# Patient Record
Sex: Female | Born: 1975 | Race: White | Hispanic: No | Marital: Single | Smoking: Former smoker
Health system: Southern US, Community
[De-identification: ages and names within clinical notes are randomized; demographics above are authoritative.]

## PROBLEM LIST (undated history)

## (undated) DIAGNOSIS — I10 Essential (primary) hypertension: Secondary | ICD-10-CM

## (undated) DIAGNOSIS — I509 Heart failure, unspecified: Secondary | ICD-10-CM

## (undated) DIAGNOSIS — E119 Type 2 diabetes mellitus without complications: Secondary | ICD-10-CM

---

## 2001-11-09 ENCOUNTER — Ambulatory Visit (HOSPITAL_COMMUNITY): Admission: RE | Admit: 2001-11-09 | Discharge: 2001-11-09 | Payer: Self-pay | Admitting: General Surgery

## 2011-11-23 ENCOUNTER — Other Ambulatory Visit: Payer: Self-pay | Admitting: Family Medicine

## 2011-11-23 DIAGNOSIS — R109 Unspecified abdominal pain: Secondary | ICD-10-CM

## 2011-11-29 ENCOUNTER — Ambulatory Visit (HOSPITAL_COMMUNITY)
Admission: RE | Admit: 2011-11-29 | Discharge: 2011-11-29 | Disposition: A | Discharge status: Home / Self Care | Payer: Self-pay | Source: Ambulatory Visit | Attending: Family Medicine | Admitting: Family Medicine

## 2011-11-29 DIAGNOSIS — R109 Unspecified abdominal pain: Secondary | ICD-10-CM | POA: Insufficient documentation

## 2011-11-29 DIAGNOSIS — R110 Nausea: Secondary | ICD-10-CM | POA: Insufficient documentation

## 2011-11-30 ENCOUNTER — Other Ambulatory Visit: Payer: Self-pay | Admitting: Family Medicine

## 2011-11-30 DIAGNOSIS — R109 Unspecified abdominal pain: Secondary | ICD-10-CM

## 2011-12-01 ENCOUNTER — Ambulatory Visit (HOSPITAL_COMMUNITY)
Admission: RE | Admit: 2011-12-01 | Discharge: 2011-12-01 | Disposition: A | Discharge status: Home / Self Care | Payer: Self-pay | Source: Ambulatory Visit | Attending: Family Medicine | Admitting: Family Medicine

## 2011-12-01 DIAGNOSIS — R109 Unspecified abdominal pain: Secondary | ICD-10-CM | POA: Insufficient documentation

## 2011-12-01 MED ORDER — SINCALIDE 5 MCG IJ SOLR
INTRAMUSCULAR | Status: AC
  Administered 2011-12-01: 1.59 ug via INTRAVENOUS
  Filled 2011-12-01: qty 5

## 2011-12-01 MED ORDER — TECHNETIUM TC 99M MEBROFENIN IV KIT
5.0000 | PACK | Freq: Once | INTRAVENOUS | Status: AC | PRN
  Administered 2011-12-01: 5 via INTRAVENOUS

## 2013-05-22 ENCOUNTER — Encounter: Payer: Self-pay | Admitting: Family Medicine

## 2013-05-22 ENCOUNTER — Ambulatory Visit (INDEPENDENT_AMBULATORY_CARE_PROVIDER_SITE_OTHER): Payer: Self-pay | Admitting: Family Medicine

## 2013-05-22 VITALS — BP 158/68 | Temp 98.4°F | Ht 69.0 in | Wt 186.0 lb

## 2013-05-22 DIAGNOSIS — L0390 Cellulitis, unspecified: Secondary | ICD-10-CM

## 2013-05-22 DIAGNOSIS — L0291 Cutaneous abscess, unspecified: Secondary | ICD-10-CM

## 2013-05-22 MED ORDER — HYDROCODONE-ACETAMINOPHEN 5-325 MG PO TABS: 1.0000 | ORAL_TABLET | Freq: Four times a day (QID) | ORAL | Status: DC | PRN

## 2013-05-22 MED ORDER — SULFAMETHOXAZOLE-TMP DS 800-160 MG PO TABS: 1.0000 | ORAL_TABLET | Freq: Two times a day (BID) | ORAL | Status: DC

## 2013-05-22 NOTE — Progress Notes (Signed)
   Subjective:    Patient ID: Christie Anderson, female    DOB: [birth year], 37 y.o.   MRN: [id number]  HPI Patient has a boil on the right side of her vagina.   Gets occasional skin infxns and cysts  Came up a couple wks ago, started like an ingrown hair  Turned into more larger  No drainage She said she first noticed it a week ago. It was the size of a pea, now it is the size of an egg.     Review of Systems No fever or chills or discharge    Objective:   Physical Exam  Exam reveals an abscess in the right inferior labial region. Patient was prepped draped anesthetized incised pus was expressed drain inserted      Assessment & Plan:  Impression cellulitis with abscess wound care discussed. Plan hydrocodone when necessary for pain. Bactrim DS twice a day. Symptomatic care discussed. WSL

## 2013-12-08 IMAGING — NM NM HEPATO W/GB/PHARM/[PERSON_NAME]
2 series · 12 of 12 positions shown · non-contrast
Comparison: Ultrasound 11/29/2011

CLINICAL DATA: Abdominal pain

NUCLEAR MEDICINE HEPATOBILIARY IMAGING WITH GALLBLADDER EF
TECHNIQUE: Sequential images of the abdomen were obtained [DATE]
minutes following intravenous administration of
radiopharmaceutical.  After slow intravenous infusion of 1.6 ucg
Cholecystokinin, gallbladder ejection fraction was determined.
Radiopharmaceutical:  5.0 mCi Lc-QQm Choletec

[hida · 3.20mm/px · 6 of 30 frames shown (1 of 2)]
[frame 3/30]
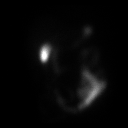
[frame 8/30]
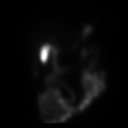
[frame 13/30]
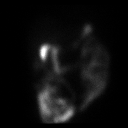
[frame 18/30]
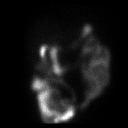
[frame 23/30]
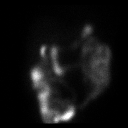
[frame 28/30]
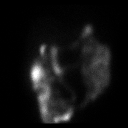

[hida · 3.20mm/px · 6 of 60 frames shown (2 of 2)]
[frame 6/60]
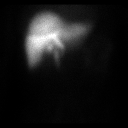
[frame 16/60]
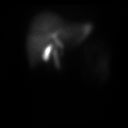
[frame 26/60]
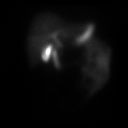
[frame 36/60]
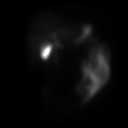
[frame 46/60]
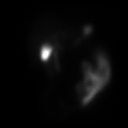
[frame 56/60]
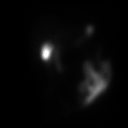

[12 of 12 positions shown; findings below may reference images not displayed]

FINDINGS: There is prompt extraction of radiotracer from the blood
pool and homogeneous uptake within the liver.  The gallbladder is
evident by 15 minutes.  Counts are present within the bowel by 20
minutes.  Upon administration of a cholecystokinin, the gallbladder
contracts appropriately with a calculated ejection fraction = 84%
at 30 min (Normal greater than 30 % ejection).
IMPRESSION: 1. Normal gallbladder ejection fraction =  84%.
2. Patent cystic duct and common bile duct.

## 2014-07-15 ENCOUNTER — Ambulatory Visit (INDEPENDENT_AMBULATORY_CARE_PROVIDER_SITE_OTHER): Payer: Self-pay | Admitting: Family Medicine

## 2014-07-15 ENCOUNTER — Encounter: Payer: Self-pay | Admitting: Family Medicine

## 2014-07-15 VITALS — BP 138/92 | Temp 99.2°F | Ht 69.0 in | Wt 185.0 lb

## 2014-07-15 DIAGNOSIS — J019 Acute sinusitis, unspecified: Secondary | ICD-10-CM

## 2014-07-15 DIAGNOSIS — B9689 Other specified bacterial agents as the cause of diseases classified elsewhere: Secondary | ICD-10-CM

## 2014-07-15 MED ORDER — LEVOFLOXACIN 500 MG PO TABS: 500.0000 mg | ORAL_TABLET | Freq: Every day | ORAL | Status: DC

## 2014-07-15 MED ORDER — SULFAMETHOXAZOLE-TRIMETHOPRIM 800-160 MG PO TABS: 1.0000 | ORAL_TABLET | Freq: Two times a day (BID) | ORAL | Status: DC

## 2014-07-15 NOTE — Progress Notes (Signed)
   Subjective:    Patient ID: Gevena BarreKimberly D Anderson, female    DOB: Oct 02, 1975, 39 y.o.   MRN: [id number]  Cough This is a new problem. Episode onset: 2 weeks ago. Associated symptoms include ear congestion, headaches, myalgias, nasal congestion, rhinorrhea, a sore throat and wheezing. Pertinent negatives include no chest pain, ear pain, fever or shortness of breath. Associated symptoms comments: congestion. Treatments tried: claritin, mucinex, ibuprofen, theraflu.    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  HENT: Positive for congestion, rhinorrhea and sore throat. Negative for ear pain.   Eyes: Negative for discharge.  Respiratory: Positive for cough and wheezing. Negative for shortness of breath.   Cardiovascular: Negative for chest pain.  Musculoskeletal: Positive for myalgias.  Neurological: Positive for headaches.       Objective:   Physical Exam  Constitutional: She appears well-developed.  HENT:  Head: Normocephalic.  Nose: Nose normal.  Mouth/Throat: Oropharynx is clear and moist. No oropharyngeal exudate.  Neck: Neck supple.  Cardiovascular: Normal rate and normal heart sounds.   No murmur heard. Pulmonary/Chest: Effort normal and breath sounds normal. She has no wheezes.  Lymphadenopathy:    She has no cervical adenopathy.  Skin: Skin is warm and dry.  Nursing note and vitals reviewed.         Assessment & Plan:  Viral syndrome with secondary sinusitis antibiotics prescribed. Patient encouraged quit smoking. Antibiotics prescribed she was given 2 different prescriptions she will see which one is more affordable to her I did recommend Levaquin would be the best choice for 14 days if she has ongoing troubles or  problems follow-up

## 2016-07-15 ENCOUNTER — Encounter: Payer: Self-pay | Admitting: Nurse Practitioner

## 2016-07-15 ENCOUNTER — Ambulatory Visit (INDEPENDENT_AMBULATORY_CARE_PROVIDER_SITE_OTHER): Payer: Self-pay | Admitting: Nurse Practitioner

## 2016-07-15 VITALS — BP 136/90 | Temp 98.9°F | Ht 69.0 in | Wt 150.0 lb

## 2016-07-15 DIAGNOSIS — K219 Gastro-esophageal reflux disease without esophagitis: Secondary | ICD-10-CM

## 2016-07-15 DIAGNOSIS — J329 Chronic sinusitis, unspecified: Secondary | ICD-10-CM

## 2016-07-15 MED ORDER — HYDROCODONE-HOMATROPINE 5-1.5 MG/5ML PO SYRP: 5.0000 mL | ORAL_SOLUTION | ORAL | 0 refills | Status: DC | PRN

## 2016-07-15 MED ORDER — AZITHROMYCIN 250 MG PO TABS: ORAL_TABLET | ORAL | 0 refills | Status: DC

## 2016-07-15 NOTE — Patient Instructions (Signed)
Omeprazole 20 mg once a day Ears: mix hydrogen peroxide with warm water equal parts  Flonase Stop Afrin  Food Choices for Gastroesophageal Reflux Disease, Adult When you have gastroesophageal reflux disease (GERD), the foods you eat and your eating habits are very important. Choosing the right foods can help ease the discomfort of GERD. What general guidelines do I need to follow?  Choose fruits, vegetables, whole grains, low-fat dairy products, and low-fat meat, fish, and poultry.  Limit fats such as oils, salad dressings, butter, nuts, and avocado.  Keep a food diary to identify foods that cause symptoms.  Avoid foods that cause reflux. These may be different for different people.  Eat frequent small meals instead of three large meals each day.  Eat your meals slowly, in a relaxed setting.  Limit fried foods.  Cook foods using methods other than frying.  Avoid drinking alcohol.  Avoid drinking large amounts of liquids with your meals.  Avoid bending over or lying down until 2-3 hours after eating. What foods are not recommended? The following are some foods and drinks that may worsen your symptoms: Vegetables  Tomatoes. Tomato juice. Tomato and spaghetti sauce. Chili peppers. Onion and garlic. Horseradish. Fruits  Oranges, grapefruit, and lemon (fruit and juice). Meats  High-fat meats, fish, and poultry. This includes hot dogs, ribs, ham, sausage, salami, and bacon. Dairy  Whole milk and chocolate milk. Sour cream. Cream. Butter. Ice cream. Cream cheese. Beverages  Coffee and tea, with or without caffeine. Carbonated beverages or energy drinks. Condiments  Hot sauce. Barbecue sauce. Sweets/Desserts  Chocolate and cocoa. Donuts. Peppermint and spearmint. Fats and Oils  High-fat foods, including [place] fries and potato chips. Other  Vinegar. Strong spices, such as black pepper, white pepper, red pepper, cayenne, curry powder, cloves, ginger, and chili powder. The  items listed above may not be a complete list of foods and beverages to avoid. Contact your dietitian for more information.  This information is not intended to replace advice given to you by your health care provider. Make sure you discuss any questions you have with your health care provider. Document Released: 05/09/2005 Document Revised: 10/15/2015 Document Reviewed: 03/13/2013 Elsevier Interactive Patient Education  2017 [employer]

## 2016-07-18 ENCOUNTER — Encounter: Payer: Self-pay | Admitting: Nurse Practitioner

## 2016-07-18 ENCOUNTER — Telehealth: Payer: Self-pay | Admitting: Nurse Practitioner

## 2016-07-18 ENCOUNTER — Encounter: Payer: Self-pay | Admitting: Family Medicine

## 2016-07-18 NOTE — Progress Notes (Signed)
Subjective:  Presents for complaints of cough runny nose body aches and headache that began yesterday. Better today. Has had a persistent off-and-on cough since November. Low-grade fever at times, max temp 100.1. Headache mainly with cough. Postnasal drainage. Clear runny nose. Occasional light green mucus. No wheezing. No ear pain. Some epigastric area discomfort times. Occasional alcohol use. Has decreased her caffeine use. Smokes less than one pack per day. No difficulty swallowing. Some acid reflux at times. No excessive NSAID use. No chest pain or shortness of breath.  Objective:   BP 136/90   Temp 98.9 F (37.2 C) (Oral)   Ht 5\' 9"  (1.753 m)   Wt 150 lb (68 kg)   BMI 22.15 kg/m  NAD. Alert, oriented. TMs clear effusion, no erythema. Pharynx injected with green PND noted. Neck supple with mild soft anterior adenopathy. Lungs clear. Heart regular rate rhythm. Abdomen soft nondistended with mild epigastric area tenderness. No rebound or guarding. No obvious masses.  Assessment:  Rhinosinusitis  Gastroesophageal reflux disease without esophagitis    Plan:   Meds ordered this encounter  Medications  . azithromycin (ZITHROMAX Z-PAK) 250 MG tablet    Sig: Take 2 tablets (500 mg) on  Day 1,  followed by 1 tablet (250 mg) once daily on Days 2 through 5.    Dispense:  6 each    Refill:  0    Order Specific Question:   Supervising Provider    Answer:   Merlyn AlbertLUKING, WILLIAM S [[id number]]  . HYDROcodone-homatropine (HYCODAN) 5-1.5 MG/5ML syrup    Sig: Take 5 mLs by mouth every 4 (four) hours as needed.    Dispense:  120 mL    Refill:  0    Order Specific Question:   Supervising Provider    Answer:   Merlyn AlbertLUKING, WILLIAM S [[id number]]   Patient is uninsured, defers chest x-ray at this time. Start OTC omeprazole daily. Given information on dietary measures. Call back in 2 weeks if cough persist, sooner if worse. Warning signs reviewed. Consider referral to GI if reflux symptoms persist. Discussed importance of  smoking cessation.

## 2016-07-18 NOTE — Telephone Encounter (Signed)
Pt called stating that the hycodan was too expensive to fill and wants to know if tussinex cream can be called in.    [facility] [place]

## 2016-07-18 NOTE — Telephone Encounter (Signed)
Patient stopped in to check on this message.  She said she needs this tonight.

## 2016-07-20 NOTE — Telephone Encounter (Signed)
Nurses: Please ask the doctor's for assistance since this will need to be printed. I would have thought Hycodan would be cheaper and I hope that Tussinex "cream" is a typo. Thanks! Eber Jonesarolyn

## 2016-07-22 MED ORDER — HYDROCOD POLST-CPM POLST ER 10-8 MG/5ML PO SUER: ORAL | 0 refills | Status: DC

## 2016-07-22 NOTE — Telephone Encounter (Signed)
Notified patient script ready for pick up. 

## 2016-07-22 NOTE — Telephone Encounter (Signed)
If the patient still once the prescription Tussionex 1 teaspoon twice a day when necessary cough, 3 ounces, caution drowsiness home use only

## 2016-09-02 ENCOUNTER — Ambulatory Visit (INDEPENDENT_AMBULATORY_CARE_PROVIDER_SITE_OTHER): Payer: Self-pay | Admitting: Family Medicine

## 2016-09-02 ENCOUNTER — Encounter: Payer: Self-pay | Admitting: Family Medicine

## 2016-09-02 VITALS — BP 134/90 | Temp 99.7°F | Wt 139.0 lb

## 2016-09-02 DIAGNOSIS — J111 Influenza due to unidentified influenza virus with other respiratory manifestations: Secondary | ICD-10-CM

## 2016-09-02 MED ORDER — AZITHROMYCIN 250 MG PO TABS: ORAL_TABLET | ORAL | 0 refills | Status: DC

## 2016-09-02 MED ORDER — HYDROCODONE-HOMATROPINE 5-1.5 MG/5ML PO SYRP: 5.0000 mL | ORAL_SOLUTION | ORAL | 0 refills | Status: DC | PRN

## 2016-09-02 NOTE — Patient Instructions (Signed)

## 2016-09-02 NOTE — Progress Notes (Signed)
   Subjective:    Patient ID: Christie Anderson, female    DOB: 18-Jan-1976, 41 y.o.   MRN: [id number]  Fever   This is a new problem. The current episode started in the past 7 days. The maximum temperature noted was 101 to 101.9 F. Associated symptoms include congestion, coughing, diarrhea, muscle aches and a sore throat. Treatments tried: Tamiflu    Fairly quick onset of fever headache body aches sinus congestion sore throat not feeling good hit her on Wednesday night patient does not one to be on Tamiflu   Review of Systems  Constitutional: Positive for fever.  HENT: Positive for congestion and sore throat.   Respiratory: Positive for cough.   Gastrointestinal: Positive for diarrhea.       Objective:   Physical Exam  Constitutional: She appears well-developed.  HENT:  Head: Normocephalic.  Nose: Nose normal.  Mouth/Throat: Oropharynx is clear and moist. No oropharyngeal exudate.  Neck: Neck supple.  Cardiovascular: Normal rate and normal heart sounds.   No murmur heard. Pulmonary/Chest: Effort normal and breath sounds normal. No respiratory distress. She has no wheezes. She has no rales.  Lymphadenopathy:    She has no cervical adenopathy.  Skin: Skin is warm and dry.  Nursing note and vitals reviewed.  Patient relates some sinus congestion       Assessment & Plan:  Influenza-the patient was diagnosed with influenza. Patient/family educated about the flu and warning signs to watch for. If difficulty breathing, severe neck pain and stiffness, cyanosis, disorientation, or progressive worsening then immediately get rechecked at that ER. If progressive symptoms be certain to be rechecked. Supportive measures such as Tylenol/ibuprofen was discussed. No aspirin use in children. And influenza home care instruction sheet was given. Patient opted not to be on Tamiflu Prescription cough medicine given Prescription for antibiotics given should sinus symptoms get worse. Follow-up if any  other warning signs occur

## 2016-11-25 ENCOUNTER — Telehealth: Payer: Self-pay | Admitting: *Deleted

## 2016-11-25 NOTE — Telephone Encounter (Signed)
Patient called with c/o elevated blood sugar-patient is not currently diabetic. Patient states she has lost 45-50 lbs over last 6 months and is having excessive thirst and frequent urination. Patient states her sugar running [phone] this week. Consult with Dr Lorin PicketScott. Dr Lorin PicketScott recommends patient go straight to the ER for evaluation and treatment due to risk of diabetic ketoacidosis. Patient verbalized understanding.

## 2018-02-12 ENCOUNTER — Encounter: Payer: Self-pay | Admitting: Family Medicine

## 2018-02-12 ENCOUNTER — Ambulatory Visit: Payer: Self-pay | Admitting: Family Medicine

## 2018-02-12 VITALS — Temp 98.3°F | Wt 136.6 lb

## 2018-02-12 DIAGNOSIS — J019 Acute sinusitis, unspecified: Secondary | ICD-10-CM

## 2018-02-12 DIAGNOSIS — B9689 Other specified bacterial agents as the cause of diseases classified elsewhere: Secondary | ICD-10-CM

## 2018-02-12 MED ORDER — ACYCLOVIR 800 MG PO TABS: 800.0000 mg | ORAL_TABLET | Freq: Four times a day (QID) | ORAL | 0 refills | Status: AC

## 2018-02-12 MED ORDER — LEVOFLOXACIN 500 MG PO TABS: 500.0000 mg | ORAL_TABLET | Freq: Every day | ORAL | 0 refills | Status: DC

## 2018-02-12 NOTE — Progress Notes (Signed)
   Subjective:    Patient ID: Gevena BarreKimberly D Tiggs, female    DOB: 1975-06-18, 42 y.o.   MRN: [id number]  Sinus Problem  This is a new problem. The current episode started 1 to 4 weeks ago. Associated symptoms include congestion, coughing, headaches, sinus pressure and a sore throat. Pertinent negatives include no ear pain or shortness of breath. Treatments tried: thera flu.  Head congestion drainage coughing patient is a smoker she knows she needs to quit over the past week sinus pressure pain discomfort teeth hurt aches in the face  PMH benign  Review of Systems  Constitutional: Negative for activity change and fever.  HENT: Positive for congestion, rhinorrhea, sinus pressure and sore throat. Negative for ear pain.   Eyes: Negative for discharge.  Respiratory: Positive for cough. Negative for shortness of breath and wheezing.   Cardiovascular: Negative for chest pain.  Neurological: Positive for headaches.       Objective:   Physical Exam  Constitutional: She appears well-developed.  HENT:  Head: Normocephalic.  Nose: Nose normal.  Mouth/Throat: Oropharynx is clear and moist. No oropharyngeal exudate.  Neck: Neck supple.  Cardiovascular: Normal rate and normal heart sounds.  No murmur heard. Pulmonary/Chest: Effort normal and breath sounds normal. She has no wheezes.  Lymphadenopathy:    She has no cervical adenopathy.  Skin: Skin is warm and dry.  Nursing note and vitals reviewed.         Assessment & Plan:  Sinusitis May use decongestant nasal spray for a few days Antibiotics prescribed If progressive troubles or worse call back may need a refill on antibiotics Follow-up sooner problems

## 2018-02-16 ENCOUNTER — Telehealth: Payer: Self-pay | Admitting: Family Medicine

## 2018-02-16 ENCOUNTER — Other Ambulatory Visit: Payer: Self-pay | Admitting: Family Medicine

## 2018-02-16 MED ORDER — CLINDAMYCIN HCL 300 MG PO CAPS: ORAL_CAPSULE | ORAL | 0 refills | Status: DC

## 2018-02-16 NOTE — Telephone Encounter (Signed)
Clindamycin 300 mg 1 3 times daily, #21, if ongoing trouble notify us I would recommend over-the-counter cough medicines Cough is there for a reason no cough medication will totally take it away Typically we try to avoid narcotic cough medicines

## 2018-02-16 NOTE — Telephone Encounter (Signed)
Patient states she is still having symptoms of a cough,sinus drainage,sneezing. Was dx with a severe sinus infection on Monday and was give antibx,and told if no better by today for her to call us and we would send in something else. She has taken the last pill today of the antibiotic.  No fever. Please advise.

## 2018-02-16 NOTE — Telephone Encounter (Signed)
Pt was seen in office on 02/12/18 she was given levofloxacin (LEVAQUIN) 500 MG tablet. She is hoping a refill of that could be called in and also a cough syrup. Her symptoms have not improved and she was told to call if she needed a refill on antibiotic. Please send both to [facility] - [place], [place] - [address]

## 2018-02-16 NOTE — Telephone Encounter (Signed)
Medication sent to pharmacy and patient is aware  

## 2023-11-13 ENCOUNTER — Telehealth: Payer: Self-pay | Admitting: Family Medicine

## 2023-11-13 ENCOUNTER — Ambulatory Visit: Payer: Self-pay

## 2023-11-13 NOTE — Telephone Encounter (Unsigned)
 Copied from CRM [id number]. Topic: Clinical - Medical Advice >> Nov 13, 2023  4:26 PM Tiffini S wrote: Reason for CRM: Patient called asking to be scheduled with Dr. Glendia Fielding. Explained that she was last seen in 2018/ 2019 and offered to schedule with Charmaine Grooms. Patient refused asking for a call from the office as she had a conversation with the PCP that she could return as his patient.  Patient is having issues with her cell phone [phone]  (cannot dial out/ dropped calls) she asked to be called on her son's cell number at [phone] if not contact is made on her phone number.

## 2023-11-13 NOTE — Telephone Encounter (Addendum)
 Patient requesting to be reassigned back to Dr. Alphonsa. See telephone encounter.   1st attempt, mailbox is full.  Note: needs to reestablish  Copied from CRM [id number]. Topic: Clinical - Pink Word Triage >> Nov 13, 2023  4:22 PM Selinda RAMAN wrote: Reason for Triage: The patient called in stating she has had a difficult time walking and with stability lately. She has been dealing with some depression and has had really bad weakness. Her phone disconnected twice and this is why this was made into a [employer] pink word CRM. Please assist patient ASAP.

## 2023-11-14 ENCOUNTER — Ambulatory Visit (INDEPENDENT_AMBULATORY_CARE_PROVIDER_SITE_OTHER): Payer: Self-pay | Admitting: Physician Assistant

## 2023-11-14 VITALS — BP 150/88 | HR 97 | Temp 98.6°F | Ht 69.0 in | Wt 143.8 lb

## 2023-11-14 DIAGNOSIS — R29898 Other symptoms and signs involving the musculoskeletal system: Secondary | ICD-10-CM

## 2023-11-14 DIAGNOSIS — Z7689 Persons encountering health services in other specified circumstances: Secondary | ICD-10-CM

## 2023-11-14 DIAGNOSIS — R292 Abnormal reflex: Secondary | ICD-10-CM

## 2023-11-14 DIAGNOSIS — R601 Generalized edema: Secondary | ICD-10-CM

## 2023-11-14 DIAGNOSIS — R159 Full incontinence of feces: Secondary | ICD-10-CM

## 2023-11-14 NOTE — Progress Notes (Unsigned)
 New Patient Office Visit  Subjective    Patient ID: Christie Anderson, female    DOB: 02-27-1976  Age: 48 y.o. MRN: [id number]  CC: No chief complaint on file.   HPI Christie Anderson presents to establish care ***  Outpatient Encounter Medications as of 11/14/2023  Medication Sig   [DISCONTINUED] azithromycin  (ZITHROMAX  Z-PAK) 250 MG tablet Take 2 tablets (500 mg) on  Day 1,  followed by 1 tablet (250 mg) once daily on Days 2 through 5. (Patient not taking: Reported on 11/14/2023)   [DISCONTINUED] clindamycin  (CLEOCIN ) 300 MG capsule Take one capsule 3 times a day (Patient not taking: Reported on 11/14/2023)   [DISCONTINUED] HYDROcodone -homatropine (HYCODAN) 5-1.5 MG/5ML syrup Take 5 mLs by mouth every 4 (four) hours as needed. (Patient not taking: Reported on 11/14/2023)   [DISCONTINUED] levofloxacin  (LEVAQUIN ) 500 MG tablet Take 1 tablet (500 mg total) by mouth daily. (Patient not taking: Reported on 11/14/2023)   No facility-administered encounter medications on file as of 11/14/2023.    No past medical history on file.  No past surgical history on file.  No family history on file.  Social History   Socioeconomic History   Marital status: Single    Spouse name: Not on file   Number of children: Not on file   Years of education: Not on file   Highest education level: Not on file  Occupational History   Not on file  Tobacco Use   Smoking status: Every Day   Smokeless tobacco: Never  Substance and Sexual Activity   Alcohol use: Not on file   Drug use: Not on file   Sexual activity: Not on file  Other Topics Concern   Not on file  Social History Narrative   Not on file   Social Drivers of Health   Financial Resource Strain: Not on file  Food Insecurity: Not on file  Transportation Needs: Not on file  Physical Activity: Not on file  Stress: Not on file  Social Connections: Not on file  Intimate Partner Violence: Not on file    ROS      Objective    BP (!)  150/88   Pulse 97   Temp 98.6 F (37 C)   Ht 5' 9 (1.753 m)   Wt 143 lb 12.8 oz (65.2 kg)   SpO2 98%   BMI 21.24 kg/m   Physical Exam  Neurological:     Gait: Gait abnormal.     Deep Tendon Reflexes: Reflexes abnormal.       Assessment & Plan:  Encounter to establish care  Weakness of both lower extremities Assessment & Plan: Patient presents today with 3 months of lower extremity muscle weakness, specifically proximal weakness, absent deep tendon reflexes, fecal incontinence, and, anasarca specifically of lower extremities and abdomen. Patient 30+ year smoking history. 2+ muscle strength of lower extremities, pitting edema of bilateral lower extremities, normal capillary refill, abnormal gait secondary to weakness, normal heart and lung sounds, no shifting dullness on abdominal exam. Broad differential to include Guillan Barre syndrome, MS, peripheral neuropathy, Cindie Evangelist Syndrome, and malignancy. Significant lab work ordered today. Advised chest and lumbar, however patient defers as this time as she in uninsured. Discussed concerning nature of symptoms and high index of suspicion for malignancy with patient, however would only like to proceed with labs at this time. Discussed potential referral to neurology. Advised patient to seek care in the ER for worsening symptoms, increased incontinence, paralysis, or new pain. Handicap placard  application filled out and proved to patient today.   Orders: -     CBC with Differential/Platelet -     Comprehensive metabolic panel with GFR -     Lipid panel -     Sedimentation rate -     C-reactive protein -     CK -     TSH + free T4 -     Vitamin B12  Decreased reflex of lower extremity -     CBC with Differential/Platelet -     Comprehensive metabolic panel with GFR -     Lipid panel -     Sedimentation rate -     C-reactive protein -     CK -     TSH + free T4 -     Vitamin B12  Anasarca  Incontinence of feces,  unspecified fecal incontinence type    No follow-ups on file.   Charmaine Demarri Elie, PA-C

## 2023-11-14 NOTE — Assessment & Plan Note (Signed)
 Patient presents today with 3 months of lower extremity muscle weakness, specifically proximal weakness, absent deep tendon reflexes, fecal incontinence, and, anasarca specifically of lower extremities and abdomen. Patient 30+ year smoking history. 2+ muscle strength of lower extremities, pitting edema of bilateral lower extremities, normal capillary refill, abnormal gait secondary to weakness, normal heart and lung sounds, no shifting dullness on abdominal exam. Broad differential to include Guillan Barre syndrome, MS/demyelination, Cauda Equina, peripheral neuropathy, Cindie Evangelist Syndrome, and malignancy. Significant lab work ordered today. Advised chest and lumbar XR, however patient defers as this time as she in uninsured. Discussed concerning nature of symptoms and high index of suspicion for malignancy with patient, however would only like to proceed with labs at this time. Discussed potential referral to neurology. Advised patient to seek care in the ER for worsening symptoms, increased incontinence, paralysis, or new pain. Handicap placard application filled out and proved to patient today.   Case discussed with supervising physician, Dr. Bluford. Patient seen and evaluated by Dr. Bluford.

## 2023-11-15 ENCOUNTER — Ambulatory Visit: Payer: Self-pay | Admitting: Physician Assistant

## 2023-11-15 ENCOUNTER — Encounter: Payer: Self-pay | Admitting: Physician Assistant

## 2023-11-15 LAB — TSH+FREE T4
Free T4: 1.06 ng/dL (ref 0.82–1.77)
TSH: 4.05 u[IU]/mL (ref 0.450–4.500)

## 2023-11-15 LAB — CBC WITH DIFFERENTIAL/PLATELET
Basophils Absolute: 0.1 10*3/uL (ref 0.0–0.2)
Basos: 1 %
EOS (ABSOLUTE): 0 10*3/uL (ref 0.0–0.4)
Eos: 0 %
Hematocrit: 50.5 % — ABNORMAL HIGH (ref 34.0–46.6)
Hemoglobin: 16.6 g/dL — ABNORMAL HIGH (ref 11.1–15.9)
Immature Grans (Abs): 0 10*3/uL (ref 0.0–0.1)
Immature Granulocytes: 0 %
Lymphocytes Absolute: 1.7 10*3/uL (ref 0.7–3.1)
Lymphs: 17 %
MCH: 31.9 pg (ref 26.6–33.0)
MCHC: 32.9 g/dL (ref 31.5–35.7)
MCV: 97 fL (ref 79–97)
Monocytes Absolute: 0.5 10*3/uL (ref 0.1–0.9)
Monocytes: 5 %
Neutrophils Absolute: 7.6 10*3/uL — ABNORMAL HIGH (ref 1.4–7.0)
Neutrophils: 77 %
Platelets: 238 10*3/uL (ref 150–450)
RBC: 5.2 x10E6/uL (ref 3.77–5.28)
RDW: 12.1 % (ref 11.7–15.4)
WBC: 9.9 10*3/uL (ref 3.4–10.8)

## 2023-11-15 LAB — CK: Total CK: 49 U/L (ref 32–182)

## 2023-11-15 LAB — LIPID PANEL
Chol/HDL Ratio: 6 ratio — ABNORMAL HIGH (ref 0.0–4.4)
Cholesterol, Total: 379 mg/dL — ABNORMAL HIGH (ref 100–199)
HDL: 63 mg/dL (ref >39)
LDL Chol Calc (NIH): 215 mg/dL — ABNORMAL HIGH (ref 0–99)
Triglycerides: 468 mg/dL — ABNORMAL HIGH (ref 0–149)
VLDL Cholesterol Cal: 101 mg/dL — ABNORMAL HIGH (ref 5–40)

## 2023-11-15 LAB — VITAMIN B12: Vitamin B-12: 487 pg/mL (ref 232–1245)

## 2023-11-15 LAB — SEDIMENTATION RATE: Sed Rate: 14 mm/h (ref 0–32)

## 2023-11-16 ENCOUNTER — Other Ambulatory Visit: Payer: Self-pay | Admitting: Physician Assistant

## 2023-11-16 DIAGNOSIS — R29898 Other symptoms and signs involving the musculoskeletal system: Secondary | ICD-10-CM

## 2023-11-17 ENCOUNTER — Other Ambulatory Visit (INDEPENDENT_AMBULATORY_CARE_PROVIDER_SITE_OTHER): Payer: Self-pay

## 2023-11-18 LAB — COMPREHENSIVE METABOLIC PANEL WITH GFR
ALT: 32 IU/L (ref 0–32)
AST: 18 IU/L (ref 0–40)
Albumin: 3.5 g/dL — ABNORMAL LOW (ref 3.9–4.9)
Alkaline Phosphatase: 128 IU/L — ABNORMAL HIGH (ref 44–121)
BUN/Creatinine Ratio: 18 (ref 9–23)
BUN: 10 mg/dL (ref 6–24)
Bilirubin Total: 0.2 mg/dL (ref 0.0–1.2)
CO2: 23 mmol/L (ref 20–29)
Calcium: 9 mg/dL (ref 8.7–10.2)
Chloride: 93 mmol/L — ABNORMAL LOW (ref 96–106)
Creatinine, Ser: 0.55 mg/dL — ABNORMAL LOW (ref 0.57–1.00)
Globulin, Total: 2.8 g/dL (ref 1.5–4.5)
Glucose: 456 mg/dL — ABNORMAL HIGH (ref 70–99)
Potassium: 4.3 mmol/L (ref 3.5–5.2)
Sodium: 131 mmol/L — ABNORMAL LOW (ref 134–144)
Total Protein: 6.3 g/dL (ref 6.0–8.5)
eGFR: 112 mL/min/{1.73_m2} (ref >59)

## 2023-11-20 ENCOUNTER — Ambulatory Visit: Payer: Self-pay | Admitting: Physician Assistant

## 2023-11-23 ENCOUNTER — Encounter: Payer: Self-pay | Admitting: Physician Assistant

## 2023-11-23 ENCOUNTER — Telehealth: Payer: Self-pay | Admitting: Physician Assistant

## 2023-11-23 DIAGNOSIS — R29898 Other symptoms and signs involving the musculoskeletal system: Secondary | ICD-10-CM

## 2023-11-23 DIAGNOSIS — R7309 Other abnormal glucose: Secondary | ICD-10-CM

## 2023-11-23 DIAGNOSIS — R531 Weakness: Secondary | ICD-10-CM

## 2023-11-23 NOTE — Progress Notes (Signed)
   Virtual Visit via Video Note  I connected with Christie Anderson on 11/23/23 at 10:00 AM EDT by a video enabled telemedicine application and verified that I am speaking with the correct person using two identifiers.  Patient Location: Home Provider Location: Office/Clinic  I discussed the limitations, risks, security, and privacy concerns of performing an evaluation and management service by video and the availability of in person appointments. I also discussed with the patient that there may be a patient responsible charge related to this service. The patient expressed understanding and agreed to proceed.  Subjective: PCP: Rosalea Withrow, PA-C  No chief complaint on file.  Patient presents today for follow up regarding lab work and bilateral proximal leg weakness.She reports symptoms are unchanged since her visit las week. She denies worsening bladder or bowel incontinence. Denies worsening sensory deficits. No changes in pain. She does endorse family history of diabetes in her mother. She denies symptomatology today.      ROS: Per HPI No current outpatient medications on file.  Observations/Objective: There were no vitals filed for this visit. Physical Exam Constitutional:      Appearance: She is ill-appearing.  Eyes:     Extraocular Movements: Extraocular movements intact.  Pulmonary:     Effort: Pulmonary effort is normal.  Musculoskeletal:     Cervical back: Normal range of motion.  Neurological:     General: No focal deficit present.     Mental Status: She is alert.  Psychiatric:        Behavior: Behavior is cooperative.     Assessment and Plan: Weakness of both lower extremities Assessment & Plan: Symptoms unchanged from her appointment 1 week ago. Discussed referral to neurology for further workup. Advised chest x-ray due to smoking history and concern for malignancy. Patient okay with referral but would like to hold off on imaging as she is not interested in  managing malignancy if noted on chest x-ray. Discussed warning signs and ER precautions.   Orders: -     DG Chest 2 View -     Ambulatory referral to Neurology  Elevated glucose Assessment & Plan: Discussed elevated glucose level with patient. A1c for further evaluation. Discussed treatment options including metformin. Patient interested in other treatment options as her mother was unable to tolerate metformin due to adverse effects. Will discuss treatment options with patient once A1c is resulted. Advised dietary changes to include decreased sugar and carbohydrate intake. She is unable to participate in regular physical activity secondary to leg weakness.   Orders: -     Hemoglobin A1c    Follow Up Instructions: No follow-ups on file.   I discussed the assessment and treatment plan with the patient. The patient was provided an opportunity to ask questions, and all were answered. The patient agreed with the plan and demonstrated an understanding of the instructions.   The patient was advised to call back or seek an in-person evaluation if the symptoms worsen or if the condition fails to improve as anticipated.  The above assessment and management plan was discussed with the patient. The patient verbalized understanding of and has agreed to the management plan.   Charmaine Ellan Tess, PA-C

## 2023-11-23 NOTE — Assessment & Plan Note (Signed)
 Discussed elevated glucose level with patient. A1c for further evaluation. Discussed treatment options including metformin. Patient interested in other treatment options as her mother was unable to tolerate metformin due to adverse effects. Will discuss treatment options with patient once A1c is resulted. Advised dietary changes to include decreased sugar and carbohydrate intake. She is unable to participate in regular physical activity secondary to leg weakness.

## 2023-11-23 NOTE — Assessment & Plan Note (Addendum)
 Symptoms unchanged from her appointment 1 week ago. Discussed referral to neurology for further workup. Advised chest x-ray due to smoking history and concern for malignancy. Patient okay with referral but would like to hold off on imaging as she is not interested in managing malignancy if noted on chest x-ray. Discussed warning signs and ER precautions.

## 2023-11-27 ENCOUNTER — Other Ambulatory Visit (INDEPENDENT_AMBULATORY_CARE_PROVIDER_SITE_OTHER): Payer: Self-pay

## 2023-11-29 ENCOUNTER — Ambulatory Visit (HOSPITAL_COMMUNITY)
Admission: RE | Admit: 2023-11-29 | Discharge: 2023-11-29 | Disposition: A | Discharge status: Home / Self Care | Payer: Self-pay | Source: Ambulatory Visit | Attending: Physician Assistant | Admitting: Physician Assistant

## 2023-11-29 DIAGNOSIS — R29898 Other symptoms and signs involving the musculoskeletal system: Secondary | ICD-10-CM | POA: Insufficient documentation

## 2023-11-30 ENCOUNTER — Encounter: Payer: Self-pay | Admitting: Neurology

## 2023-11-30 LAB — HEMOGLOBIN A1C
Est. average glucose Bld gHb Est-mCnc: 367 mg/dL
Hgb A1c MFr Bld: 14.4 % — ABNORMAL HIGH (ref 4.8–5.6)

## 2023-12-04 ENCOUNTER — Ambulatory Visit: Payer: Self-pay | Admitting: Physician Assistant

## 2023-12-04 ENCOUNTER — Telehealth: Payer: Self-pay

## 2023-12-04 NOTE — Telephone Encounter (Signed)
 Critical imaging results from [facility] for chest x ray. Please advise

## 2023-12-04 NOTE — Telephone Encounter (Signed)
 Left a message for pt to return call to schedule patient as indicated in chart

## 2023-12-06 NOTE — Telephone Encounter (Signed)
 Spoke with pt , a virtual appt has been scheduled for follow up of her chest x ray.

## 2023-12-08 ENCOUNTER — Telehealth: Payer: Self-pay | Admitting: Physician Assistant

## 2023-12-08 ENCOUNTER — Encounter: Payer: Self-pay | Admitting: Physician Assistant

## 2023-12-08 DIAGNOSIS — E1165 Type 2 diabetes mellitus with hyperglycemia: Secondary | ICD-10-CM

## 2023-12-08 DIAGNOSIS — R9389 Abnormal findings on diagnostic imaging of other specified body structures: Secondary | ICD-10-CM

## 2023-12-08 DIAGNOSIS — Z794 Long term (current) use of insulin: Secondary | ICD-10-CM

## 2023-12-08 MED ORDER — PEN NEEDLES 30G X 8 MM MISC: 1 refills | Status: DC

## 2023-12-08 MED ORDER — INSULIN LISPRO 100 UNIT/ML IJ SOLN: 15.0000 [IU] | Freq: Every day | INTRAMUSCULAR | 2 refills | Status: DC

## 2023-12-08 NOTE — Progress Notes (Signed)
 Virtual Visit via Video Note  I connected with Christie Anderson on 12/08/23 at  3:40 PM EDT by a video enabled telemedicine application and verified that I am speaking with the correct person using two identifiers.  Patient Location: Home Provider Location: Office/Clinic  I discussed the limitations, risks, security, and privacy concerns of performing an evaluation and management service by video and the availability of in person appointments. I also discussed with the patient that there may be a patient responsible charge related to this service. The patient expressed understanding and agreed to proceed.  Subjective: PCP: Prentiss Polio, PA-C  No chief complaint on file.  Patient presents today for follow up regarding elevated A1c and abnormal chest x-ray. Patient reports fall recently secondary to muscle weakness. Denies injuries, endorsing generalized soreness. Last A1c elevated at 14%. Family history of diabetes. Patient wishes to avoid metformin due to side effects she witnessed in her mother. She reports low sugar diet, stating she often avoid desserts and sugary beverages considering her family history. Recent chest x-ray reveal bilateral basial pleural effusion, reticular opacities throughout, and ovoid hyperdense nodule overlying the left upper lung. Patient is an avid smoker. She reports she is not interested in further imaging at this time. Patient has upcoming neurology appointment at the end of the month for evaluation of leg weakness. Patient reports she is perusing disability and wanted to make provider aware in case she has paperwork to be filled out.    ROS: Per HPI  Current Outpatient Medications:    insulin lispro (HUMALOG) 100 UNIT/ML injection, Inject 0.15 mLs (15 Units total) into the skin daily., Disp: 10 mL, Rfl: 2   Insulin Pen Needle (PEN NEEDLES) 30G X 8 MM MISC, Use 1 needle per injection, Disp: 100 each, Rfl: 1  Observations/Objective: There were no vitals  filed for this visit. Physical Exam Constitutional:      Appearance: Normal appearance.     Comments: Generalized facial swelling  Eyes:     Extraocular Movements: Extraocular movements intact.  Pulmonary:     Effort: Pulmonary effort is normal.  Musculoskeletal:     Cervical back: Normal range of motion.  Neurological:     General: No focal deficit present.     Mental Status: She is alert.  Psychiatric:        Mood and Affect: Mood normal.        Thought Content: Thought content normal.     Assessment and Plan: Type 2 diabetes mellitus with hyperglycemia, with long-term current use of insulin (HCC) Assessment & Plan: Discussed A1c and treatment options with patient. Starting basal insulin at bed time. 15 u daily. Discussed dietary changes to include increased whole foods, protein, and decreased sugar intake. Exercise capacity limited secondary to leg weakness. Educated on insulin pen and use. Patient to follow up in 3 months for repeat A1c.   Orders: -     Insulin Lispro; Inject 0.15 mLs (15 Units total) into the skin daily.  Dispense: 10 mL; Refill: 2 -     Pen Needles; Use 1 needle per injection  Dispense: 100 each; Refill: 1  Abnormal chest x-ray Assessment & Plan: Discussed chest x-ray findings with patient. High concern for malignancy remains. Patient declines CT imaging at this time due to concern for cost, and mentions if she does have cancer she does not want treatment. Patient agreeable to repeat chest x-ray, however states it may be some time before she is able to get this done due  to mobility and transportation. Denies shortness of breath or other pulmonary symptoms. She does smoke regularly. We have discussed smoking cessation in the past.   Orders: -     DG Chest 2 View    Follow Up Instructions: Return in about 3 months (around 03/09/2024).   I discussed the assessment and treatment plan with the patient. The patient was provided an opportunity to ask  questions, and all were answered. The patient agreed with the plan and demonstrated an understanding of the instructions.   The patient was advised to call back or seek an in-person evaluation if the symptoms worsen or if the condition fails to improve as anticipated.  The above assessment and management plan was discussed with the patient. The patient verbalized understanding of and has agreed to the management plan.   Charmaine Jaqua Ching, PA-C

## 2023-12-08 NOTE — Assessment & Plan Note (Signed)
 Discussed chest x-ray findings with patient. High concern for malignancy remains. Patient declines CT imaging at this time due to concern for cost, and mentions if she does have cancer she does not want treatment. Patient agreeable to repeat chest x-ray, however states it may be some time before she is able to get this done due to mobility and transportation. Denies shortness of breath or other pulmonary symptoms. She does smoke regularly. We have discussed smoking cessation in the past.

## 2023-12-08 NOTE — Assessment & Plan Note (Signed)
 Discussed A1c and treatment options with patient. Starting basal insulin at bed time. 15 u daily. Discussed dietary changes to include increased whole foods, protein, and decreased sugar intake. Exercise capacity limited secondary to leg weakness. Educated on insulin pen and use. Patient to follow up in 3 months for repeat A1c.

## 2023-12-12 ENCOUNTER — Other Ambulatory Visit: Payer: Self-pay | Admitting: Physician Assistant

## 2023-12-12 DIAGNOSIS — E1165 Type 2 diabetes mellitus with hyperglycemia: Secondary | ICD-10-CM

## 2023-12-12 MED ORDER — INSULIN PEN NEEDLE 30G X 8 MM MISC: 1.0000 | Freq: Every day | 99 refills | Status: DC

## 2023-12-13 ENCOUNTER — Telehealth: Payer: Self-pay | Admitting: Physician Assistant

## 2023-12-13 ENCOUNTER — Other Ambulatory Visit: Payer: Self-pay | Admitting: Physician Assistant

## 2023-12-13 DIAGNOSIS — E1165 Type 2 diabetes mellitus with hyperglycemia: Secondary | ICD-10-CM

## 2023-12-13 MED ORDER — INSULIN SYRINGE 30G X 5/16" 0.5 ML MISC: 1 refills | Status: AC

## 2023-12-13 NOTE — Telephone Encounter (Signed)
 Pt requesting insulin  syringes not found on medication list.    Copied from CRM #[id number]. Topic: Clinical - Medication Refill >> Dec 13, 2023  9:34 AM Turkey B wrote: Medication: pt needs syringes for her insulin  instead of the pens   Has the patient contacted their pharmacy? yes   (Agent: If yes, when and what did the pharmacy advise?)pharmacy called in directly   This is the patient's preferred pharmacy:    [facility] - [place], [place] [address] [place] [address] [place] [address] Phone: [phone] Fax: [phone]   Is this the correct pharmacy for this prescription? yes .    Has the prescription been filled recently? no   Is the patient out of the medication? Has some for now he paid out of pocket at pharmacy   Has the patient been seen for an appointment in the last year OR does the patient have an upcoming appointment? yes   Can we respond through MyChart? yes   Agent: Please be advised that Rx refills may take up to 3 business days. We ask that you follow-up with your pharmacy.

## 2023-12-13 NOTE — Telephone Encounter (Unsigned)
 Copied from CRM #[id number]. Topic: Clinical - Medication Refill >> Dec 13, 2023  9:34 AM Turkey B wrote: Medication: pt needs syringes for her insulin  instead of the pens  Has the patient contacted their pharmacy? yes  (Agent: If yes, when and what did the pharmacy advise?)pharmacy called in directly  This is the patient's preferred pharmacy:   [facility] - [place], [place] [address] [place] [address] [place] [address] Phone: [phone] Fax: [phone]  Is this the correct pharmacy for this prescription? yes .   Has the prescription been filled recently? no  Is the patient out of the medication? Has some for now he paid out of pocket at pharmacy  Has the patient been seen for an appointment in the last year OR does the patient have an upcoming appointment? yes  Can we respond through MyChart? yes  Agent: Please be advised that Rx refills may take up to 3 business days. We ask that you follow-up with your pharmacy.

## 2023-12-18 ENCOUNTER — Telehealth: Payer: Self-pay | Admitting: Physician Assistant

## 2023-12-18 ENCOUNTER — Encounter: Payer: Self-pay | Admitting: Physician Assistant

## 2023-12-18 DIAGNOSIS — E1165 Type 2 diabetes mellitus with hyperglycemia: Secondary | ICD-10-CM

## 2023-12-18 DIAGNOSIS — Z794 Long term (current) use of insulin: Secondary | ICD-10-CM

## 2023-12-18 DIAGNOSIS — R29898 Other symptoms and signs involving the musculoskeletal system: Secondary | ICD-10-CM

## 2023-12-18 MED ORDER — INSULIN GLARGINE 100 UNIT/ML ~~LOC~~ SOLN: 15.0000 [IU] | Freq: Every day | SUBCUTANEOUS | 2 refills | Status: DC

## 2023-12-18 MED ORDER — INSULIN LISPRO 100 UNIT/ML IJ SOLN: 5.0000 [IU] | Freq: Three times a day (TID) | INTRAMUSCULAR | 1 refills | Status: DC

## 2023-12-18 NOTE — Assessment & Plan Note (Signed)
 Patient reports leg weakness has worsened in the last week. She is scheduled to see neurology tomorrow, highly encouraged compliance with appointment. Patient unable to go ahead with repeat chest x-ray due to cost concerns. Will await neurology workup for further investigation.

## 2023-12-18 NOTE — Progress Notes (Signed)
 Virtual Visit via Video Note  I connected with Christie Anderson on 12/18/23 at  2:00 PM EDT by a video enabled telemedicine application and verified that I am speaking with the correct person using two identifiers.  Patient Location: Home Provider Location: Office/Clinic  I discussed the limitations, risks, security, and privacy concerns of performing an evaluation and management service by video and the availability of in person appointments. I also discussed with the patient that there may be a patient responsible charge related to this service. The patient expressed understanding and agreed to proceed.  Subjective: PCP: Suhey Radford, PA-C  Chief Complaint  Patient presents with   discuss insulin    Patient presents today to follow up regarding blood sugars and newly prescribed insulin . She relates sugar levels have remained in the 400s since starting insulin  on Wednesday. She denies episodes of hypoglycemia. She admits diet changes and does not eat sweets or drink sugary beverages. Patient also states she cannot afford to do a repeat chest x-ray due to cost, as she was billed over $200. She has an upcoming neurology appointment tomorrow to discuss proximal leg muscle weakness. She admits leg weakness has gotten worse over the last week. Lastly, she requests a letter for her disability case stating she is unable to work due to current physical health.      ROS: Per HPI  Current Outpatient Medications:    insulin  glargine (LANTUS ) 100 UNIT/ML injection, Inject 0.15 mLs (15 Units total) into the skin daily., Disp: 10 mL, Rfl: 2   insulin  lispro (HUMALOG ) 100 UNIT/ML injection, Inject 0.05 mLs (5 Units total) into the skin with breakfast, with lunch, and with evening meal., Disp: 10 mL, Rfl: 1   Insulin  Syringe-Needle U-100 (INSULIN  SYRINGE .5CC/30GX5/16) 30G X 5/16 0.5 ML MISC, Use one syringe and needle per insulin  injection., Disp: 100 each, Rfl: 1  Observations/Objective: There  were no vitals filed for this visit. Physical Exam Constitutional:      General: She is not in acute distress.    Appearance: She is not ill-appearing.  Eyes:     Extraocular Movements: Extraocular movements intact.  Pulmonary:     Effort: Pulmonary effort is normal.  Musculoskeletal:     Cervical back: Normal range of motion.  Skin:    General: Skin is warm.     Coloration: Skin is not pale.  Psychiatric:        Mood and Affect: Mood normal.     Assessment and Plan: Type 2 diabetes mellitus with hyperglycemia, with long-term current use of insulin  [facility]) Assessment & Plan: Patient presents today with hyperglycemia. Adjusting insulin  today. Patient to do 15 units of basal insulin  daily and 5 units of short acting insulin  with meals. Continue with healthy dietary changes. Patient to follow up in approximately 3 months.   Orders: -     Insulin  Glargine; Inject 0.15 mLs (15 Units total) into the skin daily.  Dispense: 10 mL; Refill: 2 -     Insulin  Lispro; Inject 0.05 mLs (5 Units total) into the skin with breakfast, with lunch, and with evening meal.  Dispense: 10 mL; Refill: 1  Weakness of both lower extremities Assessment & Plan: Patient reports leg weakness has worsened in the last week. She is scheduled to see neurology tomorrow, highly encouraged compliance with appointment. Patient unable to go ahead with repeat chest x-ray due to cost concerns. Will await neurology workup for further investigation.      Follow Up Instructions: Return in about  3 months (around 03/19/2024).   I discussed the assessment and treatment plan with the patient. The patient was provided an opportunity to ask questions, and all were answered. The patient agreed with the plan and demonstrated an understanding of the instructions.   The patient was advised to call back or seek an in-person evaluation if the symptoms worsen or if the condition fails to improve as anticipated.  The above assessment  and management plan was discussed with the patient. The patient verbalized understanding of and has agreed to the management plan.   Charmaine Derico Mitton, PA-C

## 2023-12-18 NOTE — Assessment & Plan Note (Signed)
 Patient presents today with hyperglycemia. Adjusting insulin  today. Patient to do 15 units of basal insulin  daily and 5 units of short acting insulin  with meals. Continue with healthy dietary changes. Patient to follow up in approximately 3 months.

## 2023-12-19 ENCOUNTER — Encounter: Payer: Self-pay | Admitting: Neurology

## 2023-12-19 ENCOUNTER — Ambulatory Visit: Payer: Self-pay | Admitting: Neurology

## 2023-12-20 ENCOUNTER — Telehealth: Payer: Self-pay | Admitting: *Deleted

## 2023-12-20 NOTE — Telephone Encounter (Signed)
 Called patient to inform of provider recommendations no answer

## 2023-12-20 NOTE — Telephone Encounter (Signed)
 Copied from CRM #[id number]. Topic: Clinical - Medical Advice >> Dec 20, 2023  1:38 PM Harlene ORN wrote: Reason for CRM: was supposed to have a Neurology appointment yesterday [name] to eat at a restaraunt and when she went to stand up, she had no feeling in her legs (said they felt like jelly) and fell to the floor. PCP is aware of her condition. took five people to get her up and by the time she got to the appointment, she was tool ate and they would not take her Rescheduled herself to September.

## 2024-01-25 ENCOUNTER — Telehealth: Payer: Self-pay

## 2024-02-09 ENCOUNTER — Emergency Department (HOSPITAL_COMMUNITY): Payer: MEDICAID

## 2024-02-09 ENCOUNTER — Inpatient Hospital Stay (HOSPITAL_COMMUNITY)
Admission: EM | Admit: 2024-02-09 | Discharge: 2024-02-16 | DRG: 286 | Disposition: A | Discharge status: Home / Self Care | Payer: MEDICAID | Attending: Internal Medicine | Admitting: Internal Medicine

## 2024-02-09 ENCOUNTER — Other Ambulatory Visit: Payer: Self-pay

## 2024-02-09 ENCOUNTER — Encounter (HOSPITAL_COMMUNITY): Payer: Self-pay

## 2024-02-09 DIAGNOSIS — E8809 Other disorders of plasma-protein metabolism, not elsewhere classified: Secondary | ICD-10-CM | POA: Diagnosis not present

## 2024-02-09 DIAGNOSIS — R002 Palpitations: Secondary | ICD-10-CM | POA: Diagnosis present

## 2024-02-09 DIAGNOSIS — I5021 Acute systolic (congestive) heart failure: Secondary | ICD-10-CM | POA: Diagnosis present

## 2024-02-09 DIAGNOSIS — Z881 Allergy status to other antibiotic agents status: Secondary | ICD-10-CM

## 2024-02-09 DIAGNOSIS — Z91014 Allergy to mammalian meats: Secondary | ICD-10-CM

## 2024-02-09 DIAGNOSIS — E871 Hypo-osmolality and hyponatremia: Secondary | ICD-10-CM | POA: Diagnosis not present

## 2024-02-09 DIAGNOSIS — E861 Hypovolemia: Secondary | ICD-10-CM | POA: Diagnosis not present

## 2024-02-09 DIAGNOSIS — Z6823 Body mass index (BMI) 23.0-23.9, adult: Secondary | ICD-10-CM

## 2024-02-09 DIAGNOSIS — I428 Other cardiomyopathies: Secondary | ICD-10-CM | POA: Diagnosis present

## 2024-02-09 DIAGNOSIS — Z8249 Family history of ischemic heart disease and other diseases of the circulatory system: Secondary | ICD-10-CM

## 2024-02-09 DIAGNOSIS — D72829 Elevated white blood cell count, unspecified: Secondary | ICD-10-CM | POA: Diagnosis present

## 2024-02-09 DIAGNOSIS — Z794 Long term (current) use of insulin: Secondary | ICD-10-CM

## 2024-02-09 DIAGNOSIS — Z560 Unemployment, unspecified: Secondary | ICD-10-CM

## 2024-02-09 DIAGNOSIS — I110 Hypertensive heart disease with heart failure: Principal | ICD-10-CM | POA: Diagnosis present

## 2024-02-09 DIAGNOSIS — E43 Unspecified severe protein-calorie malnutrition: Secondary | ICD-10-CM | POA: Diagnosis present

## 2024-02-09 DIAGNOSIS — I5023 Acute on chronic systolic (congestive) heart failure: Secondary | ICD-10-CM | POA: Insufficient documentation

## 2024-02-09 DIAGNOSIS — I509 Heart failure, unspecified: Principal | ICD-10-CM

## 2024-02-09 DIAGNOSIS — I10 Essential (primary) hypertension: Secondary | ICD-10-CM

## 2024-02-09 DIAGNOSIS — E1165 Type 2 diabetes mellitus with hyperglycemia: Secondary | ICD-10-CM | POA: Diagnosis present

## 2024-02-09 DIAGNOSIS — F17210 Nicotine dependence, cigarettes, uncomplicated: Secondary | ICD-10-CM | POA: Diagnosis present

## 2024-02-09 DIAGNOSIS — Z716 Tobacco abuse counseling: Secondary | ICD-10-CM

## 2024-02-09 DIAGNOSIS — I5020 Unspecified systolic (congestive) heart failure: Secondary | ICD-10-CM

## 2024-02-09 DIAGNOSIS — E7800 Pure hypercholesterolemia, unspecified: Secondary | ICD-10-CM | POA: Diagnosis present

## 2024-02-09 DIAGNOSIS — E876 Hypokalemia: Secondary | ICD-10-CM | POA: Diagnosis present

## 2024-02-09 DIAGNOSIS — Z79899 Other long term (current) drug therapy: Secondary | ICD-10-CM

## 2024-02-09 DIAGNOSIS — Z880 Allergy status to penicillin: Secondary | ICD-10-CM

## 2024-02-09 DIAGNOSIS — E11649 Type 2 diabetes mellitus with hypoglycemia without coma: Secondary | ICD-10-CM | POA: Diagnosis not present

## 2024-02-09 HISTORY — DX: Type 2 diabetes mellitus without complications: E11.9

## 2024-02-09 LAB — COMPREHENSIVE METABOLIC PANEL WITH GFR
ALT: 43 U/L (ref 0–44)
AST: 38 U/L (ref 15–41)
Albumin: 2.4 g/dL — ABNORMAL LOW (ref 3.5–5.0)
Alkaline Phosphatase: 176 U/L — ABNORMAL HIGH (ref 38–126)
Anion gap: 11 (ref 5–15)
BUN: 25 mg/dL — ABNORMAL HIGH (ref 6–20)
CO2: 25 mmol/L (ref 22–32)
Calcium: 8.3 mg/dL — ABNORMAL LOW (ref 8.9–10.3)
Chloride: 94 mmol/L — ABNORMAL LOW (ref 98–111)
Creatinine, Ser: 0.43 mg/dL — ABNORMAL LOW (ref 0.44–1.00)
GFR, Estimated: >60 mL/min (ref >60)
Glucose, Bld: 440 mg/dL — ABNORMAL HIGH (ref 70–99)
Potassium: 4.4 mmol/L (ref 3.5–5.1)
Sodium: 130 mmol/L — ABNORMAL LOW (ref 135–145)
Total Bilirubin: 0.4 mg/dL (ref 0.0–1.2)
Total Protein: 6.2 g/dL — ABNORMAL LOW (ref 6.5–8.1)

## 2024-02-09 LAB — CBC
HCT: 46.6 % — ABNORMAL HIGH (ref 36.0–46.0)
Hemoglobin: 16.2 g/dL — ABNORMAL HIGH (ref 12.0–15.0)
MCH: 31.4 pg (ref 26.0–34.0)
MCHC: 34.8 g/dL (ref 30.0–36.0)
MCV: 90.3 fL (ref 80.0–100.0)
Platelets: 314 K/uL (ref 150–400)
RBC: 5.16 MIL/uL — ABNORMAL HIGH (ref 3.87–5.11)
RDW: 12.5 % (ref 11.5–15.5)
WBC: 13.4 K/uL — ABNORMAL HIGH (ref 4.0–10.5)
nRBC: 0 % (ref 0.0–0.2)

## 2024-02-09 LAB — MAGNESIUM: Magnesium: 1.9 mg/dL (ref 1.7–2.4)

## 2024-02-09 LAB — BRAIN NATRIURETIC PEPTIDE: B Natriuretic Peptide: 1135 pg/mL — ABNORMAL HIGH (ref 0.0–100.0)

## 2024-02-09 LAB — GLUCOSE, RANDOM: Glucose, Bld: 536 mg/dL (ref 70–99)

## 2024-02-09 LAB — TROPONIN I (HIGH SENSITIVITY)
Troponin I (High Sensitivity): 18 ng/L — ABNORMAL HIGH (ref <18)
Troponin I (High Sensitivity): 18 ng/L — ABNORMAL HIGH (ref <18)

## 2024-02-09 LAB — GLUCOSE, CAPILLARY: Glucose-Capillary: 518 mg/dL (ref 70–99)

## 2024-02-09 MED ORDER — FUROSEMIDE 10 MG/ML IJ SOLN
20.0000 mg | Freq: Once | INTRAMUSCULAR | Status: AC
  Administered 2024-02-09: 20 mg via INTRAVENOUS
  Filled 2024-02-09: qty 2

## 2024-02-09 MED ORDER — POLYETHYLENE GLYCOL 3350 17 G PO PACK: 17.0000 g | PACK | Freq: Every day | ORAL | Status: DC | PRN

## 2024-02-09 MED ORDER — ONDANSETRON HCL 4 MG/2ML IJ SOLN: 4.0000 mg | Freq: Four times a day (QID) | INTRAMUSCULAR | Status: DC | PRN

## 2024-02-09 MED ORDER — INSULIN ASPART 100 UNIT/ML IJ SOLN
15.0000 [IU] | Freq: Once | INTRAMUSCULAR | Status: AC
  Administered 2024-02-10: 15 [IU] via SUBCUTANEOUS

## 2024-02-09 MED ORDER — INSULIN ASPART 100 UNIT/ML IJ SOLN
0.0000 [IU] | Freq: Every day | INTRAMUSCULAR | Status: DC
  Administered 2024-02-13 – 2024-02-14 (×2): 2 [IU] via SUBCUTANEOUS
  Administered 2024-02-15: 5 [IU] via SUBCUTANEOUS

## 2024-02-09 MED ORDER — INSULIN GLARGINE 100 UNIT/ML ~~LOC~~ SOLN
30.0000 [IU] | Freq: Every day | SUBCUTANEOUS | Status: DC
  Administered 2024-02-09 – 2024-02-10 (×2): 30 [IU] via SUBCUTANEOUS
  Filled 2024-02-09 (×3): qty 0.3

## 2024-02-09 MED ORDER — INSULIN ASPART 100 UNIT/ML IJ SOLN
0.0000 [IU] | Freq: Three times a day (TID) | INTRAMUSCULAR | Status: DC
  Administered 2024-02-10: 4 [IU] via SUBCUTANEOUS
  Administered 2024-02-12: 3 [IU] via SUBCUTANEOUS
  Administered 2024-02-12: 4 [IU] via SUBCUTANEOUS
  Administered 2024-02-13: 3 [IU] via SUBCUTANEOUS
  Administered 2024-02-14: 11 [IU] via SUBCUTANEOUS
  Administered 2024-02-14: 4 [IU] via SUBCUTANEOUS
  Administered 2024-02-14: 7 [IU] via SUBCUTANEOUS
  Administered 2024-02-15: 3 [IU] via SUBCUTANEOUS

## 2024-02-09 MED ORDER — POTASSIUM CHLORIDE 20 MEQ PO PACK
40.0000 meq | PACK | Freq: Two times a day (BID) | ORAL | Status: DC
  Administered 2024-02-09 – 2024-02-14 (×11): 40 meq via ORAL
  Filled 2024-02-09 (×11): qty 2

## 2024-02-09 MED ORDER — ENOXAPARIN SODIUM 40 MG/0.4ML IJ SOSY
40.0000 mg | PREFILLED_SYRINGE | Freq: Every day | INTRAMUSCULAR | Status: DC
Start: 2024-02-09 — End: 2024-02-16
  Administered 2024-02-09 – 2024-02-15 (×7): 40 mg via SUBCUTANEOUS
  Filled 2024-02-09 (×7): qty 0.4

## 2024-02-09 MED ORDER — ONDANSETRON HCL 4 MG PO TABS: 4.0000 mg | ORAL_TABLET | Freq: Four times a day (QID) | ORAL | Status: DC | PRN

## 2024-02-09 MED ORDER — FUROSEMIDE 10 MG/ML IJ SOLN
40.0000 mg | Freq: Two times a day (BID) | INTRAMUSCULAR | Status: DC
  Administered 2024-02-10 – 2024-02-12 (×6): 40 mg via INTRAVENOUS
  Filled 2024-02-09 (×7): qty 4

## 2024-02-09 NOTE — ED Notes (Signed)
 Patient transported to CT

## 2024-02-09 NOTE — ED Notes (Signed)
 Pt ambulated from Bathroom in Room 8 to Nurse's station, tolerated well. Initial O2 @ 93%, checked again after walking to nurse's station and was @ 92%, pt endorsed some SOB and weakness, but still tolerated ambulation well. O2 Checked again once back in the room and read 94% on RA. Pt tolerated well.

## 2024-02-09 NOTE — H&P (Signed)
 History and Physical    Patient: Christie Anderson FMW:[id number] DOB: 09-19-1975 DOA: 02/09/2024 DOS: the patient was seen and examined on 02/09/2024 PCP: Grooms, Charmaine, PA-C  Patient coming from: Home  Chief Complaint:  Chief Complaint  Patient presents with   Multiple Complaints   HPI: Christie Anderson is a 48 y.o. female with medical history significant of recent diagnosis of diabetes on insulin .  Patient has been having difficulty breathing for the last month.  She is unable to ambulate very far without getting really short of breath.  In addition, she has been having orthopnea over the last several days and has to sit up in order to sleep.  Her symptoms have have been worsening.  She does have significant edema in her lower extremities up to her abdomen.  Denies fevers, chills, nausea, vomiting.  Denies chest pain.  Review of Systems: As mentioned in the history of present illness. All other systems reviewed and are negative. History reviewed. No pertinent past medical history. History reviewed. No pertinent surgical history. Social History:  reports that she has been smoking. She has never used smokeless tobacco. No history on file for alcohol use and drug use.  Allergies  Allergen Reactions   Amoxicillin    Doxycycline     Vomiting    Penicillins     History reviewed. No pertinent family history.  Prior to Admission medications   Medication Sig Start Date End Date Taking? Authorizing Provider  insulin  glargine (LANTUS ) 100 UNIT/ML injection Inject 0.15 mLs (15 Units total) into the skin daily. 12/18/23   Grooms, Charmaine, PA-C  insulin  lispro (HUMALOG ) 100 UNIT/ML injection Inject 0.05 mLs (5 Units total) into the skin with breakfast, with lunch, and with evening meal. 12/18/23   Grooms, Bryant, PA-C  Insulin  Syringe-Needle U-100 (INSULIN  SYRINGE .5CC/30GX5/16) 30G X 5/16 0.5 ML MISC Use one syringe and needle per insulin  injection. 12/13/23   Grooms, [place], [place]     Physical Exam: Vitals:   02/09/24 1656 02/09/24 1845 02/09/24 1915 02/09/24 1945  BP:  (!) 156/95 (!) 144/92   Pulse: 99 96 96   Resp: 15 13 17    Temp:      TempSrc:      SpO2: 95% 92%  92%  Weight: 79.3 kg     Height:       General: Middle-age female. Awake and alert and oriented x3. No acute cardiopulmonary distress.  HEENT: Normocephalic atraumatic.  Right and left ears normal in appearance.  Pupils equal, round, reactive to light. Extraocular muscles are intact. Sclerae anicteric and noninjected.  Moist mucosal membranes. No mucosal lesions.  Neck: Neck supple without lymphadenopathy. No carotid bruits. No masses palpated.  Cardiovascular: Regular rate with normal S1-S2 sounds. No murmurs, rubs, gallops auscultated.  Increased JVD.  2+ pitting edema in lower extremities up to lower abdomen Respiratory: Rales in lower lung fields bilaterally lungs clear to auscultation bilaterally.  No accessory muscle use. Abdomen: Soft, nontender, nondistended. Active bowel sounds. No masses or hepatosplenomegaly  Skin: No rashes, lesions, or ulcerations.  Dry, warm to touch. 2+ dorsalis pedis and radial pulses. Musculoskeletal: No calf or leg pain. All major joints not erythematous nontender.  No upper or lower joint deformation.  Good ROM.  No contractures  Psychiatric: Intact judgment and insight. Pleasant and cooperative. Neurologic: No focal neurological deficits. Strength is 5/5 and symmetric in upper and lower extremities.  Cranial nerves II through XII are grossly intact.  Data Reviewed: Labs and imaging reviewed by me  Assessment and Plan: No notes have been filed under this hospital service. Service: Hospitalist  Principal Problem:   Acute CHF (congestive heart failure) (HCC) Active Problems:   Type 2 diabetes mellitus with hyperglycemia, with long-term current use of insulin  (HCC)  Acute CHF Telemetry monitoring Strict I/O Daily Weights Diuresis: Lasix  40 mg twice  daily Potassium: 40 mEq twice a day by mouth Echo cardiac exam tomorrow Repeat BMP tomorrow Diabetes type 2 Blood sugars are still not controlled Increase Lantus  to 40 units at night with sliding scale and CBGs AC and nightly Will likely need further titration in order to be sent home on a reasonable regimen Hypertension Mildly elevated.  Would likely need to start ACE inhibitor   Advance Care Planning:   Code Status: Full Code   Consults: None  Family Communication: Family friend present during interview  Severity of Illness: The appropriate patient status for this patient is INPATIENT. Inpatient status is judged to be reasonable and necessary in order to provide the required intensity of service to ensure the patient's safety. The patient's presenting symptoms, physical exam findings, and initial radiographic and laboratory data in the context of their chronic comorbidities is felt to place them at high risk for further clinical deterioration. Furthermore, it is not anticipated that the patient will be medically stable for discharge from the hospital within 2 midnights of admission.   * I certify that at the point of admission it is my clinical judgment that the patient will require inpatient hospital care spanning beyond 2 midnights from the point of admission due to high intensity of service, high risk for further deterioration and high frequency of surveillance required.*  Author: Emmagrace Runkel J Reya Aurich, DO 02/09/2024 8:11 PM  For on call review [email or web address].

## 2024-02-09 NOTE — ED Provider Notes (Signed)
 [place] EMERGENCY DEPARTMENT AT [facility] Provider Note   CSN: [id number] Arrival date & time: 02/09/24  1520     Patient presents with: Multiple Complaints   Christie Anderson is a 48 y.o. female.   Patient to ED with progressively worsening LE edema and weight gain since March of this year. Since then, she reports progressive DOE and orthopnea, reporting that last night she had to sleep sitting straight up. The LE swelling improves slightly with elevation. She denies chest pain, alcohol abuse, liver problems, nausea, vomiting or fever. She is a continuous smoker. She denies past medical history, until June when she established with a primary care provider and was found to have T2DM and high cholesterol. She is now on insulin  only.   The history is provided by the patient. No language interpreter was used.       Prior to Admission medications   Medication Sig Start Date End Date Taking? Authorizing Provider  insulin  glargine (LANTUS ) 100 UNIT/ML injection Inject 0.15 mLs (15 Units total) into the skin daily. 12/18/23   Grooms, Courtney, PA-C  insulin  lispro (HUMALOG ) 100 UNIT/ML injection Inject 0.05 mLs (5 Units total) into the skin with breakfast, with lunch, and with evening meal. 12/18/23   Grooms, Ridott, PA-C  Insulin  Syringe-Needle U-100 (INSULIN  SYRINGE .5CC/30GX5/16) 30G X 5/16 0.5 ML MISC Use one syringe and needle per insulin  injection. 12/13/23   Grooms, Morrison, PA-C    Allergies: Amoxicillin, Doxycycline, and Penicillins    Review of Systems  Updated Vital Signs BP (!) 156/95   Pulse 96   Temp 97.8 F (36.6 C) (Oral)   Resp 13   Ht 5' 9 (1.753 m)   Wt 79.3 kg   SpO2 92%   BMI 25.81 kg/m   Physical Exam Vitals and nursing note reviewed.  Constitutional:      Appearance: Normal appearance.  HENT:     Head: Normocephalic.  Eyes:     General: No scleral icterus.    Conjunctiva/sclera: Conjunctivae normal.  Cardiovascular:     Rate and  Rhythm: Normal rate and regular rhythm.     Heart sounds: No murmur heard. Pulmonary:     Effort: Pulmonary effort is normal.     Breath sounds: Rales (Scattered) present.  Abdominal:     Palpations: Abdomen is soft.     Comments: Edema extending from LE's across lower abdomen.  Musculoskeletal:     Cervical back: Normal range of motion and neck supple.     Right lower leg: Edema present.     Left lower leg: Edema present.     Comments: 2+ pitting to LE's L>R.  Skin:    General: Skin is warm and dry.  Neurological:     Mental Status: She is alert and oriented to person, place, and time.     (all labs ordered are listed, but only abnormal results are displayed) Labs Reviewed  BRAIN NATRIURETIC PEPTIDE - Abnormal; Notable for the following components:      Result Value   B Natriuretic Peptide 1,135.0 (*)    All other components within normal limits  CBC - Abnormal; Notable for the following components:   WBC 13.4 (*)    RBC 5.16 (*)    Hemoglobin 16.2 (*)    HCT 46.6 (*)    All other components within normal limits  COMPREHENSIVE METABOLIC PANEL WITH GFR - Abnormal; Notable for the following components:   Sodium 130 (*)    Chloride 94 (*)  Glucose, Bld 440 (*)    BUN 25 (*)    Creatinine, Ser 0.43 (*)    Calcium 8.3 (*)    Total Protein 6.2 (*)    Albumin 2.4 (*)    Alkaline Phosphatase 176 (*)    All other components within normal limits  MAGNESIUM   TROPONIN I (HIGH SENSITIVITY)  TROPONIN I (HIGH SENSITIVITY)    EKG: EKG Interpretation Date/Time:  Friday February 09 2024 15:57:30 EDT Ventricular Rate:  98 PR Interval:  154 QRS Duration:  64 QT Interval:  410 QTC Calculation: 523 R Axis:   65  Text Interpretation: Normal sinus rhythm Low voltage QRS Septal infarct , age undetermined Abnormal ECG No previous ECGs available Confirmed by Towana Sharper [phone]) on 02/09/2024 4:00:06 PM  Radiology: [facility] Chest Portable 1 View Result Date: 02/09/2024 CLINICAL  DATA:  Difficulty breathing as well as difficulty walking. Patient states fluid buildup. EXAM: PORTABLE CHEST 1 VIEW COMPARISON:  11/29/2023 FINDINGS: Lungs are somewhat hypoinflated with interval worsening of a moderate size right pleural effusion and small left pleural effusion. There is likely associated bibasilar atelectasis. Infection in the lung bases is possible. Remainder of the exam is unchanged. IMPRESSION: Interval worsening of moderate size right pleural effusion and small left pleural effusion with likely associated bibasilar atelectasis. Infection in the lung bases is possible. Electronically Signed   By: Toribio Agreste M.D.   On: 02/09/2024 16:54     Procedures   Medications Ordered in the ED  furosemide  (LASIX ) injection 20 mg (has no administration in time range)  furosemide  (LASIX ) injection 20 mg (20 mg Intravenous Given 02/09/24 1816)    Clinical Course as of 02/09/24 1939  Fri Feb 09, 2024  1642 Patient to ED with progressive LE and abdominal swelling for the past 6 months, now with SOB/DOE and orthopnea.  [SU]  1839 BNP >1300. CXR with pleural effusions. Patient with new-onset CHF, 40-pound weight gain over 3 months, significant peripheral edema. No hypoxia at rest or with ambulation but becomes dyspneic. Feels she will need work up for new onset CHF. Lasix  started at 20 mg IV.  [SU]  1938 Discussed with Dr. Barbra who accepts for admission.  [SU]    Clinical Course User Index [SU] Odell Balls, PA-C                                 Medical Decision Making Amount and/or Complexity of Data Reviewed Labs: ordered. Radiology: ordered.  Risk Prescription drug management. Decision regarding hospitalization.        Final diagnoses:  New onset of congestive heart failure [facility])    ED Discharge Orders     None          Odell Balls RIGGERS 02/09/24 1939    Towana Sharper BROCKS, MD 02/10/24 [phone]

## 2024-02-09 NOTE — ED Triage Notes (Addendum)
 Pt stated that she has 40 lbs of fluid that has built up on her. Stated that she can't walk or get up and is having a hard time breathing. These have been ongoing issues since March. Pt stated that she is not prescribed any fluid pills.

## 2024-02-10 ENCOUNTER — Inpatient Hospital Stay (HOSPITAL_COMMUNITY): Payer: Self-pay

## 2024-02-10 DIAGNOSIS — I509 Heart failure, unspecified: Secondary | ICD-10-CM

## 2024-02-10 LAB — ECHOCARDIOGRAM COMPLETE
Area-P 1/2: 3.91 cm2
Calc EF: 35.4 %
Height: 69 in
S' Lateral: 3.5 cm
Single Plane A2C EF: 29.4 %
Single Plane A4C EF: 41.3 %
Weight: 2871.27 [oz_av]

## 2024-02-10 LAB — CBC
HCT: 43.1 % (ref 36.0–46.0)
Hemoglobin: 14.7 g/dL (ref 12.0–15.0)
MCH: 30.9 pg (ref 26.0–34.0)
MCHC: 34.1 g/dL (ref 30.0–36.0)
MCV: 90.5 fL (ref 80.0–100.0)
Platelets: 257 K/uL (ref 150–400)
RBC: 4.76 MIL/uL (ref 3.87–5.11)
RDW: 12.6 % (ref 11.5–15.5)
WBC: 10 K/uL (ref 4.0–10.5)
nRBC: 0 % (ref 0.0–0.2)

## 2024-02-10 LAB — BASIC METABOLIC PANEL WITH GFR
Anion gap: 8 (ref 5–15)
BUN: 21 mg/dL — ABNORMAL HIGH (ref 6–20)
CO2: 27 mmol/L (ref 22–32)
Calcium: 8 mg/dL — ABNORMAL LOW (ref 8.9–10.3)
Chloride: 99 mmol/L (ref 98–111)
Creatinine, Ser: 0.56 mg/dL (ref 0.44–1.00)
GFR, Estimated: >60 mL/min (ref >60)
Glucose, Bld: 160 mg/dL — ABNORMAL HIGH (ref 70–99)
Potassium: 3.4 mmol/L — ABNORMAL LOW (ref 3.5–5.1)
Sodium: 134 mmol/L — ABNORMAL LOW (ref 135–145)

## 2024-02-10 LAB — GLUCOSE, CAPILLARY
Glucose-Capillary: 389 mg/dL — ABNORMAL HIGH (ref 70–99)
Glucose-Capillary: 122 mg/dL — ABNORMAL HIGH (ref 70–99)
Glucose-Capillary: 116 mg/dL — ABNORMAL HIGH (ref 70–99)
Glucose-Capillary: 154 mg/dL — ABNORMAL HIGH (ref 70–99)
Glucose-Capillary: 137 mg/dL — ABNORMAL HIGH (ref 70–99)

## 2024-02-10 LAB — HIV ANTIBODY (ROUTINE TESTING W REFLEX): HIV Screen 4th Generation wRfx: NONREACTIVE

## 2024-02-10 MED ORDER — LOSARTAN POTASSIUM 50 MG PO TABS
50.0000 mg | ORAL_TABLET | Freq: Every day | ORAL | Status: DC
Start: 2024-02-10 — End: 2024-02-12
  Administered 2024-02-10 – 2024-02-12 (×3): 50 mg via ORAL
  Filled 2024-02-10 (×3): qty 1

## 2024-02-10 MED ORDER — PERFLUTREN LIPID MICROSPHERE
1.0000 mL | INTRAVENOUS | Status: AC | PRN
  Administered 2024-02-10: 2 mL via INTRAVENOUS

## 2024-02-10 NOTE — Progress Notes (Signed)
 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  Patient is currently hemodynamically stable, on room air with stable vitals except for slightly elevated blood pressure.  I spoke to Dr. Camnitz/cardiology on-call who recommended to continue diuresis for now and transition to oral diuretics over the weekend if stable or have inpatient cardiology evaluated her on Monday at [facility]  No need for transfer to [facility] at this time.  He also recommended to start ARB and/or beta-blocker at some point.  Will start losartan  50 mg daily as blood pressure still on the higher side.  Will hold off on starting beta-blocker at this time.

## 2024-02-10 NOTE — Progress Notes (Signed)
 Pt and family member educated on fluid restricttions and rationale for implementation. Both stated understanding.

## 2024-02-10 NOTE — Progress Notes (Signed)
 Pt given scheduled IV lasix  overnight , large  volumes of urinary output seen , not measured due to being mixed with stool multiple times, occurrences documented. Was able to document 800 of output without stool once this shift, see related flowsheets.

## 2024-02-10 NOTE — Progress Notes (Signed)
 PROGRESS NOTE    Christie Anderson  FMW:[id number] DOB: 1975-11-05 DOA: 02/09/2024 PCP: Mancil Pfeiffer, PA-C   Brief Narrative:  48 year old female with history of recent diagnosis of diabetes mellitus type 2 on insulin  presented with worsening shortness of breath, orthopnea and lower extremity swelling.  On presentation, chest x-ray showed interval worsening of moderate size right pleural effusion and small left pleural effusion with likely associated bibasilar atelectasis.  She was started on IV Lasix  for possible acute CHF.  Assessment & Plan:   Acute unspecified CHF Bilateral pleural effusion -Presented with worsening shortness of breath, orthopnea and lower extremity swelling.  On presentation, chest x-ray showed interval worsening of moderate size right pleural effusion and small left pleural effusion with likely associated bibasilar atelectasis. - Follow 2D echo.  Continue strict input and output.  Daily weights.  Fluid restriction.  Continue IV Lasix  every 12 hours for now.  Monitor BMP.  Replace potassium  Hyponatremia -Improving.  Monitor  Hypokalemia - Replace.  Repeat a.m. labs  Leukocytosis -Resolved  Diabetes mellitus type 2 with hyperglycemia -Carb modified diet.  Continue long-acting insulin  along with CBGs with SSI  Hypertension - Blood pressure intermittently elevated.  Continue Lasix .  Might have to start other antihypertensives if continues to remain elevated   DVT prophylaxis: Lovenox  Code Status: Full Family Communication: None at bedside Disposition Plan: Status is: Inpatient Remains inpatient appropriate because: Of severity of illness  Consultants: None  Procedures: None  Antimicrobials: None   Subjective: Patient seen and examined at bedside.  Feels slightly better but still short of breath with exertion.  No fever, chest pain or vomiting reported.  Objective: Vitals:   02/09/24 1945 02/09/24 2029 02/10/24 0045 02/10/24 0513  BP:  (!)  142/93 (!) 157/91 (!) 152/99  Pulse:  97 85 88  Resp:  18 19 17   Temp:  97.8 F (36.6 C) 98.1 F (36.7 C) 98.1 F (36.7 C)  TempSrc:  Oral Oral Oral  SpO2: 92% 93% 95% 97%  Weight:    81.4 kg  Height:        Intake/Output Summary (Last 24 hours) at 02/10/2024 0902 Last data filed at 02/10/2024 0145 Gross per 24 hour  Intake --  Output 800 ml  Net -800 ml   Filed Weights   02/09/24 1551 02/09/24 1656 02/10/24 0513  Weight: 61.2 kg 79.3 kg 81.4 kg    Examination:  General exam: Appears calm and comfortable. Respiratory system: Bilateral decreased breath sounds at bases with scattered crackles Cardiovascular system: S1 & S2 heard, Rate controlled Gastrointestinal system: Abdomen is nondistended, soft and nontender. Normal bowel sounds heard. Extremities: No cyanosis, clubbing; bilateral lower extremity edema  Central nervous system: Alert and oriented. No focal neurological deficits. Moving extremities Skin: No rashes, lesions or ulcers Psychiatry: Judgement and insight appear normal. Mood & affect appropriate.     Data Reviewed: I have personally reviewed following labs and imaging studies  CBC: Recent Labs  Lab 02/09/24 1625 02/10/24 0533  WBC 13.4* 10.0  HGB 16.2* 14.7  HCT 46.6* 43.1  MCV 90.3 90.5  PLT 314 257   Basic Metabolic Panel: Recent Labs  Lab 02/09/24 1625 02/09/24 2315 02/10/24 0533  NA 130*  --  134*  K 4.4  --  3.4*  CL 94*  --  99  CO2 25  --  27  GLUCOSE 440* 536* 160*  BUN 25*  --  21*  CREATININE 0.43*  --  0.56  CALCIUM 8.3*  --  8.0*  MG 1.9  --   --    GFR: Estimated Creatinine Clearance: 96 mL/min (by C-G formula based on SCr of 0.56 mg/dL). Liver Function Tests: Recent Labs  Lab 02/09/24 1625  AST 38  ALT 43  ALKPHOS 176*  BILITOT 0.4  PROT 6.2*  ALBUMIN 2.4*   No results for input(s): LIPASE, AMYLASE in the last 168 hours. No results for input(s): AMMONIA in the last 168 hours. Coagulation Profile: No  results for input(s): INR, PROTIME in the last 168 hours. Cardiac Enzymes: No results for input(s): CKTOTAL, CKMB, CKMBINDEX, TROPONINI in the last 168 hours. BNP (last 3 results) No results for input(s): PROBNP in the last 8760 hours. HbA1C: No results for input(s): HGBA1C in the last 72 hours. CBG: Recent Labs  Lab 02/09/24 2231 02/10/24 0148 02/10/24 0756  GLUCAP 518* 389* 122*   Lipid Profile: No results for input(s): CHOL, HDL, LDLCALC, TRIG, CHOLHDL, LDLDIRECT in the last 72 hours. Thyroid Function Tests: No results for input(s): TSH, T4TOTAL, FREET4, T3FREE, THYROIDAB in the last 72 hours. Anemia Panel: No results for input(s): VITAMINB12, FOLATE, FERRITIN, TIBC, IRON, RETICCTPCT in the last 72 hours. Sepsis Labs: No results for input(s): PROCALCITON, LATICACIDVEN in the last 168 hours.  No results found for this or any previous visit (from the past 240 hours).       Radiology Studies: DG Chest Portable 1 View Result Date: 02/09/2024 CLINICAL DATA:  Difficulty breathing as well as difficulty walking. Patient states fluid buildup. EXAM: PORTABLE CHEST 1 VIEW COMPARISON:  11/29/2023 FINDINGS: Lungs are somewhat hypoinflated with interval worsening of a moderate size right pleural effusion and small left pleural effusion. There is likely associated bibasilar atelectasis. Infection in the lung bases is possible. Remainder of the exam is unchanged. IMPRESSION: Interval worsening of moderate size right pleural effusion and small left pleural effusion with likely associated bibasilar atelectasis. Infection in the lung bases is possible. Electronically Signed   By: Toribio Agreste M.D.   On: 02/09/2024 16:54        Scheduled Meds:  enoxaparin  (LOVENOX ) injection  40 mg Subcutaneous QHS   furosemide   40 mg Intravenous BID   insulin  aspart  0-20 Units Subcutaneous TID WC   insulin  aspart  0-5 Units Subcutaneous QHS   insulin   glargine  30 Units Subcutaneous QHS   potassium chloride   40 mEq Oral BID   Continuous Infusions:        Sophie Mao, MD [facility] 02/10/2024, 9:02 AM

## 2024-02-10 NOTE — Plan of Care (Signed)

## 2024-02-11 LAB — GLUCOSE, CAPILLARY
Glucose-Capillary: 43 mg/dL — CL (ref 70–99)
Glucose-Capillary: 111 mg/dL — ABNORMAL HIGH (ref 70–99)
Glucose-Capillary: 122 mg/dL — ABNORMAL HIGH (ref 70–99)
Glucose-Capillary: 93 mg/dL (ref 70–99)
Glucose-Capillary: 73 mg/dL (ref 70–99)
Glucose-Capillary: 78 mg/dL (ref 70–99)
Glucose-Capillary: 99 mg/dL (ref 70–99)
Glucose-Capillary: 194 mg/dL — ABNORMAL HIGH (ref 70–99)

## 2024-02-11 LAB — BASIC METABOLIC PANEL WITH GFR
Anion gap: 9 (ref 5–15)
BUN: 19 mg/dL (ref 6–20)
CO2: 29 mmol/L (ref 22–32)
Calcium: 8 mg/dL — ABNORMAL LOW (ref 8.9–10.3)
Chloride: 100 mmol/L (ref 98–111)
Creatinine, Ser: 0.36 mg/dL — ABNORMAL LOW (ref 0.44–1.00)
GFR, Estimated: >60 mL/min (ref >60)
Glucose, Bld: 71 mg/dL (ref 70–99)
Potassium: 3.7 mmol/L (ref 3.5–5.1)
Sodium: 138 mmol/L (ref 135–145)

## 2024-02-11 LAB — MAGNESIUM: Magnesium: 1.9 mg/dL (ref 1.7–2.4)

## 2024-02-11 LAB — C DIFFICILE QUICK SCREEN W PCR REFLEX
C Diff antigen: NEGATIVE
C Diff interpretation: NOT DETECTED
C Diff toxin: NEGATIVE

## 2024-02-11 MED ORDER — GLUCOSE 40 % PO GEL: 2.0000 | ORAL | Status: AC

## 2024-02-11 MED ORDER — INSULIN GLARGINE 100 UNIT/ML ~~LOC~~ SOLN
20.0000 [IU] | Freq: Every day | SUBCUTANEOUS | Status: DC
  Administered 2024-02-11 – 2024-02-12 (×2): 20 [IU] via SUBCUTANEOUS
  Filled 2024-02-11 (×3): qty 0.2

## 2024-02-11 NOTE — Progress Notes (Signed)
 PROGRESS NOTE    Christie Anderson  FMW:[id number] DOB: Apr 23, 1976 DOA: 02/09/2024 PCP: Mancil Pfeiffer, PA-C   Brief Narrative:  48 year old female with history of recent diagnosis of diabetes mellitus type 2 on insulin  presented with worsening shortness of breath, orthopnea and lower extremity swelling.  On presentation, chest x-ray showed interval worsening of moderate size right pleural effusion and small left pleural effusion with likely associated bibasilar atelectasis.  She was started on IV Lasix  for possible acute CHF.  Assessment & Plan:   Acute unspecified CHF Bilateral pleural effusion -Presented with worsening shortness of breath, orthopnea and lower extremity swelling.  On presentation, chest x-ray showed interval worsening of moderate size right pleural effusion and small left pleural effusion with likely associated bibasilar atelectasis. 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 Patient is currently hemodynamically stable, on room air with stable vitals except for slightly elevated blood pressure intermittently. I spoke to Dr. Camnitz/cardiology on-call on 02/10/2024 who recommended to continue diuresis for now and transition to oral diuretics over the weekend if stable or have inpatient cardiology evaluate her on Monday at [facility]. No need for transfer to [facility] at this time. He also recommended to start ARB and/or beta-blocker at some point.  Losartan  50 mg daily started on 02/10/2024.  -Continue strict input and output.  Daily weights.  Fluid restriction.  Continue IV Lasix  every 12 hours for now.  Diuresing well: Negative fluid balance of 7040 cc since admission.  Monitor BMP.    Hyponatremia - Labs pending today.  Monitor  Hypokalemia - Labs pending today.  Repeat a.m. labs  Leukocytosis -Resolved  Diabetes mellitus type 2 with hyperglycemia  and hypoglycemia -Carb modified diet.  Continue CBGs with SSI.  Decrease dose of nightly long-acting insulin   Hypertension - Blood pressure intermittently elevated.  Continue Lasix  and losartan .  Will hold off on starting beta-blockers now.  DVT prophylaxis: Lovenox  Code Status: Full Family Communication: None at bedside Disposition Plan: Status is: Inpatient Remains inpatient appropriate because: Of severity of illness.  Need for IV diuresis.  Consultants: None  Procedures: Echo as above Antimicrobials: None   Subjective: Patient seen and examined at bedside.  Continues to have intermittent shortness of breath with exertion but denies any chest pain.  No fever, vomiting, abdominal reported  Objective: Vitals:   02/10/24 0513 02/10/24 1200 02/10/24 2008 02/11/24 0500  BP: (!) 152/99 (!) 147/97 132/83   Pulse: 88 90 87   Resp: 17  17   Temp: 98.1 F (36.7 C) (!) 97.5 F (36.4 C) 97.7 F (36.5 C)   TempSrc: Oral Oral Oral   SpO2: 97% 94% 97%   Weight: 81.4 kg   77.3 kg  Height:        Intake/Output Summary (Last 24 hours) at 02/11/2024 0752 Last data filed at 02/11/2024 0530 Gross per 24 hour  Intake 960 ml  Output 7200 ml  Net -6240 ml   Filed Weights   02/09/24 1656 02/10/24 0513 02/11/24 0500  Weight: 79.3 kg 81.4 kg 77.3 kg    Examination:  General: On room air.  No distress ENT/neck: No thyromegaly.  JVD is not elevated  respiratory: Decreased breath sounds at bases bilaterally with some crackles; no wheezing  CVS: S1-S2 heard, rate controlled currently Abdominal: Soft, nontender, slightly distended; no organomegaly, bowel sounds are heard Extremities: Lower extremity edema bilaterally; no cyanosis  CNS: Awake  and alert.  No focal neurologic deficit.  Moves extremities Lymph: No obvious lymphadenopathy Skin: No obvious ecchymosis/lesions  psych: Mostly flat affect.  Not agitated currently. musculoskeletal: No obvious joint swelling/deformity       Data Reviewed: I have personally reviewed following labs and imaging studies  CBC: Recent Labs  Lab 02/09/24 1625 02/10/24 0533  WBC 13.4* 10.0  HGB 16.2* 14.7  HCT 46.6* 43.1  MCV 90.3 90.5  PLT 314 257   Basic Metabolic Panel: Recent Labs  Lab 02/09/24 1625 02/09/24 2315 02/10/24 0533  NA 130*  --  134*  K 4.4  --  3.4*  CL 94*  --  99  CO2 25  --  27  GLUCOSE 440* 536* 160*  BUN 25*  --  21*  CREATININE 0.43*  --  0.56  CALCIUM 8.3*  --  8.0*  MG 1.9  --   --    GFR: Estimated Creatinine Clearance: 87.9 mL/min (by C-G formula based on SCr of 0.56 mg/dL). Liver Function Tests: Recent Labs  Lab 02/09/24 1625  AST 38  ALT 43  ALKPHOS 176*  BILITOT 0.4  PROT 6.2*  ALBUMIN 2.4*   No results for input(s): LIPASE, AMYLASE in the last 168 hours. No results for input(s): AMMONIA in the last 168 hours. Coagulation Profile: No results for input(s): INR, PROTIME in the last 168 hours. Cardiac Enzymes: No results for input(s): CKTOTAL, CKMB, CKMBINDEX, TROPONINI in the last 168 hours. BNP (last 3 results) No results for input(s): PROBNP in the last 8760 hours. HbA1C: No results for input(s): HGBA1C in the last 72 hours. CBG: Recent Labs  Lab 02/10/24 1610 02/10/24 2222 02/11/24 0621 02/11/24 0639 02/11/24 0642  GLUCAP 154* 137* 43* 122* 111*   Lipid Profile: No results for input(s): CHOL, HDL, LDLCALC, TRIG, CHOLHDL, LDLDIRECT in the last 72 hours. Thyroid Function Tests: No results for input(s): TSH, T4TOTAL, FREET4, T3FREE, THYROIDAB in the last 72 hours. Anemia Panel: No results for input(s): VITAMINB12, FOLATE, FERRITIN, TIBC, IRON, RETICCTPCT in the last 72 hours. Sepsis Labs: No results for input(s): PROCALCITON, LATICACIDVEN in the last 168 hours.  No results found for this or any previous visit (from the past 240 hours).       Radiology Studies: ECHOCARDIOGRAM  COMPLETE Result Date: 02/10/2024    ECHOCARDIOGRAM REPORT   Patient Name:   Christie Anderson Date of Exam: 02/10/2024 Medical Rec #:  [id number]      Height:       69.0 in Accession #:    [id number]     Weight:       179.5 lb Date of Birth:  17-Sep-1975      BSA:          1.973 m Patient Age:    48 years       BP:           152/99 mmHg Patient Gender: F              HR:           83 bpm. Exam Location:  [facility] Procedure: 2D Echo, Cardiac Doppler, Color Doppler and Strain Analysis (Both            Spectral and Color Flow Doppler were utilized during procedure). Indications:    I50.40* Unspecified combined systolic (congestive) and diastolic                 (congestive) heart failure  History:  Patient has no prior history of Echocardiogram examinations.                 CHF; Risk Factors:Diabetes.  Sonographer:    Ellouise Mose RDCS Referring Phys: [id number] JACOB JINNY PEEL  Sonographer Comments: Delay to get patient in bed. IMPRESSIONS  1. Left ventricular ejection fraction, by estimation, is 35 to 40%. The left ventricle has moderately decreased function. The left ventricle demonstrates global hypokinesis. There is moderate concentric left ventricular hypertrophy. Left ventricular diastolic parameters are consistent with Grade I diastolic dysfunction (impaired relaxation). Elevated left atrial pressure. The average left ventricular global longitudinal strain is -13.5 %. The global longitudinal strain is abnormal.  2. Right ventricular systolic function is mildly reduced. The right ventricular size is normal. Tricuspid regurgitation signal is inadequate for assessing PA pressure.  3. Left atrial size was mildly dilated.  4. Large pleural effusion in the left lateral region.  5. The mitral valve is normal in structure. Trivial mitral valve regurgitation.  6. The aortic valve is tricuspid. Aortic valve regurgitation is not visualized. No aortic stenosis is present.  7. The inferior vena cava is normal in size with greater  than 50% respiratory variability, suggesting right atrial pressure of 3 mmHg. Comparison(s): Marked LVH with low QRS voltage on ECG suggests infiltrative cardiomyopathy, in particular cardiac amyloidosis. Conclusion(s)/Recommendation(s): Consider cardiac MRI or PYP nuclear scintigraphy for infiltrative cardiomyopathy. FINDINGS  Left Ventricle: Left ventricular ejection fraction, by estimation, is 35 to 40%. The left ventricle has moderately decreased function. The left ventricle demonstrates global hypokinesis. The average left ventricular global longitudinal strain is -13.5 %. Strain was performed and the global longitudinal strain is abnormal. The left ventricular internal cavity size was normal in size. There is moderate concentric left ventricular hypertrophy. Left ventricular diastolic parameters are consistent with Grade I diastolic dysfunction (impaired relaxation). Elevated left atrial pressure. Right Ventricle: The right ventricular size is normal. No increase in right ventricular wall thickness. Right ventricular systolic function is mildly reduced. Tricuspid regurgitation signal is inadequate for assessing PA pressure. Left Atrium: Left atrial size was mildly dilated. Right Atrium: Right atrial size was normal in size. Pericardium: There is no evidence of pericardial effusion. Mitral Valve: The mitral valve is normal in structure. Mild to moderate mitral annular calcification. Trivial mitral valve regurgitation. Tricuspid Valve: The tricuspid valve is normal in structure. Tricuspid valve regurgitation is not demonstrated. Aortic Valve: The aortic valve is tricuspid. Aortic valve regurgitation is not visualized. No aortic stenosis is present. Pulmonic Valve: The pulmonic valve was not well visualized. Pulmonic valve regurgitation is trivial. Aorta: The aortic root and ascending aorta are structurally normal, with no evidence of dilitation. Venous: The inferior vena cava is normal in size with greater than  50% respiratory variability, suggesting right atrial pressure of 3 mmHg. IAS/Shunts: No atrial level shunt detected by color flow Doppler. Additional Comments: There is a large pleural effusion in the left lateral region.  LEFT VENTRICLE PLAX 2D LVIDd:         4.40 cm      Diastology LVIDs:         3.50 cm      LV e' medial:    4.24 cm/s LV PW:         1.50 cm      LV E/e' medial:  12.2 LV IVS:        1.65 cm      LV e' lateral:   3.70 cm/s LVOT diam:  2.20 cm      LV E/e' lateral: 13.9 LV SV:         54 LV SV Index:   27           2D Longitudinal Strain LVOT Area:     3.80 cm     2D Strain GLS Avg:     -13.5 %  LV Volumes (MOD) LV vol d, MOD A2C: 126.2 ml LV vol d, MOD A4C: 123.0 ml LV vol s, MOD A2C: 89.2 ml LV vol s, MOD A4C: 72.2 ml LV SV MOD A2C:     37.0 ml LV SV MOD A4C:     123.0 ml LV SV MOD BP:      43.7 ml RIGHT VENTRICLE             IVC RV S prime:     10.10 cm/s  IVC diam: 1.70 cm TAPSE (M-mode): 1.6 cm LEFT ATRIUM           Index        RIGHT ATRIUM          Index LA diam:      3.40 cm 1.72 cm/m   RA Area:     9.11 cm LA Vol (A2C): 31.0 ml 15.71 ml/m  RA Volume:   16.50 ml 8.36 ml/m LA Vol (A4C): 47.0 ml 23.81 ml/m  AORTIC VALVE LVOT Vmax:   86.40 cm/s LVOT Vmean:  55.300 cm/s LVOT VTI:    0.142 m  AORTA Ao Root diam: 3.50 cm Ao Asc diam:  3.60 cm MITRAL VALVE MV Area (PHT): 3.91 cm    SHUNTS MV Decel Time: 194 msec    Systemic VTI:  0.14 m MV E velocity: 51.60 cm/s  Systemic Diam: 2.20 cm MV A velocity: 66.50 cm/s MV E/A ratio:  0.78 Mihai Croitoru MD Electronically signed by Jerel Balding MD Signature Date/Time: 02/10/2024/1:31:24 PM    Final    DG Chest Portable 1 View Result Date: 02/09/2024 CLINICAL DATA:  Difficulty breathing as well as difficulty walking. Patient states fluid buildup. EXAM: PORTABLE CHEST 1 VIEW COMPARISON:  11/29/2023 FINDINGS: Lungs are somewhat hypoinflated with interval worsening of a moderate size right pleural effusion and small left pleural effusion. There is  likely associated bibasilar atelectasis. Infection in the lung bases is possible. Remainder of the exam is unchanged. IMPRESSION: Interval worsening of moderate size right pleural effusion and small left pleural effusion with likely associated bibasilar atelectasis. Infection in the lung bases is possible. Electronically Signed   By: Toribio Agreste M.D.   On: 02/09/2024 16:54        Scheduled Meds:  dextrose   2 Tube Oral STAT   enoxaparin  (LOVENOX ) injection  40 mg Subcutaneous QHS   furosemide   40 mg Intravenous BID   insulin  aspart  0-20 Units Subcutaneous TID WC   insulin  aspart  0-5 Units Subcutaneous QHS   insulin  glargine  30 Units Subcutaneous QHS   losartan   50 mg Oral Daily   potassium chloride   40 mEq Oral BID   Continuous Infusions:        Sophie Mao, MD [facility] 02/11/2024, 7:52 AM

## 2024-02-11 NOTE — Plan of Care (Signed)

## 2024-02-11 NOTE — TOC Initial Note (Signed)
   Transition of Care [facility]) - Inpatient Brief Assessment   Patient Details  Name: Christie Anderson MRN: [id number] Date of Birth: 1976-04-12  Transition of [facility]) CM/SW Contact:    Sharlyne Stabs, RN Phone Number: 02/11/2024, 10:57 AM   Clinical Narrative:  Patient from home admitted with acute CHF.  Patient has a PCP, no health insurance. CHF education added to AVS. TOC following.   Transition of Care Asessment: Insurance and Status: Selfpay Patient has primary care physician: Yes Home environment has been reviewed: Home with children Prior level of function:: Independent Prior/Current Home Services: No current home services Social Drivers of Health Review: SDOH reviewed no interventions necessary Readmission risk has been reviewed: Yes Transition of care needs: no transition of care needs at this time  Patient Details  Name: Christie Anderson MRN: [id number] Date of Birth: Sep 03, 1975   Barriers to Discharge: Continued Medical Work up   Expected Discharge Plan and Services    Home     Activities of Daily Living   ADL Screening (condition at time of admission) Independently performs ADLs?: Yes (appropriate for developmental age) Is the patient deaf or have difficulty hearing?: No Does the patient have difficulty seeing, even when wearing glasses/contacts?: No Does the patient have difficulty concentrating, remembering, or making decisions?: No  Permission Sought/Granted         Admission diagnosis:  Acute CHF (congestive heart failure) (HCC) [I50.9] Acute systolic congestive heart failure (HCC) [I50.21] New onset of congestive heart failure (HCC) [I50.9] Patient Active Problem List   Diagnosis Date Noted   Acute CHF (congestive heart failure) (HCC) 02/09/2024   Type 2 diabetes mellitus with hyperglycemia, with long-term current use of insulin  (HCC) 12/08/2023   Abnormal chest x-ray 12/08/2023   Weakness of both lower extremities 11/14/2023   PCP:  Grooms,  [place], PA-C Pharmacy:   [facility] - [place], [place] - [address] [place] [address] Phone: [phone] Fax: [phone]    Social Drivers of Health (SDOH) Social History: SDOH Screenings   Food Insecurity: No Food Insecurity (02/10/2024)  Housing: Low Risk  (02/10/2024)  Transportation Needs: No Transportation Needs (02/10/2024)  Utilities: Not At Risk (02/10/2024)  Depression (PHQ2-9): High Risk (11/14/2023)  Tobacco Use: High Risk (02/09/2024)   SDOH Interventions:

## 2024-02-11 NOTE — Progress Notes (Signed)
 Pt complained that she is not feeling well. Pt blood glucose checked and it was 43. 30 grams of carb given per protocol. Rechecked pt blood glucose and it was 122. On call provider notified and aware.

## 2024-02-11 NOTE — Progress Notes (Addendum)
 Pt with noticeable fluid decrease in face and abd today. Pt states is breathing much better today than yesterday. Still complains of severe weakness, requires one to two person assist to stand up from chair or potty chair.  Pt has very poor muscle mass over entire body. Pt's ribs much more pronounced today now that fluid has decreased. Family member states that pt has been steadily losing muscle mass since June of this year despite attempting to eat more food more often and increasing protein intake. Dietary consult entered.  Pt states she saw PMD PA in early June of this year for swelling of feet/legs and severe weakness with multiple falls at home. States PA told pt that she noted no/poor reflexes in knees and legs and referred pt to neurologist for follow up. PA also dx pt with diabetes and started her on Lantus  insulin .  Friend states she took pt for neurologist visit in July but arrived 10 minutes late due to pt difficulty getting out of car and neurologist's office refused to see her and rescheduled her for February 20, 2024.   Pt and family state since seeing PA in June pt has had several tele-health follow-up visits with PMD PA because she has been unable to walk and get out of house due to severe swelling and weakness but PA did not address the fluid/swelling.  Pt's S.O. (son's father) recently passed away. 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 Pt now lives with her 23 year old son in his home but he does not provide any care or assistance for patient.  Pt's family and friends have contacted APS and social services to secure assistance for pt for food, medicaid, medical assistance, etc., but no services have been put in place at this time. TOC consult entered.

## 2024-02-12 ENCOUNTER — Other Ambulatory Visit (HOSPITAL_COMMUNITY): Payer: Self-pay

## 2024-02-12 ENCOUNTER — Encounter (HOSPITAL_COMMUNITY): Payer: Self-pay | Admitting: Family Medicine

## 2024-02-12 DIAGNOSIS — I5020 Unspecified systolic (congestive) heart failure: Secondary | ICD-10-CM

## 2024-02-12 DIAGNOSIS — R002 Palpitations: Secondary | ICD-10-CM

## 2024-02-12 DIAGNOSIS — J90 Pleural effusion, not elsewhere classified: Secondary | ICD-10-CM

## 2024-02-12 DIAGNOSIS — I5021 Acute systolic (congestive) heart failure: Secondary | ICD-10-CM

## 2024-02-12 DIAGNOSIS — I10 Essential (primary) hypertension: Secondary | ICD-10-CM

## 2024-02-12 LAB — BASIC METABOLIC PANEL WITH GFR
Anion gap: 4 — ABNORMAL LOW (ref 5–15)
BUN: 17 mg/dL (ref 6–20)
CO2: 31 mmol/L (ref 22–32)
Calcium: 7.7 mg/dL — ABNORMAL LOW (ref 8.9–10.3)
Chloride: 103 mmol/L (ref 98–111)
Creatinine, Ser: 0.36 mg/dL — ABNORMAL LOW (ref 0.44–1.00)
GFR, Estimated: >60 mL/min (ref >60)
Glucose, Bld: 111 mg/dL — ABNORMAL HIGH (ref 70–99)
Potassium: 3.8 mmol/L (ref 3.5–5.1)
Sodium: 138 mmol/L (ref 135–145)

## 2024-02-12 LAB — GASTROINTESTINAL PANEL BY PCR, STOOL (REPLACES STOOL CULTURE)

## 2024-02-12 LAB — MAGNESIUM: Magnesium: 1.9 mg/dL (ref 1.7–2.4)

## 2024-02-12 LAB — GLUCOSE, CAPILLARY
Glucose-Capillary: 95 mg/dL (ref 70–99)
Glucose-Capillary: 161 mg/dL — ABNORMAL HIGH (ref 70–99)
Glucose-Capillary: 134 mg/dL — ABNORMAL HIGH (ref 70–99)
Glucose-Capillary: 135 mg/dL — ABNORMAL HIGH (ref 70–99)

## 2024-02-12 MED ORDER — DAPAGLIFLOZIN PROPANEDIOL 10 MG PO TABS
10.0000 mg | ORAL_TABLET | Freq: Every day | ORAL | Status: DC
  Administered 2024-02-12 – 2024-02-13 (×2): 10 mg via ORAL
  Filled 2024-02-12 (×2): qty 1

## 2024-02-12 MED ORDER — SACUBITRIL-VALSARTAN 24-26 MG PO TABS
1.0000 | ORAL_TABLET | Freq: Two times a day (BID) | ORAL | Status: DC
  Administered 2024-02-13 – 2024-02-15 (×6): 1 via ORAL
  Filled 2024-02-12 (×6): qty 1

## 2024-02-12 NOTE — Plan of Care (Signed)
   Problem: Education: Goal: Knowledge of General Education information will improve Description: Including pain rating scale, medication(s)/side effects and non-pharmacologic comfort measures Outcome: Progressing   Problem: Activity: Goal: Risk for activity intolerance will decrease Outcome: Progressing   Problem: Nutrition: Goal: Adequate nutrition will be maintained Outcome: Progressing

## 2024-02-12 NOTE — Plan of Care (Signed)

## 2024-02-12 NOTE — Consult Note (Incomplete)
 Advanced Heart Failure Team Consult Note   Primary Physician: Grooms, [place], [place] Cardiologist:  None  Reason for Consultation: New HFrEF  HPI:    Christie Anderson is seen today for evaluation of HFrEF at the request of Dr. Debera with [facility]. 48 y.o. female with history of tobacco use/  Has not received routine medical care over the last 10 years. Reports history of elevated blood glucose but has not been treated for diabetes. She has not been seen in primary care office for some time. Her Mom passed away from heart failure in her 30s.   Admitted to [facility] on 02/09/24 with worsening heart failure symptoms X 2 weeks. Had gained about 50 lb since March and noticed onset of lower extremity edema around that time. Echo with LVEF 35-40%, moderate LVH (however, low voltage ECG), RV mildly reduced. Cardiology consulted. Concern for possible infiltrative cardiomyopathy. Patient appeared markedly volume overloaded. She was started on IV lasix  and GDMT titrated.  She was transferred to [facility] for workup/management by Advanced Heart Failure team.  Complaining leg edema. SOB with exertion.    Home Medications Prior to Admission medications   Medication Sig Start Date End Date Taking? Authorizing Provider  insulin  glargine (LANTUS ) 100 UNIT/ML injection Inject 0.15 mLs (15 Units total) into the skin daily. 12/18/23  Yes Grooms, [place], PA-C  Insulin  Syringe-Needle U-100 (INSULIN  SYRINGE .5CC/30GX5/16) 30G X 5/16 0.5 ML MISC Use one syringe and needle per insulin  injection. 12/13/23   Grooms, Charmaine, PA-C    Past Medical History: Past Medical History:  Diagnosis Date   Diabetes [facility])     Past Surgical History: History reviewed. No pertinent surgical history.  Family History: Family History  Problem Relation Age of Onset   Hypertension Mother    Stroke Mother    Stroke Paternal Grandmother     Social History: Social History   Socioeconomic History   Marital status: Single     Spouse name: Not on file   Number of children: 1   Years of education: Not on file   Highest education level: GED or equivalent  Occupational History   Occupation: Unemployed  Tobacco Use   Smoking status: Every Day    Types: Cigarettes   Smokeless tobacco: Never  Substance and Sexual Activity   Alcohol use: Not on file   Drug use: Not on file   Sexual activity: Not on file  Other Topics Concern   Not on file  Social History Narrative   Not on file   Social Drivers of Health   Financial Resource Strain: Not on file  Food Insecurity: No Food Insecurity (02/10/2024)   Hunger Vital Sign    Worried About Running Out of Food in the Last Year: Never true    Ran Out of Food in the Last Year: Never true  Transportation Needs: No Transportation Needs (02/10/2024)   PRAPARE - Administrator, Civil Service (Medical): No    Lack of Transportation (Non-Medical): No  Physical Activity: Not on file  Stress: Not on file  Social Connections: Not on file    Allergies:  Allergies  Allergen Reactions   Beef-Derived Drug Products Hives and Swelling   Pork-Derived Products Hives and Swelling   Amoxicillin Other (See Comments)    Unknown    Doxycycline Nausea And Vomiting   Penicillins Other (See Comments)    Unknown     Objective:    Vital Signs:   Temp:  [98 F (36.7 C)-98.4  F (36.9 C)] 98 F (36.7 C) (09/23 1259) Pulse Rate:  [71-79] 74 (09/23 1259) Resp:  [17-19] 19 (09/23 1259) BP: (112-143)/(64-90) 132/77 (09/23 1259) SpO2:  [92 %-97 %] 97 % (09/23 1259) Weight:  [72.9 kg] 72.9 kg (09/23 0412) Last BM Date : 02/12/24  Weight change: Filed Weights   02/11/24 0500 02/12/24 0300 02/13/24 0412  Weight: 77.3 kg 76.5 kg 72.9 kg    Intake/Output:   Intake/Output Summary (Last 24 hours) at 02/13/2024 1505 Last data filed at 02/13/2024 1357 Gross per 24 hour  Intake 1006 ml  Output 3700 ml  Net -2694 ml      Physical Exam    General:   No resp  difficulty Neck: JVP to jaw Cor: Regular rate & rhythm.  Lungs: clear Abdomen: soft, nontender, nondistended.  Extremities: R and LLE 3+  edema to abdomen.  Neuro: alert & oriented x3   Telemetry   SR  EKG    EKG on admit SR with low volts.   Labs   Basic Metabolic Panel: Recent Labs  Lab 02/09/24 1625 02/09/24 2315 02/10/24 0533 02/11/24 0837 02/12/24 0528 02/13/24 0228  NA 130*  --  134* 138 138 136  K 4.4  --  3.4* 3.7 3.8 3.6  CL 94*  --  99 100 103 101  CO2 25  --  27 29 31 28   GLUCOSE 440* 536* 160* 71 111* 56*  BUN 25*  --  21* 19 17 18   CREATININE 0.43*  --  0.56 0.36* 0.36* 0.39*  CALCIUM 8.3*  --  8.0* 8.0* 7.7* 7.9*  MG 1.9  --   --  1.9 1.9 1.8    Liver Function Tests: Recent Labs  Lab 02/09/24 1625  AST 38  ALT 43  ALKPHOS 176*  BILITOT 0.4  PROT 6.2*  ALBUMIN 2.4*   No results for input(s): LIPASE, AMYLASE in the last 168 hours. No results for input(s): AMMONIA in the last 168 hours.  CBC: Recent Labs  Lab 02/09/24 1625 02/10/24 0533  WBC 13.4* 10.0  HGB 16.2* 14.7  HCT 46.6* 43.1  MCV 90.3 90.5  PLT 314 257    Cardiac Enzymes: No results for input(s): CKTOTAL, CKMB, CKMBINDEX, TROPONINI in the last 168 hours.  BNP: BNP (last 3 results) Recent Labs    02/09/24 1625  BNP 1,135.0*    ProBNP (last 3 results) No results for input(s): PROBNP in the last 8760 hours.   CBG: Recent Labs  Lab 02/12/24 2114 02/13/24 0426 02/13/24 0445 02/13/24 0508 02/13/24 1256  GLUCAP 135* 52* 57* 109* 150*    Coagulation Studies: No results for input(s): LABPROT, INR in the last 72 hours.   Imaging   US  EKG SITE RITE Result Date: 02/13/2024 If Site Rite image not attached, placement could not be confirmed due to current cardiac rhythm.    Medications:     Current Medications:  Chlorhexidine  Gluconate Cloth  6 each Topical Daily   empagliflozin   10 mg Oral Daily   enoxaparin  (LOVENOX ) injection  40 mg  Subcutaneous QHS   insulin  aspart  0-20 Units Subcutaneous TID WC   insulin  aspart  0-5 Units Subcutaneous QHS   insulin  glargine  15 Units Subcutaneous QHS   potassium chloride   40 mEq Oral BID   sacubitril -valsartan   1 tablet Oral BID   sodium chloride  flush  10-40 mL Intracatheter Q12H    Infusions:  furosemide  (LASIX ) 200 mg in dextrose  5 % 100 mL (2 mg/mL) infusion  10 mg/hr (02/13/24 1346)      Patient Profile  She has not received any healthcare in > 10 years. H/O tobacco abuse.   A/C HFrEF possible infiltrative disease.   Assessment/Plan   1. Acute HFrEF Echo LVEF 35-40% RV mildly reduced. Marked LVH with low volts on EKG.  ? Infiltrative disease. BNP elevated. No previous cardiac diagnosis. Mom died from HF.  Check myeloma panel. Will need eventual cath with biopsy. CMRI once diuresed.  Marked volume overload. Give 80 mg IV lasix  and start lasix  10 mg per hour. Check CVP and CO-OX  Add spiro 25 mg daily  Continue entresto  24-26 mg twice a day.  Follow renal function.   2. Uncontrolled DM  Hgb A1C 14.  On SSI  Needs PCP follow up.  3. Pleural Effusion R>L  Continue to diurese with IV lasix . May need thoracentesis.   4. Tobacco Abuse   Length of Stay: 4   Advanced Heart Failure Team Pager [phone] (M-F; 7a - 5p)  Please contact [facility] for night-coverage after hours (4p -7a ) and weekends on [email or web address]

## 2024-02-12 NOTE — Progress Notes (Signed)
 PROGRESS NOTE    Christie Anderson  FMW:[id number] DOB: February 04, 1976 DOA: 02/09/2024 PCP: Mancil Pfeiffer, PA-C   Brief Narrative:  48 year old female with history of recent diagnosis of diabetes mellitus type 2 on insulin  presented with worsening shortness of breath, orthopnea and lower extremity swelling.  On presentation, chest x-ray showed interval worsening of moderate size right pleural effusion and small left pleural effusion with likely associated bibasilar atelectasis.  She was started on IV Lasix  for possible acute CHF.  Assessment & Plan:   Acute unspecified CHF Bilateral pleural effusion -Presented with worsening shortness of breath, orthopnea and lower extremity swelling.  On presentation, chest x-ray showed interval worsening of moderate size right pleural effusion and small left pleural effusion with likely associated bibasilar atelectasis. 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 Patient is currently hemodynamically stable, on room air with stable vitals except for slightly elevated blood pressure intermittently. I spoke to Dr. Camnitz/cardiology on-call on 02/10/2024 who recommended to continue diuresis for now and transition to oral diuretics over the weekend if stable or have inpatient cardiology evaluate her on Monday at [facility]. No need for transfer to [facility] at this time. He also recommended to start ARB and/or beta-blocker at some point.  Losartan  50 mg daily started on 02/10/2024.  Awaiting cardiology evaluation today. -Continue strict input and output.  Daily weights.  Fluid restriction.  Continue IV Lasix  every 12 hours for now.  Diuresing well: Negative fluid balance of 6080 cc since admission.  Monitor BMP.    Hyponatremia - Resolved  Hypokalemia - Resolved  Leukocytosis -Resolved  Diabetes mellitus type 2 with hyperglycemia and  hypoglycemia -Carb modified diet.  Continue CBGs with SSI.  Continue decreased dose of nightly long-acting insulin   Hypertension - Blood pressure improving.  Continue Lasix  and losartan .  Will hold off on starting beta-blockers now.  DVT prophylaxis: Lovenox  Code Status: Full Family Communication: None at bedside Disposition Plan: Status is: Inpatient Remains inpatient appropriate because: Of severity of illness.  Need for IV diuresis.  Consultants: Cardiology evaluation pending.  Procedures: Echo as above Antimicrobials: None   Subjective: Patient seen and examined at bedside.  Shortness of breath is slightly improving.  Denies any chest pain, worsening abdominal pain or vomiting. Objective: Vitals:   02/11/24 1550 02/11/24 1900 02/12/24 0300 02/12/24 0447  BP: (!) 141/93 138/87  126/84  Pulse: 85 98  73  Resp: 18 18  18   Temp: 98 F (36.7 C) 98.3 F (36.8 C)  97.6 F (36.4 C)  TempSrc: Oral Oral  Oral  SpO2: 95% 93%  95%  Weight:   76.5 kg   Height:        Intake/Output Summary (Last 24 hours) at 02/12/2024 0753 Last data filed at 02/11/2024 2200 Gross per 24 hour  Intake 960 ml  Output --  Net 960 ml   Filed Weights   02/10/24 0513 02/11/24 0500 02/12/24 0300  Weight: 81.4 kg 77.3 kg 76.5 kg    Examination:  General: Remains on room air and in no distress.   ENT/neck: No palpable neck masses or elevated JVD noted  respiratory: Bilateral decreased breath sounds at bases with scattered crackles CVS: Rate mostly controlled; S1 and S2 are heard  abdominal: Soft, nontender, distended mildly; no organomegaly, bowel sounds are heard normally Extremities: No clubbing; bilateral lower extremity edema present  CNS: Alert and oriented.  No focal  neurologic deficit.  Able to move extremities Lymph: No obvious palpable lymphadenopathy Skin: No obvious petechiae/rashes psych: Affect is currently flat with no signs of agitation  musculoskeletal: No obvious joint  tenderness/erythema      Data Reviewed: I have personally reviewed following labs and imaging studies  CBC: Recent Labs  Lab 02/09/24 1625 02/10/24 0533  WBC 13.4* 10.0  HGB 16.2* 14.7  HCT 46.6* 43.1  MCV 90.3 90.5  PLT 314 257   Basic Metabolic Panel: Recent Labs  Lab 02/09/24 1625 02/09/24 2315 02/10/24 0533 02/11/24 0837 02/12/24 0528  NA 130*  --  134* 138 138  K 4.4  --  3.4* 3.7 3.8  CL 94*  --  99 100 103  CO2 25  --  27 29 31   GLUCOSE 440* 536* 160* 71 111*  BUN 25*  --  21* 19 17  CREATININE 0.43*  --  0.56 0.36* 0.36*  CALCIUM 8.3*  --  8.0* 8.0* 7.7*  MG 1.9  --   --  1.9 1.9   GFR: Estimated Creatinine Clearance: 87.9 mL/min (A) (by C-G formula based on SCr of 0.36 mg/dL (L)). Liver Function Tests: Recent Labs  Lab 02/09/24 1625  AST 38  ALT 43  ALKPHOS 176*  BILITOT 0.4  PROT 6.2*  ALBUMIN 2.4*   No results for input(s): LIPASE, AMYLASE in the last 168 hours. No results for input(s): AMMONIA in the last 168 hours. Coagulation Profile: No results for input(s): INR, PROTIME in the last 168 hours. Cardiac Enzymes: No results for input(s): CKTOTAL, CKMB, CKMBINDEX, TROPONINI in the last 168 hours. BNP (last 3 results) No results for input(s): PROBNP in the last 8760 hours. HbA1C: No results for input(s): HGBA1C in the last 72 hours. CBG: Recent Labs  Lab 02/11/24 1132 02/11/24 1153 02/11/24 1702 02/11/24 2001 02/12/24 0738  GLUCAP 73 78 99 194* 95   Lipid Profile: No results for input(s): CHOL, HDL, LDLCALC, TRIG, CHOLHDL, LDLDIRECT in the last 72 hours. Thyroid Function Tests: No results for input(s): TSH, T4TOTAL, FREET4, T3FREE, THYROIDAB in the last 72 hours. Anemia Panel: No results for input(s): VITAMINB12, FOLATE, FERRITIN, TIBC, IRON, RETICCTPCT in the last 72 hours. Sepsis Labs: No results for input(s): PROCALCITON, LATICACIDVEN in the last 168 hours.  Recent  Results (from the past 240 hours)  C Difficile Quick Screen w PCR reflex     Status: None   Collection Time: 02/11/24 10:26 AM   Specimen: STOOL  Result Value Ref Range Status   C Diff antigen NEGATIVE NEGATIVE Final   C Diff toxin NEGATIVE NEGATIVE Final   C Diff interpretation No C. difficile detected.  Final    Comment: Performed at [facility], [address], [place], [place] [address]         Radiology Studies: ECHOCARDIOGRAM COMPLETE Result Date: 02/10/2024    ECHOCARDIOGRAM REPORT   Patient Name:   SIMARA RHYNER Date of Exam: 02/10/2024 Medical Rec #:  [id number]      Height:       69.0 in Accession #:    [id number]     Weight:       179.5 lb Date of Birth:  10-08-1975      BSA:          1.973 m Patient Age:    48 years       BP:           152/99 mmHg Patient Gender: F  HR:           83 bpm. Exam Location:  [facility] Procedure: 2D Echo, Cardiac Doppler, Color Doppler and Strain Analysis (Both            Spectral and Color Flow Doppler were utilized during procedure). Indications:    I50.40* Unspecified combined systolic (congestive) and diastolic                 (congestive) heart failure  History:        Patient has no prior history of Echocardiogram examinations.                 CHF; Risk Factors:Diabetes.  Sonographer:    Ellouise Mose RDCS Referring Phys: [id number] JACOB JINNY PEEL  Sonographer Comments: Delay to get patient in bed. IMPRESSIONS  1. Left ventricular ejection fraction, by estimation, is 35 to 40%. The left ventricle has moderately decreased function. The left ventricle demonstrates global hypokinesis. There is moderate concentric left ventricular hypertrophy. Left ventricular diastolic parameters are consistent with Grade I diastolic dysfunction (impaired relaxation). Elevated left atrial pressure. The average left ventricular global longitudinal strain is -13.5 %. The global longitudinal strain is abnormal.  2. Right ventricular systolic function is mildly reduced. The  right ventricular size is normal. Tricuspid regurgitation signal is inadequate for assessing PA pressure.  3. Left atrial size was mildly dilated.  4. Large pleural effusion in the left lateral region.  5. The mitral valve is normal in structure. Trivial mitral valve regurgitation.  6. The aortic valve is tricuspid. Aortic valve regurgitation is not visualized. No aortic stenosis is present.  7. The inferior vena cava is normal in size with greater than 50% respiratory variability, suggesting right atrial pressure of 3 mmHg. Comparison(s): Marked LVH with low QRS voltage on ECG suggests infiltrative cardiomyopathy, in particular cardiac amyloidosis. Conclusion(s)/Recommendation(s): Consider cardiac MRI or PYP nuclear scintigraphy for infiltrative cardiomyopathy. FINDINGS  Left Ventricle: Left ventricular ejection fraction, by estimation, is 35 to 40%. The left ventricle has moderately decreased function. The left ventricle demonstrates global hypokinesis. The average left ventricular global longitudinal strain is -13.5 %. Strain was performed and the global longitudinal strain is abnormal. The left ventricular internal cavity size was normal in size. There is moderate concentric left ventricular hypertrophy. Left ventricular diastolic parameters are consistent with Grade I diastolic dysfunction (impaired relaxation). Elevated left atrial pressure. Right Ventricle: The right ventricular size is normal. No increase in right ventricular wall thickness. Right ventricular systolic function is mildly reduced. Tricuspid regurgitation signal is inadequate for assessing PA pressure. Left Atrium: Left atrial size was mildly dilated. Right Atrium: Right atrial size was normal in size. Pericardium: There is no evidence of pericardial effusion. Mitral Valve: The mitral valve is normal in structure. Mild to moderate mitral annular calcification. Trivial mitral valve regurgitation. Tricuspid Valve: The tricuspid valve is normal in  structure. Tricuspid valve regurgitation is not demonstrated. Aortic Valve: The aortic valve is tricuspid. Aortic valve regurgitation is not visualized. No aortic stenosis is present. Pulmonic Valve: The pulmonic valve was not well visualized. Pulmonic valve regurgitation is trivial. Aorta: The aortic root and ascending aorta are structurally normal, with no evidence of dilitation. Venous: The inferior vena cava is normal in size with greater than 50% respiratory variability, suggesting right atrial pressure of 3 mmHg. IAS/Shunts: No atrial level shunt detected by color flow Doppler. Additional Comments: There is a large pleural effusion in the left lateral region.  LEFT VENTRICLE PLAX 2D LVIDd:  4.40 cm      Diastology LVIDs:         3.50 cm      LV e' medial:    4.24 cm/s LV PW:         1.50 cm      LV E/e' medial:  12.2 LV IVS:        1.65 cm      LV e' lateral:   3.70 cm/s LVOT diam:     2.20 cm      LV E/e' lateral: 13.9 LV SV:         54 LV SV Index:   27           2D Longitudinal Strain LVOT Area:     3.80 cm     2D Strain GLS Avg:     -13.5 %  LV Volumes (MOD) LV vol d, MOD A2C: 126.2 ml LV vol d, MOD A4C: 123.0 ml LV vol s, MOD A2C: 89.2 ml LV vol s, MOD A4C: 72.2 ml LV SV MOD A2C:     37.0 ml LV SV MOD A4C:     123.0 ml LV SV MOD BP:      43.7 ml RIGHT VENTRICLE             IVC RV S prime:     10.10 cm/s  IVC diam: 1.70 cm TAPSE (M-mode): 1.6 cm LEFT ATRIUM           Index        RIGHT ATRIUM          Index LA diam:      3.40 cm 1.72 cm/m   RA Area:     9.11 cm LA Vol (A2C): 31.0 ml 15.71 ml/m  RA Volume:   16.50 ml 8.36 ml/m LA Vol (A4C): 47.0 ml 23.81 ml/m  AORTIC VALVE LVOT Vmax:   86.40 cm/s LVOT Vmean:  55.300 cm/s LVOT VTI:    0.142 m  AORTA Ao Root diam: 3.50 cm Ao Asc diam:  3.60 cm MITRAL VALVE MV Area (PHT): 3.91 cm    SHUNTS MV Decel Time: 194 msec    Systemic VTI:  0.14 m MV E velocity: 51.60 cm/s  Systemic Diam: 2.20 cm MV A velocity: 66.50 cm/s MV E/A ratio:  0.78 Mihai Croitoru  MD Electronically signed by Jerel Balding MD Signature Date/Time: 02/10/2024/1:31:24 PM    Final         Scheduled Meds:  enoxaparin  (LOVENOX ) injection  40 mg Subcutaneous QHS   furosemide   40 mg Intravenous BID   insulin  aspart  0-20 Units Subcutaneous TID WC   insulin  aspart  0-5 Units Subcutaneous QHS   insulin  glargine  20 Units Subcutaneous QHS   losartan   50 mg Oral Daily   potassium chloride   40 mEq Oral BID   Continuous Infusions:        Sophie Mao, MD [facility] 02/12/2024, 7:53 AM

## 2024-02-12 NOTE — Progress Notes (Signed)
 Pt discharged with [facility] at 2130. Pt belongings with her that she brought to hospital. Vitals, CBG Check, and 2200 scheduled meds given before discharge.

## 2024-02-12 NOTE — Progress Notes (Signed)
 Heart Failure Nurse Navigator Progress Note  PCP: Grooms, [place], PA-C PCP-Cardiologist: None Admission Diagnosis: New Onset Congestive Heart Failure  Admitted from: [name] :Called remotely to her room @ [facility].   2 Patient identifiers confirmed prior to HF education and interview.  Presentation:   Christie Anderson is a 48 y.o. female who presented with shortness of breath, orthopnea, lower extremity swelling. Patient said she had 40 lbs of fluid that had built up. She could not get up or walk. This has been going on since March. Also recent T2DM diagnosis in June, prior to this was only taking insulin . BNP 1135, HS-Troponin was 18. Chest x-ray noted moderate size right pleural effusion and small left pleural effusion with likely associated bibasilar atelectasis.  Patient is a continuous smoker.   ECHO/ LVEF: 35-40 %   Clinical Course:  Past Medical History:  Diagnosis Date   Diabetes (HCC)      Social History   Socioeconomic History   Marital status: Single    Spouse name: Not on file   Number of children: 1   Years of education: Not on file   Highest education level: GED or equivalent  Occupational History   Occupation: Unemployed  Tobacco Use   Smoking status: Every Day    Types: Cigarettes   Smokeless tobacco: Never  Substance and Sexual Activity   Alcohol use: Not on file   Drug use: Not on file   Sexual activity: Not on file  Other Topics Concern   Not on file  Social History Narrative   Not on file   Social Drivers of Health   Financial Resource Strain: Not on file  Food Insecurity: No Food Insecurity (02/10/2024)   Hunger Vital Sign    Worried About Running Out of Food in the Last Year: Never true    Ran Out of Food in the Last Year: Never true  Transportation Needs: No Transportation Needs (02/10/2024)   PRAPARE - Administrator, Civil Service (Medical): No    Lack of Transportation (Non-Medical): No  Physical  Activity: Not on file  Stress: Not on file  Social Connections: Not on file   Education Assessment and Provision:  Detailed education and instructions provided on heart failure disease management including the following:  Signs and symptoms of Heart Failure When to call the physician Importance of daily weights Low sodium diet Fluid restriction Medication management Anticipated future follow-up appointments  Patient education given on each of the above topics.  Patient acknowledges understanding via teach back method and acceptance of all instructions.  Education Materials:  Living Better With Heart Failure Booklet, HF zone tool, & Daily Weight Tracker Tool.  Patient has scale at home: Yes Patient has pill box at home: Yes    High Risk Criteria for Readmission and/or Poor Patient Outcomes: Heart failure hospital admissions (last 6 months): 1  No Show rate: 13% Difficult social situation: Transportation Demonstrates medication adherence: Yes prior to this new diagnosis.  She is concerned about Heart Failure Medication affordability now since she is unemployed. Primary Language: English Literacy level: Reading, Writing & Comprehension  Barriers of Care:   Transportation- patient has a friend to drive her to the scheduled TOC appointment. Food-patient looking into [employer] on [employer]- currently lives with her son New HF medication affordability  Considerations/Referrals:  Referral made to Heart Failure Pharmacist Stewardship: Yes Referral made to Heart Failure CSW/NCM TOC: No Referral made to Heart & Vascular TOC clinic:  Yes. [facility]. 02/22/24 @ 2:45 PM.  Items for Follow-up on DC/TOC: Diet & Fluid Restrictions Daily Weights Continued Heart Failure Education-patient was not feeling well due to receiving an insulin  dose when HF education provided.  Will need to review that with her at [facility] appt. Smoking Cessation  Charmaine Pines, RN,  BSN [facility] Heart Failure Navigator Secure Chat Only

## 2024-02-12 NOTE — Consult Note (Signed)
 Cardiology Consultation   Patient ID: Christie Anderson MRN: [id number]; DOB: January 29, 1976  Admit date: 02/09/2024 Date of Consult: 02/12/2024  PCP:  Christie Pfeiffer, PA-C   [facility] Providers Cardiologist:  None   - NEW    Patient Profile: Christie Anderson is a 48 y.o. female with a hx of diabetes on insulin  who is being seen 02/12/2024 for the evaluation of new onset HFrEF at the request of Dr. Cheryle.  History of Present Illness: Christie Anderson has never been seen by heart care prior or any other cardiologist per Care Everywhere review.  Presented to [facility] ED on 9/19 for worsening SOB, orthopnea, and edema. EKG: NSR, HR 98, low voltage QRS, Q waves in V1/2 (no previous comparison)  CXR showed worsening of moderate right size pleural effusion and small left pleural effusion with likely associated bibasilar atelectasis.  Infection in the lung bases is possible.  Echo 02/10/2024 showed EF 35 to 45%, global hypokinesis, moderate concentric LVH, G1 DD, elevated LA pressure, abnormal global longitudinal strain of -13.5%, mildly reduced RV function, mildly dilated LA, large pleural effusion in left lateral region, trivial MV regurgitation.  Noted marked LVH with low QRS voltage on ECG suggest infiltrative cardiomyopathy, in particular cardiac amyloidosis.  Consider cardiac MRI or PYP for infiltrative cardiomyopathy.   Significant initial labs include K 4.4 now 3.8, CR 0.43 now 0.36, BNP 1135, TN 18 > 18, CBC with elevated WBC 13.4/Hgb 16.2 but now WNL, glucose 440 now 111, negative HIV/C. difficile stool test  Treated with IV Lasix  20 mg X2 then increased to 40 mg X4.  Also treated with KCl supplement and started on losartan  50 mg daily. Net I/O is -6,080, wt 174 > 168  On interview, patient reports SOB started approximately 2 weeks ago associated with minimal exertion and talking and then rapidly worsened 3 to 4 days prior to hospital visit occurring at rest and when lying down, however improved  some with standing.  Also reports LE edema started in late February/early March and has progressively gotten worse with no improvement from leg elevation.  Notes that fluid was noticeable up yp upper thighs and abdomen.  Also notes a 50 pound weight gain since March with baseline weight of 135 pounds.  Notes a history of brief occasional palpitations described as heart racing lasting for seconds occurring about once every 6 months that was never evaluated.  Also notes neuropathy in feet about 6 months prior to this episode.  Denies any chest pain, dizziness, syncope.  Currently, reports significant improvement in SOB and edema.  Patient is able to walk around the room without SOB.  Prior to hospitalization, patient's only known medical history is diabetes, which was not treated.  Family cardiac history includes stroke (mom, maternal grandmother), and HTN (mom).  Admits to low-sodium heart healthy diet.  Patient has not been as active as usual due to the symptoms.  Patient previously worked as an Print production planner at [employer] but quit due to these ongoing health concerns.  Patient smokes 1/2-1 PPD with plans to stop due to this hospitalization.  Denies any EtOH or drugs.                                                   Past Medical History:  Diagnosis Date   Diabetes (HCC)  History reviewed. No pertinent surgical history.   Home Medications:  Prior to Admission medications   Medication Sig Start Date End Date Taking? Authorizing Provider  insulin  glargine (LANTUS ) 100 UNIT/ML injection Inject 0.15 mLs (15 Units total) into the skin daily. 12/18/23  Yes Christie Anderson, [place], PA-C  Insulin  Syringe-Needle U-100 (INSULIN  SYRINGE .5CC/30GX5/16) 30G X 5/16 0.5 ML MISC Use one syringe and needle per insulin  injection. 12/13/23   Christie Anderson, [place], PA-C    Scheduled Meds:  enoxaparin  (LOVENOX ) injection  40 mg Subcutaneous QHS   furosemide   40 mg Intravenous BID   insulin  aspart  0-20 Units Subcutaneous  TID WC   insulin  aspart  0-5 Units Subcutaneous QHS   insulin  glargine  20 Units Subcutaneous QHS   losartan   50 mg Oral Daily   potassium chloride   40 mEq Oral BID   Continuous Infusions:  PRN Meds: ondansetron  **OR** ondansetron  (ZOFRAN ) IV, polyethylene glycol  Allergies:    Allergies  Allergen Reactions   Beef-Derived Drug Products Hives and Swelling   Pork-Derived Products Hives and Swelling   Amoxicillin Other (See Comments)    Unknown    Doxycycline Nausea And Vomiting   Penicillins Other (See Comments)    Unknown     Social History:   Social History   Socioeconomic History   Marital status: Single    Spouse name: Not on file   Number of children: Not on file   Years of education: Not on file   Highest education level: Not on file  Occupational History   Not on file  Tobacco Use   Smoking status: Every Day   Smokeless tobacco: Never  Substance and Sexual Activity   Alcohol use: Not on file   Drug use: Not on file   Sexual activity: Not on file  Other Topics Concern   Not on file  Social History Narrative   Not on file   Family History:   Family History  Problem Relation Age of Onset   Hypertension Mother    Stroke Mother    Stroke Paternal Grandmother      ROS:  Please see the history of present illness.  All other ROS reviewed and negative.     Physical Exam/Data: Vitals:   02/11/24 1550 02/11/24 1900 02/12/24 0300 02/12/24 0447  BP: (!) 141/93 138/87  126/84  Pulse: 85 98  73  Resp: 18 18  18   Temp: 98 F (36.7 C) 98.3 F (36.8 C)  97.6 F (36.4 C)  TempSrc: Oral Oral  Oral  SpO2: 95% 93%  95%  Weight:   76.5 kg   Height:        Intake/Output Summary (Last 24 hours) at 02/12/2024 0849 Last data filed at 02/11/2024 2200 Gross per 24 hour  Intake 960 ml  Output --  Net 960 ml      02/12/2024    3:00 AM 02/11/2024    5:00 AM 02/10/2024    5:13 AM  Last 3 Weights  Weight (lbs) 168 lb 10.4 oz 170 lb 6.7 oz 179 lb 7.3 oz  Weight  (kg) 76.5 kg 77.3 kg 81.4 kg     Body mass index is 24.91 kg/m.  General:  Well nourished, well developed, in no acute distress HEENT: normal Neck: +JVD Vascular: No carotid bruits; Distal pulses 2+ bilaterally Cardiac:  normal S1, S2; RRR; no murmur  Lungs:  diminished lung sound worse on right than left  Abd: soft, nontender, no hepatomegaly  Ext: 2+ pitting edema  Musculoskeletal:  No deformities, BUE and BLE strength normal and equal Skin: warm and dry  Neuro:  CNs 2-12 intact, no focal abnormalities noted Psych:  Normal affect   EKG:  The EKG was personally reviewed and demonstrates:  NSR, HR 98, low voltage QRS, Q waves in V1/2 (no previous comparison)  Telemetry:  Telemetry was personally reviewed and demonstrates:  Nsr, HR 70-90's  Relevant CV Studies: ECHO IMPRESSIONS 02/10/2024  1. Left ventricular ejection fraction, by estimation, is 35 to 40%. The  left ventricle has moderately decreased function. The left ventricle  demonstrates global hypokinesis. There is moderate concentric left  ventricular hypertrophy. Left ventricular  diastolic parameters are consistent with Grade I diastolic dysfunction  (impaired relaxation). Elevated left atrial pressure. The average left  ventricular global longitudinal strain is -13.5 %. The global longitudinal  strain is abnormal.   2. Right ventricular systolic function is mildly reduced. The right  ventricular size is normal. Tricuspid regurgitation signal is inadequate  for assessing PA pressure.   3. Left atrial size was mildly dilated.   4. Large pleural effusion in the left lateral region.   5. The mitral valve is normal in structure. Trivial mitral valve  regurgitation.   6. The aortic valve is tricuspid. Aortic valve regurgitation is not  visualized. No aortic stenosis is present.   7. The inferior vena cava is normal in size with greater than 50%  respiratory variability, suggesting right atrial pressure of 3 mmHg.    Comparison(s): Marked LVH with low QRS voltage on ECG suggests  infiltrative cardiomyopathy, in particular cardiac amyloidosis.   Conclusion(s)/Recommendation(s): Consider cardiac MRI or PYP nuclear  scintigraphy for infiltrative cardiomyopathy.   Laboratory Data: High Sensitivity Troponin:   Recent Labs  Lab 02/09/24 1625 02/09/24 1943  TROPONINIHS 18* 18*     Chemistry Recent Labs  Lab 02/09/24 1625 02/09/24 2315 02/10/24 0533 02/11/24 0837 02/12/24 0528  NA 130*  --  134* 138 138  K 4.4  --  3.4* 3.7 3.8  CL 94*  --  99 100 103  CO2 25  --  27 29 31   GLUCOSE 440*   < > 160* 71 111*  BUN 25*  --  21* 19 17  CREATININE 0.43*  --  0.56 0.36* 0.36*  CALCIUM 8.3*  --  8.0* 8.0* 7.7*  MG 1.9  --   --  1.9 1.9  GFRNONAA >60  --  >60 >60 >60  ANIONGAP 11  --  8 9 4*   < > = values in this interval not displayed.    Recent Labs  Lab 02/09/24 1625  PROT 6.2*  ALBUMIN 2.4*  AST 38  ALT 43  ALKPHOS 176*  BILITOT 0.4   Lipids No results for input(s): CHOL, TRIG, HDL, LABVLDL, LDLCALC, CHOLHDL in the last 168 hours.  Hematology Recent Labs  Lab 02/09/24 1625 02/10/24 0533  WBC 13.4* 10.0  RBC 5.16* 4.76  HGB 16.2* 14.7  HCT 46.6* 43.1  MCV 90.3 90.5  MCH 31.4 30.9  MCHC 34.8 34.1  RDW 12.5 12.6  PLT 314 257   Thyroid No results for input(s): TSH, FREET4 in the last 168 hours.  BNP Recent Labs  Lab 02/09/24 1625  BNP 1,135.0*    DDimer No results for input(s): DDIMER in the last 168 hours.  Radiology/Studies:   DG Chest Portable 1 View Result Date: 02/09/2024 CLINICAL DATA:  Difficulty breathing as well as difficulty walking. Patient states fluid buildup. EXAM: PORTABLE CHEST 1  VIEW COMPARISON:  11/29/2023 FINDINGS: Lungs are somewhat hypoinflated with interval worsening of a moderate size right pleural effusion and small left pleural effusion. There is likely associated bibasilar atelectasis. Infection in the lung bases is possible.  Remainder of the exam is unchanged. IMPRESSION: Interval worsening of moderate size right pleural effusion and small left pleural effusion with likely associated bibasilar atelectasis. Infection in the lung bases is possible. Electronically Signed   By: Toribio Agreste M.D.   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 BNP 1135, TN 18 > 18.  Echo 02/10/2024 showed EF 35 to 45%, global hypokinesis, moderate concentric LVH, G1 DD, elevated LA pressure, abnormal global longitudinal strain of -13.5%, mildly reduced RV function, mildly dilated LA, large pleural effusion in left lateral region, trivial MV regurgitation.  Findings suggestive of infiltrative cardiomyopathy.  Consider cardiac MRI or PYP. Treated with IV Lasix  20 mg X2 then increased to 40 mg X4.  Also treated with KCl supplement and started on losartan  50 mg daily. Net I/O is -6,080, wt 174 > 168 (baseline wt per patient is 135 in 07/2023), Cr 0.43 now 0.36. Currently, notes improvement in DOE and edema. Still appears volume overloaded. Currently on IV Lasix  40 mg twice daily.  Would consider increasing IV Lasix  to 60 mg twice daily.  Continue on losartan  50 mg daily With volume overload, would consider starting low-dose spironolactone  for additional diuresis. With diabetes, would consider starting SGLT2 inhibitors prior to discharge.  Denies any history of recurrent UTIs. Discussed cardiac MRI vs PYP. Patient agrees to move forward. Patient would prefer to get testing done while inpatient since transportation is limited.   HTN BP this a.m. well-controlled 126/84 Continue losartan  50 mg daily  Palpitations  Notes a history of brief occasional palpitations described as heart racing lasting for seconds occurring about once every 6 months that was never evaluated ZIO monitor would  not be beneficial with current frequency.  Discussed contacting the office if increase in frequency or duration for further evaluation.  Risk Assessment/Risk Scores:  [place]  Heart Association (NYHA) Functional Class NYHA Class IV     For questions or updates, please contact [facility] Please consult [email or web address] for contact info under      Signed, Lorette CINDERELLA Kapur, PA-C  02/12/2024 8:49 AM

## 2024-02-12 NOTE — Progress Notes (Signed)
 Heart Failure Stewardship Pharmacy Note  PCP: Grooms, [place], [place] PCP-Cardiologist: None  HPI: Christie Anderson is a 48 y.o. female with T2DM who presented with shortness of breath, orthopnea, and lower extremity swelling. On admission, BNP was 1135, HS-troponin was 18. Chest x-ray noted moderate size right pleural effusion and small left pleural effusion with likely associated bibasilar atelectasis. TTE 02/10/24 showed LVEF of 35-40%, moderate concentric LVH, G1DD, mildly reduced RV function.  Pertinent Lab Values: Creatinine, Ser  Date Value Ref Range Status  02/12/2024 0.36 (L) 0.44 - 1.00 mg/dL Final   BUN  Date Value Ref Range Status  02/12/2024 17 6 - 20 mg/dL Final  93/72/7974 10 6 - 24 mg/dL Final   Potassium  Date Value Ref Range Status  02/12/2024 3.8 3.5 - 5.1 mmol/L Final   Sodium  Date Value Ref Range Status  02/12/2024 138 135 - 145 mmol/L Final  11/17/2023 131 (L) 134 - 144 mmol/L Final   B Natriuretic Peptide  Date Value Ref Range Status  02/09/2024 1,135.0 (H) 0.0 - 100.0 pg/mL Final    Comment:    Performed at [facility], [address] [place], [place] [address]   Magnesium   Date Value Ref Range Status  02/12/2024 1.9 1.7 - 2.4 mg/dL Final    Comment:    Performed at [facility], [address] [place], [place] [address]   Hgb A1c MFr Bld  Date Value Ref Range Status  11/29/2023 14.4 (H) 4.8 - 5.6 % Final    Comment:             Prediabetes: 5.7 - 6.4          Diabetes: >6.4          Glycemic control for adults with diabetes: <7.0    TSH  Date Value Ref Range Status  11/14/2023 4.050 0.450 - 4.500 uIU/mL Final    Vital Signs: Admission weight: Temp:  [97.5 F (36.4 C)-98.3 F (36.8 C)] 97.6 F (36.4 C) (09/22 0447) Pulse Rate:  [73-98] 73 (09/22 0447) Cardiac Rhythm: Normal sinus rhythm (09/21 1900) Resp:  [18] 18 (09/22 0447) BP: (126-141)/(84-93) 126/84 (09/22 0447) SpO2:  [93 %-96 %] 95 % (09/22 0447) Weight:  [76.5 kg (168 lb  10.4 oz)] 76.5 kg (168 lb 10.4 oz) (09/22 0300)  Intake/Output Summary (Last 24 hours) at 02/12/2024 0727 Last data filed at 02/11/2024 2200 Gross per 24 hour  Intake 960 ml  Output --  Net 960 ml    Current Heart Failure Medications:  Loop diuretic: furosemide  40 mg IV BID Beta-Blocker: none ACEI/ARB/ARNI: Entresto  24-26 mg BID MRA: none SGLT2i: none Other: none  Prior to admission Heart Failure Medications:  None  Assessment: 1. Acute combined systolic and diastolic heart failure (LVEF 35-40%) with G1DD and mildly reduced RV function, due to unknown etiology. NYHA class III symptoms.  -Symptoms: Reports significant improvement in shortness of breath and lower extremity edema. Does not feel at baseline yet.  -Volume: Hypervolemic. Excellent urine output, 7L yesterday. Weight down significantly. Continue furosemide  40 mg IV BID. -Hemodynamics: BP remains elevated, HR 70s. -BB: Consider adding after euvolemia is reached prior to discharge. -ACEI/ARB/ARNI: Losartan  transitioned to Entresto . Patient does not currently have insurance. There is no longer a 30 day free card, so patient may require transition back to losartan  until insurance is approved. -MRA: Consider adding spironolactone  25 mg daily.  -SGLT2i:Currently on Farxiga  10 mg daily. Given most recent A1c was 14 and patient does not have insurance  coverage, it may be ideal to hold SGLT2i until A1c <10. If decided to continue on therapy, Farxiga  no longer has a 30 day free card, so she will need Jardiance  sent.  Plan: 1) Medication changes recommended at this time: -Consider adding spironolactone  25 mg daily -Consider transition back to losartan  50 mg daily until insurance application is approved. Current cash price of Entresto  is ~80 with coupons and the patient can not afford.  2) Patient assistance: -Medicaid application pending. Can consider applying for patient assistance on Entresto  and Jardiance .  3) Education: -  Patient has been educated on current HF medications and potential additions to HF medication regimen - Patient verbalizes understanding that over the next few months, these medication doses may change and more medications may be added to optimize HF regimen - Patient has been educated on basic disease state pathophysiology and goals of therapy  Medication Assistance / Insurance Benefits Check: Does the patient have prescription insurance?    Type of insurance plan:  Does the patient qualify for medication assistance through manufacturers or grants? Pending  Eligible grants and/or patient assistance programs: pending  Medication assistance applications in progress: pending  Medication assistance applications approved: pending Approved medication assistance renewals will be completed by: pending  Outpatient Pharmacy: Prior to admission outpatient pharmacy: none      Please do not hesitate to reach out with questions or concerns,  Jaun Bash, PharmD, CPP, BCPS, [facility] Heart Failure Pharmacist  Phone - [phone] 02/12/2024 12:03 PM

## 2024-02-13 ENCOUNTER — Other Ambulatory Visit: Payer: Self-pay

## 2024-02-13 ENCOUNTER — Other Ambulatory Visit (HOSPITAL_COMMUNITY): Payer: Self-pay

## 2024-02-13 DIAGNOSIS — I10 Essential (primary) hypertension: Secondary | ICD-10-CM

## 2024-02-13 DIAGNOSIS — E1165 Type 2 diabetes mellitus with hyperglycemia: Secondary | ICD-10-CM

## 2024-02-13 DIAGNOSIS — E876 Hypokalemia: Secondary | ICD-10-CM

## 2024-02-13 DIAGNOSIS — Z794 Long term (current) use of insulin: Secondary | ICD-10-CM

## 2024-02-13 DIAGNOSIS — E43 Unspecified severe protein-calorie malnutrition: Secondary | ICD-10-CM

## 2024-02-13 LAB — BASIC METABOLIC PANEL WITH GFR
Anion gap: 7 (ref 5–15)
BUN: 18 mg/dL (ref 6–20)
CO2: 28 mmol/L (ref 22–32)
Calcium: 7.9 mg/dL — ABNORMAL LOW (ref 8.9–10.3)
Chloride: 101 mmol/L (ref 98–111)
Creatinine, Ser: 0.39 mg/dL — ABNORMAL LOW (ref 0.44–1.00)
GFR, Estimated: >60 mL/min (ref >60)
Glucose, Bld: 56 mg/dL — ABNORMAL LOW (ref 70–99)
Potassium: 3.6 mmol/L (ref 3.5–5.1)
Sodium: 136 mmol/L (ref 135–145)

## 2024-02-13 LAB — URINALYSIS, ROUTINE W REFLEX MICROSCOPIC
Bilirubin Urine: NEGATIVE
Glucose, UA: >=500 mg/dL — AB
Hgb urine dipstick: NEGATIVE
Ketones, ur: NEGATIVE mg/dL
Leukocytes,Ua: NEGATIVE
Nitrite: NEGATIVE
Protein, ur: 30 mg/dL — AB
Specific Gravity, Urine: 1.003 — ABNORMAL LOW (ref 1.005–1.030)
pH: 7 (ref 5.0–8.0)

## 2024-02-13 LAB — GLUCOSE, CAPILLARY
Glucose-Capillary: 52 mg/dL — ABNORMAL LOW (ref 70–99)
Glucose-Capillary: 109 mg/dL — ABNORMAL HIGH (ref 70–99)
Glucose-Capillary: 57 mg/dL — ABNORMAL LOW (ref 70–99)
Glucose-Capillary: 150 mg/dL — ABNORMAL HIGH (ref 70–99)
Glucose-Capillary: 89 mg/dL (ref 70–99)
Glucose-Capillary: 217 mg/dL — ABNORMAL HIGH (ref 70–99)

## 2024-02-13 LAB — MAGNESIUM: Magnesium: 1.8 mg/dL (ref 1.7–2.4)

## 2024-02-13 MED ORDER — SPIRONOLACTONE 25 MG PO TABS
25.0000 mg | ORAL_TABLET | Freq: Every day | ORAL | Status: DC
  Administered 2024-02-13 – 2024-02-16 (×4): 25 mg via ORAL
  Filled 2024-02-13 (×4): qty 1

## 2024-02-13 MED ORDER — INSULIN GLARGINE 100 UNIT/ML ~~LOC~~ SOLN
15.0000 [IU] | Freq: Every day | SUBCUTANEOUS | Status: DC
  Administered 2024-02-13: 15 [IU] via SUBCUTANEOUS
  Filled 2024-02-13 (×2): qty 0.15

## 2024-02-13 MED ORDER — EMPAGLIFLOZIN 10 MG PO TABS
10.0000 mg | ORAL_TABLET | Freq: Every day | ORAL | Status: DC
  Administered 2024-02-13 – 2024-02-15 (×3): 10 mg via ORAL
  Filled 2024-02-13 (×3): qty 1

## 2024-02-13 MED ORDER — SODIUM CHLORIDE 0.9% FLUSH: 10.0000 mL | INTRAVENOUS | Status: DC | PRN

## 2024-02-13 MED ORDER — SODIUM CHLORIDE 0.9% FLUSH
10.0000 mL | Freq: Two times a day (BID) | INTRAVENOUS | Status: DC
  Administered 2024-02-13: 10 mL
  Administered 2024-02-13: 20 mL
  Administered 2024-02-14 – 2024-02-15 (×3): 10 mL

## 2024-02-13 MED ORDER — FUROSEMIDE 10 MG/ML IJ SOLN
80.0000 mg | Freq: Once | INTRAMUSCULAR | Status: AC
  Administered 2024-02-13: 80 mg via INTRAVENOUS
  Filled 2024-02-13: qty 8

## 2024-02-13 MED ORDER — ENSURE MAX PROTEIN PO LIQD
11.0000 [oz_av] | Freq: Two times a day (BID) | ORAL | Status: DC
  Administered 2024-02-13 – 2024-02-15 (×4): 11 [oz_av] via ORAL
  Filled 2024-02-13 (×7): qty 330

## 2024-02-13 MED ORDER — MAGNESIUM SULFATE 2 GM/50ML IV SOLN
2.0000 g | Freq: Once | INTRAVENOUS | Status: AC
  Administered 2024-02-13: 2 g via INTRAVENOUS
  Filled 2024-02-13: qty 50

## 2024-02-13 MED ORDER — CHLORHEXIDINE GLUCONATE CLOTH 2 % EX PADS
6.0000 | MEDICATED_PAD | Freq: Every day | CUTANEOUS | Status: DC
  Administered 2024-02-13 – 2024-02-16 (×4): 6 via TOPICAL

## 2024-02-13 MED ORDER — ACETAZOLAMIDE 250 MG PO TABS
500.0000 mg | ORAL_TABLET | Freq: Two times a day (BID) | ORAL | Status: DC
  Administered 2024-02-13: 500 mg via ORAL
  Filled 2024-02-13: qty 2

## 2024-02-13 MED ORDER — FUROSEMIDE 10 MG/ML IJ SOLN
20.0000 mg/h | INTRAVENOUS | Status: DC
  Administered 2024-02-13: 10 mg/h via INTRAVENOUS
  Administered 2024-02-14 – 2024-02-16 (×5): 20 mg/h via INTRAVENOUS
  Filled 2024-02-13 (×6): qty 20

## 2024-02-13 NOTE — Progress Notes (Signed)
 Peripherally Inserted Central Catheter Placement  The IV Nurse has discussed with the patient and/or persons authorized to consent for the patient, the purpose of this procedure and the potential benefits and risks involved with this procedure.  The benefits include less needle sticks, lab draws from the catheter, and the patient may be discharged home with the catheter. Risks include, but not limited to, infection, bleeding, blood clot (thrombus formation), and puncture of an artery; nerve damage and irregular heartbeat and possibility to perform a PICC exchange if needed/ordered by physician.  Alternatives to this procedure were also discussed.  Bard Power PICC patient education guide, fact sheet on infection prevention and patient information card has been provided to patient /or left at bedside.    PICC Placement Documentation  PICC Double Lumen 02/13/24 Right Basilic 36 cm 0 cm (Active)  Indication for Insertion or Continuance of Line Vasoactive infusions 02/13/24 1100  Exposed Catheter (cm) 0 cm 02/13/24 1100  Site Assessment Clean, Dry, Intact 02/13/24 1100  Lumen #1 Status Flushed;Saline locked;Blood return noted 02/13/24 1100  Lumen #2 Status Flushed;Saline locked;Blood return noted 02/13/24 1100  Dressing Type Transparent;Securing device 02/13/24 1100  Dressing Status Antimicrobial disc/dressing in place;Clean, Dry, Intact 02/13/24 1100  Line Care Connections checked and tightened 02/13/24 1100  Line Adjustment (NICU/IV Team Only) No 02/13/24 1100  Dressing Intervention New dressing;Adhesive placed at insertion site (IV team only) 02/13/24 1100  Dressing Change Due 02/20/24 02/13/24 1100       Ethyl Priestly [place] 02/13/2024, 11:48 AM

## 2024-02-13 NOTE — Assessment & Plan Note (Signed)
 Continue blood pressure monitoring Continue sacubitril  valsartan  and aggressive diuresis.

## 2024-02-13 NOTE — TOC Initial Note (Signed)
 Transition of Care [facility]) - Initial/Assessment Note    Patient Details  Name: Christie Anderson MRN: [id number] Date of Birth: 11-03-1975  Transition of [facility]) CM/SW Contact:    Justina Delcia Czar, RN Phone Number: [phone] 02/13/2024, 4:08 PM  Clinical Narrative:                 Spoke to pt at bedside, states she is independent at home. She has been eating out a lot due to no strength to cook at home. Discussed low sodium/heart healthy diet. Pt states she has scale at home for daily weights. Provided pt with Living Better with HF booklet to review.   Will use MATCH/HF funds for medications. Financial Counselor has completed Medicaid application.   Her friend takes her to appts. States she is currently not driving.   Will schedule PCP appt at time of dc.   Expected Discharge Plan: Home/Self Care Barriers to Discharge: Continued Medical Work up   Patient Goals and CMS Choice Patient states their goals for this hospitalization and ongoing recovery are:: wants to get better          Expected Discharge Plan and Services   Discharge Planning Services: CM Consult   Living arrangements for the past 2 months: Single Family Home                                      Prior Living Arrangements/Services Living arrangements for the past 2 months: Single Family Home Lives with:: Adult Children Patient language and need for interpreter reviewed:: Yes Do you feel safe going back to the place where you live?: Yes      Need for Family Participation in Patient Care: No (Comment) Care giver support system in place?: Yes (comment) Current home services: DME (scale) Criminal Activity/Legal Involvement Pertinent to Current Situation/Hospitalization: No - Comment as needed  Activities of Daily Living   ADL Screening (condition at time of admission) Independently performs ADLs?: Yes (appropriate for developmental age) Is the patient deaf or have difficulty hearing?: No Does  the patient have difficulty seeing, even when wearing glasses/contacts?: No Does the patient have difficulty concentrating, remembering, or making decisions?: No  Permission Sought/Granted Permission sought to share information with : Case Manager, Family Supports, PCP Permission granted to share information with : Yes, Verbal Permission Granted  Share Information with NAME: Garrel Pina  Permission granted to share info w AGENCY: PCP, DME  Permission granted to share info w Relationship: father  Permission granted to share info w Contact Information: [phone]  Emotional Assessment Appearance:: Appears stated age Attitude/Demeanor/Rapport: Engaged Affect (typically observed): Accepting Orientation: : Oriented to Self, Oriented to  Time, Oriented to Place, Oriented to Situation Alcohol / Substance Use: Tobacco Use    Admission diagnosis:  Acute CHF (congestive heart failure) (HCC) [I50.9] Acute systolic congestive heart failure (HCC) [I50.21] New onset of congestive heart failure (HCC) [I50.9] Patient Active Problem List   Diagnosis Date Noted   Essential hypertension 02/13/2024   Hypokalemia 02/13/2024   HFrEF (heart failure with reduced ejection fraction) (HCC) 02/12/2024   Acute on chronic systolic CHF (congestive heart failure) (HCC) 02/09/2024   Type 2 diabetes mellitus with hyperglycemia, with long-term current use of insulin  (HCC) 12/08/2023   Abnormal chest x-ray 12/08/2023   Weakness of both lower extremities 11/14/2023   PCP:  Grooms, [place], PA-C Pharmacy:   [facility] [place],  [place] - [address] [place] [address] Phone: [phone] Fax: [phone]  [facility] [address] [place] [address] Phone: [phone] Fax: [phone]     Social Drivers of Health (SDOH) Social History: SDOH Screenings   Food Insecurity: No Food Insecurity (02/10/2024)  Housing: Low Risk   (02/10/2024)  Transportation Needs: No Transportation Needs (02/10/2024)  Utilities: Not At Risk (02/10/2024)  Depression (PHQ2-9): High Risk (11/14/2023)  Tobacco Use: High Risk (02/12/2024)   SDOH Interventions:     Readmission Risk Interventions     No data to display

## 2024-02-13 NOTE — Inpatient Diabetes Management (Addendum)
 Inpatient Diabetes Program Recommendations  AACE/ADA: New Consensus Statement on Inpatient Glycemic Control (2015)  Target Ranges:  Prepandial:   less than 140 mg/dL      Peak postprandial:   less than 180 mg/dL (1-2 hours)      Critically ill patients:  140 - 180 mg/dL    Latest Reference Range & Units 11/29/23 15:33  Hemoglobin A1C 4.8 - 5.6 % 14.4 (H)  367 mg/dl  (H): Data is abnormally high  Latest Reference Range & Units 02/12/24 07:38 02/12/24 11:39 02/12/24 16:36 02/12/24 21:14  Glucose-Capillary 70 - 99 mg/dL 95 838 (H)  4 units Novolog   134 (H)  3 units Novolog   135 (H)   20 units Lantus   (H): Data is abnormally high  Latest Reference Range & Units 02/13/24 04:26 02/13/24 04:45 02/13/24 05:08  Glucose-Capillary 70 - 99 mg/dL 52 (L) 57 (L) 890 (H)  (L): Data is abnormally low (H): Data is abnormally high   Admit with: SOB/ Acute CHF/ Bilateral pleural effusion   History: Recent Diagnosis of Diabetes (July 2025)  Home DM Meds: Lantus  15 units daily       Humalog  5 units TID with meals  Current Orders: Lantus  20 units at HS      Novolog  Resistant Correction Scale/ SSI (0-20 units) TID AC + HS      Farxiga  10 mg daily    MD- Note Hypoglycemia this AM after receiving 20 units Lantus  last PM  Please consider reducing the Lantus  to 15 units at HS  Please switch pt to Basaglar  insulin  for home.   Pt does not have insurance and Lantus  is costing her $100 per fill.   She can get Basaglar  (basal insulin ) for $35 with a [employer] coupon I gave her.   Needs a Rx for the Basaglar  when she goes home.   She can use up the Lantus  she has and then switch to Basaglar .     Addendum 10:30am--Met w/ pt at bedside.  Pt told me she knows she had diabetes several years ago but was too busy taking care of her mom and her child's father to take care of herself.  Mom has since passed away.  Pt told me she checked her CBG several years back and it was >400 on her mom's CBG meter  (mom had diabetes as well).  Spoke with pt about new diagnosis.  Discussed A1C results with her and explained what an A1C is, basic pathophysiology of DM Type 2, basic home care, basic diabetes diet nutrition principles, importance of checking CBGs and maintaining good CBG control to prevent long-term and short-term complications.  Reviewed signs and symptoms of hyperglycemia and hypoglycemia and how to treat hypoglycemia at home.  Also reviewed blood sugar goals and A1c goals for home.    Pt was started on Lantus  15 units daily at home by her PCP about 1 month ago.  Using vial and syringe.  Pt told me the Lantus  insulin  costs her $100 per fill as she does not have insurance.  Has CBG meter and the supplies are affordable at her current pharmacy.  We talked about the possibility of switching her to Basaglar  insulin  once daily from Lantus .  Basaglar  (with [employer] and current Rx) can be filled for $35.  Pt would like to switch to Basaglar  as it would much more affordable.  Educated patient on insulin  pen use at home.  Reviewed all steps of insulin  pen including attachment of needle, 2-unit air  shot, dialing up dose, giving injection, rotation of injection sites, removing needle, disposal of sharps, storage of unused insulin , disposal of insulin  etc.  Patient able to provide successful return demonstration.  Reviewed troubleshooting with insulin  pen.    Have asked MD (see above) to switch pt to Basaglar  and give her a Rx at time of discharge.      --Will follow patient during hospitalization--  Adina Rudolpho Arrow RN, MSN, CDCES Diabetes Coordinator Inpatient Glycemic Control Team Team Pager: [phone] (8a-5p)

## 2024-02-13 NOTE — Plan of Care (Signed)
  Problem: Education: Goal: Knowledge of General Education information will improve Description: Including pain rating scale, medication(s)/side effects and non-pharmacologic comfort measures Outcome: Progressing   Problem: Clinical Measurements: Goal: Ability to maintain clinical measurements within normal limits will improve Outcome: Progressing Goal: Will remain free from infection Outcome: Progressing Goal: Diagnostic test results will improve Outcome: Progressing Goal: Respiratory complications will improve Outcome: Progressing Goal: Cardiovascular complication will be avoided Outcome: Progressing   Problem: Activity: Goal: Risk for activity intolerance will decrease Outcome: Progressing   Problem: Nutrition: Goal: Adequate nutrition will be maintained Outcome: Progressing   Problem: Elimination: Goal: Will not experience complications related to bowel motility Outcome: Progressing Goal: Will not experience complications related to urinary retention Outcome: Progressing   Problem: Pain Managment: Goal: General experience of comfort will improve and/or be controlled Outcome: Progressing   Problem: Safety: Goal: Ability to remain free from injury will improve Outcome: Progressing   Problem: Skin Integrity: Goal: Risk for impaired skin integrity will decrease Outcome: Progressing   Problem: Activity: Goal: Capacity to carry out activities will improve Outcome: Progressing   Problem: Cardiac: Goal: Ability to achieve and maintain adequate cardiopulmonary perfusion will improve Outcome: Progressing   Problem: Coping: Goal: Ability to adjust to condition or change in health will improve Outcome: Progressing   Problem: Fluid Volume: Goal: Ability to maintain a balanced intake and output will improve Outcome: Progressing   Problem: Metabolic: Goal: Ability to maintain appropriate glucose levels will improve Outcome: Progressing   Problem:  Nutritional: Goal: Maintenance of adequate nutrition will improve Outcome: Progressing   Problem: Skin Integrity: Goal: Risk for impaired skin integrity will decrease Outcome: Progressing   Problem: Tissue Perfusion: Goal: Adequacy of tissue perfusion will improve Outcome: Progressing

## 2024-02-13 NOTE — Assessment & Plan Note (Addendum)
 Echocardiogram with reduced LV systolic function with EF 35 to 40%, global hypokinesis, moderate LVH, grade I diastolic dysfunction (impaired relaxation), RV systolic function with mild reduction, LA with mild dilatation, no significant valvular disease. No pericardial effusion.   Continue volume overloaded Urine output 2,900  Systolic blood pressure 130 mmHg.   Plan to continue aggressive diuresis with furosemide  drip at 10 mg per hr after a bolus of 80 mg.  Continue SGLT 2 inh and sacubitril  valsartan . Spironolactone   Patient will need further work up with cardiac catheterization and cardiac MRI.

## 2024-02-13 NOTE — Progress Notes (Signed)
 Heart Failure Navigator Progress Note  Assessed for Heart & Vascular TOC clinic readiness.  Patient does not meet criteria due to Advanced Heart Failure Team consult. .   Navigator will sign off at this time.    Rhae Hammock, BSN, Scientist, clinical (histocompatibility and immunogenetics) Only

## 2024-02-13 NOTE — Progress Notes (Addendum)
 Progress Note   Patient: Christie Anderson FMW:[id number] DOB: 04-Mar-1976 DOA: 02/09/2024     4 DOS: the patient was seen and examined on 02/13/2024   Brief hospital course: Mrs. Oertel was admitted to the hospital with the working diagnosis of heart failure exacerbation.   48 year old female with history of recent diagnosis of diabetes mellitus type 2 on insulin  presented with worsening dyspnea, orthopnea and lower extremity edema. Progressive and worsening symptoms, reported 40 lbs weight gain, difficulty ambulating due to dyspnea and peripheral edema.  On her initial physical examination her blood pressure was 156/95, HR 96, RR 13 and 02 saturation 92% Lungs with rales bilaterally with no wheezing, heart with S1 and S2 present and regular, with no gallops, no murmurs, positive JVD, abdomen with no distention, soft and non tender, positive lower extremity edema +++.   Na 130, K 4.4 Cl 94 bicarbonate 25 glucose 440 bun 25 cr 0,43  Mg 1,9  AST 38 ALT 43  BNP 1,135 High sensitive troponin 18 and 18  Wbc  13,5 hgb 16,2 plt 314  C diff negative   Chest radiograph with hypoinflation, positive cardiomegaly with bilateral hilar vascular congestion, bilateral pleural effusions more right than left.   EKG 98 bpm, normal axis, qtc 523, sinus rhythm with bilateral atrial enlargement with no significant ST segment or T wave changes. Poor RR wave progression and low voltage.   Patient was placed on furosemide  for diuresis Echocardiogram with reduced LV systolic function   09/23 transferred [facility] to [facility] for further heart failure therapy and diagnostics.   Assessment and Plan: * Acute on chronic systolic CHF (congestive heart failure) (HCC) Echocardiogram with reduced LV systolic function with EF 35 to 40%, global hypokinesis, moderate LVH, grade I diastolic dysfunction (impaired relaxation), RV systolic function with mild reduction, LA with mild dilatation, no significant valvular disease. No  pericardial effusion.   Continue volume overloaded Urine output 2,900  Systolic blood pressure 130 mmHg.   Plan to continue aggressive diuresis with furosemide  drip at 10 mg per hr after a bolus of 80 mg.  Continue SGLT 2 inh and sacubitril  valsartan . Spironolactone   Patient will need further work up with cardiac catheterization and cardiac MRI.   Essential hypertension Continue blood pressure monitoring Continue sacubitril  valsartan  and aggressive diuresis.  Hypokalemia Renal function with serum cr at 0,39 with K at 3,6 and serum bicarbonate at 28  Na 136 and Mg 1,8  Plan to continue diuresis with furosemide  Add KCL 40 meq x2 and 2 g mag sulfate to keep K at 4 and Mg at 2 Follow up renal function and electrolytes in am.   Type 2 diabetes mellitus with hyperglycemia, with long-term current use of insulin  (HCC) Uncontrolled hyperglycemia.   Plan to continue insulin  sliding scale for glucose cover and monitoring Basal insulin  15 units.    Protein-calorie malnutrition, severe Continue with nutritional supplements.         Subjective: Patient is feeling better but continue to have significant lower extremity edema, continue to have dyspnea on exertion, no chest pain   Physical Exam: Vitals:   02/13/24 0412 02/13/24 0727 02/13/24 1259 02/13/24 1535  BP: 112/64 130/84 132/77 130/82  Pulse: 79  74 75  Resp: 17 19 19 18   Temp: 98.4 F (36.9 C) 98 F (36.7 C) 98 F (36.7 C) 98.1 F (36.7 C)  TempSrc: Oral Oral Oral Oral  SpO2: 92% 94% 97% 98%  Weight: 72.9 kg     Height:  Neurology awake and alert ENT with positive pallor with no icterus Cardiovascular with S1 and S2 present and regular, with no gallops or rubs, no murmurs Positive JVD Respiratory with bilateral rales with no wheezing or rhonchi  Abdomen with no distention, soft and non tender Positive lower extremity edema +++ pitting   Data Reviewed:    Family Communication: no family at the bedside    Disposition: Status is: Inpatient Remains inpatient appropriate because: IV diuresis   Planned Discharge Destination: Home     Author: Elidia Toribio Furnace, MD 02/13/2024 4:26 PM  For on call review [email or web address].

## 2024-02-13 NOTE — Assessment & Plan Note (Signed)
 Uncontrolled hyperglycemia.   Plan to continue insulin  sliding scale for glucose cover and monitoring Basal insulin  15 units.

## 2024-02-13 NOTE — Assessment & Plan Note (Signed)
 Continue with nutritional supplements.

## 2024-02-13 NOTE — Assessment & Plan Note (Signed)
 Renal function with serum cr at 0,39 with K at 3,6 and serum bicarbonate at 28  Na 136 and Mg 1,8  Plan to continue diuresis with furosemide  Add KCL 40 meq x2 and 2 g mag sulfate to keep K at 4 and Mg at 2 Follow up renal function and electrolytes in am.

## 2024-02-13 NOTE — Progress Notes (Signed)
 Initial Nutrition Assessment  DOCUMENTATION CODES:   Severe malnutrition in context of chronic illness  INTERVENTION:  Heart healthy/carb modified, 1200 mL Fluid Ensure Max po BID, each supplement provides 150 kcal and 30 grams of protein.  MVI w/minerals daily  Heart Failure/Carbohydrate Consistent Diet education, reinforce as needed   NUTRITION DIAGNOSIS:   Severe Malnutrition related to chronic illness as evidenced by severe muscle depletion, moderate fat depletion, energy intake < 75% for > or equal to 1 month.    GOAL:   Patient will meet greater than or equal to 90% of their needs    MONITOR:   PO intake, Weight trends, Supplement acceptance  REASON FOR ASSESSMENT:   Consult Other (Comment) (New diagnosis of CHF - Very poor muscle mass noted over entire body - Increasing weakness and increasing falls)  ASSESSMENT:   History of recent diagnosis of diabetes mellitus type 2 on insulin  presented with worsening shortness of breath, orthopnea and lower extremity swelling.  On presentation, chest x-ray showed interval worsening of moderate size right pleural effusion and small left pleural effusion with likely associated bibasilar atelectasis.  She was started on IV Lasix  for possible acute CHF.  Met with patient at bedside. Patient with DM for several years with no prior education and new dx of HF. Provided diet education to patient using Heart Healthy Carbohydrate Consistent Medical Nutrition Therapy handout from [facility]. Patient asked multiple question relating best foods to eat at home. Patient may benefit from outpatient referral to see dietitian, however noted patient with difficulty in the past having reliable transportation to medical appointments. Patient reports HF symptoms beginning in Feb/March of this year with significant worsening over the past month. Patient with several months of inconsistent PO intake, usually equating to 1 solid meal per day. Since admission and  improvement of symptoms, patient's appetite has been improving but now notes that she doesn't feel full enough from meals. Discussed adding Ensure Max Protein (30 g per bottle) BID in between meals to help with satiety. Patient notes significant weight gain (~40 lb) related to fluid retention.  Reports dry weight of 135 lbs. Current weight at 161 lbs. Swelling has improved since admission, however +2 pitting edema still noted to BLE.    Labs: Glucose 56 Cr 0.39 Calcium 7.9 CBG: 52-161    Chlorhexidine  Gluconate Cloth  6 each Topical Daily   empagliflozin   10 mg Oral Daily   enoxaparin  (LOVENOX ) injection  40 mg Subcutaneous QHS   insulin  aspart  0-20 Units Subcutaneous TID WC   insulin  aspart  0-5 Units Subcutaneous QHS   insulin  glargine  15 Units Subcutaneous QHS   potassium chloride   40 mEq Oral BID   Ensure Max Protein  11 oz Oral BID BM   sacubitril -valsartan   1 tablet Oral BID   sodium chloride  flush  10-40 mL Intracatheter Q12H       Wt Readings from Last 10 Encounters:  02/13/24 72.9 kg  11/14/23 65.2 kg  02/12/18 62 kg  09/02/16 63 kg  07/15/16 68 kg  07/15/14 83.9 kg  05/22/13 84.4 kg   Weight Information (since admission)     Date/Time Weight Weight in lbs BSA (Calculated - sq m) BMI (Calculated) Who   02/13/24 0412 72.9 kg 160.8 lbs -- 23.74 LI   02/12/24 0300 76.5 kg 168.65 lbs -- 24.89 DV   02/11/24 0500 77.3 kg 170.42 lbs -- 25.15 MM   02/10/24 0513 81.4 kg 179.45 lbs -- 26.49 BG   02/09/24 1656  79.3 kg 174.8 lbs -- 25.8 RW   02/09/24 1551 61.2 kg 135 lbs 1.73 sq meters 19.93 AC       NUTRITION - FOCUSED PHYSICAL EXAM:  Flowsheet Row Most Recent Value  Orbital Region Moderate depletion  Upper Arm Region Severe depletion  Buccal Region Moderate depletion  Temple Region Moderate depletion  Clavicle Bone Region Severe depletion  Clavicle and Acromion Bone Region Severe depletion  Scapular Bone Region Severe depletion  Patellar Region Unable to assess   [edema]  Anterior Thigh Region Unable to assess  [edema]  Posterior Calf Region Unable to assess  [edema]  Edema (RD Assessment) --  [BLE]  Hair Reviewed  Eyes Reviewed  Mouth Reviewed  Skin Reviewed  Nails Reviewed    Diet Order:   Diet Order             Diet heart healthy/carb modified Room service appropriate? Yes; Fluid consistency: Thin; Fluid restriction: 1200 mL Fluid  Diet effective now                   EDUCATION NEEDS:   Education needs have been addressed  Skin:  Skin Assessment: Reviewed RN Assessment  Last BM:  9/22 type 4  Height:   Ht Readings from Last 1 Encounters:  02/09/24 5' 9 (1.753 m)    Weight:   Wt Readings from Last 1 Encounters:  02/13/24 72.9 kg    Ideal Body Weight:  65.9 kg  BMI:  Body mass index is 23.75 kg/m.  Estimated Nutritional Needs:   Kcal:  8351-8022 kcals  Protein:  79-99 grams  Fluid:  <1.5L/d    Lashana Spang, MS, RD, LDN Clinical Dietitian  Please see [facility] for contact information.

## 2024-02-13 NOTE — Plan of Care (Signed)

## 2024-02-13 NOTE — Progress Notes (Incomplete)
 PROGRESS NOTE    Christie Anderson  FMW:[id number] DOB: [birth year] DOA: 02/09/2024 PCP: Grooms, Courtney, PA-C   48/F w recent diagnosis of DM2 on insulin  presented with worsening dyspnea, orthopnea and lower extremity edema. Progressive and worsening symptoms, reported 40 lbs weight gain, difficulty ambulating due to dyspnea and peripheral edema.  Labs- cr 0,43 , BNP 1,135, troponin 18 and 18  CXR w positive cardiomegaly with bilateral hilar vascular congestion, bilateral pleural effusions more right than left.  -placed on furosemide  for diuresis Echocardiogram with reduced LV systolic function  -9/23 transferred from [facility] to [facility] for further heart failure therapy and diagnostics.    Subjective: Feels better, breathing is improving, felt dizzy yesterday  Assessment and Plan:  Acute systolic CHF, new diagnosis -Echo with EF 35 to 40%, moderate LVH, grade I DD, mildly reduced RV  - Still appears volume overloaded, improving with diuresis, 18 L negative -Heart failure team following, currently on Lasix  gtt. -Continue Entresto , Aldactone , -I would lean towards stopping Jardiance  considering uncontrolled DM, follow-up repeat A1c -Monitor orthostasis symptoms - CHF team following, plan for [facility] with biopsy tomorrow a.m, would benefit from [facility] as well - Repeat x-ray, may need thoracentesis.  Uncontrolled type 2 diabetes mellitus with hyperglycemia - A1c was 14 in July, follow-up repeat - Diagnosed 7 years ago, not on any treatment - Poor candidate for SGLT2i, increase long-acting insulin , diabetes education  Essential hypertension -as above  Hypokalemia -replete.   Protein-calorie malnutrition, severe Continue with nutritional supplements.   Hypoalbuminemia  DVT prophylaxis: lovenox  Code Status: Full code Family Communication: None present Disposition Plan:   Consultants:    Procedures:   Antimicrobials:    Objective: Vitals:   02/13/24 0412 02/13/24 0727 02/13/24 1259  02/13/24 1535  BP: 112/64 130/84 132/77 130/82  Pulse: 79  74 75  Resp: 17 19 19 18   Temp: 98.4 F (36.9 C) 98 F (36.7 C) 98 F (36.7 C) 98.1 F (36.7 C)  TempSrc: Oral Oral Oral Oral  SpO2: 92% 94% 97% 98%  Weight: 72.9 kg     Height:        Intake/Output Summary (Last 24 hours) at 02/13/2024 1710 Last data filed at 02/13/2024 1600 Gross per 24 hour  Intake 774.19 ml  Output 4900 ml  Net -4125.81 ml   Filed Weights   02/11/24 0500 02/12/24 0300 02/13/24 0412  Weight: 77.3 kg 76.5 kg 72.9 kg    Examination:  Gen: Awake, Alert, Oriented X 3, chronically ill-appearing HEENT: + JVD Lungs: Good air movement bilaterally, CTAB CVS: S1S2/RRR Abd: soft, Non tender, non distended, BS present Extremities: 2+ edema Skin: no new rashes on exposed skin    Data Reviewed:   CBC: Recent Labs  Lab 02/09/24 1625 02/10/24 0533  WBC 13.4* 10.0  HGB 16.2* 14.7  HCT 46.6* 43.1  MCV 90.3 90.5  PLT 314 257   Basic Metabolic Panel: Recent Labs  Lab 02/09/24 1625 02/09/24 2315 02/10/24 0533 02/11/24 0837 02/12/24 0528 02/13/24 0228  NA 130*  --  134* 138 138 136  K 4.4  --  3.4* 3.7 3.8 3.6  CL 94*  --  99 100 103 101  CO2 25  --  27 29 31 28   GLUCOSE 440* 536* 160* 71 111* 56*  BUN 25*  --  21* 19 17 18   CREATININE 0.43*  --  0.56 0.36* 0.36* 0.39*  CALCIUM 8.3*  --  8.0* 8.0* 7.7* 7.9*  MG 1.9  --   --  1.9 1.9 1.8   GFR: Estimated Creatinine Clearance: 87.9 mL/min (A) (by C-G formula based on SCr of 0.39 mg/dL (L)). Liver Function Tests: Recent Labs  Lab 02/09/24 1625  AST 38  ALT 43  ALKPHOS 176*  BILITOT 0.4  PROT 6.2*  ALBUMIN 2.4*   No results for input(s): LIPASE, AMYLASE in the last 168 hours. No results for input(s): AMMONIA in the last 168 hours. Coagulation Profile: No results for input(s): INR, PROTIME in the last 168 hours. Cardiac Enzymes: No results for input(s): CKTOTAL, CKMB, CKMBINDEX, TROPONINI in the last 168  hours. BNP (last 3 results) No results for input(s): PROBNP in the last 8760 hours. HbA1C: No results for input(s): HGBA1C in the last 72 hours. CBG: Recent Labs  Lab 02/13/24 0426 02/13/24 0445 02/13/24 0508 02/13/24 1256 02/13/24 1616  GLUCAP 52* 57* 109* 150* 89   Lipid Profile: No results for input(s): CHOL, HDL, LDLCALC, TRIG, CHOLHDL, LDLDIRECT in the last 72 hours. Thyroid Function Tests: No results for input(s): TSH, T4TOTAL, FREET4, T3FREE, THYROIDAB in the last 72 hours. Anemia Panel: No results for input(s): VITAMINB12, FOLATE, FERRITIN, TIBC, IRON, RETICCTPCT in the last 72 hours. Urine analysis: No results found for: COLORURINE, APPEARANCEUR, LABSPEC, PHURINE, GLUCOSEU, HGBUR, BILIRUBINUR, KETONESUR, PROTEINUR, UROBILINOGEN, NITRITE, LEUKOCYTESUR Sepsis Labs: @LABRCNTIP (procalcitonin:4,lacticidven:4)  ) Recent Results (from the past 240 hours)  C Difficile Quick Screen w PCR reflex     Status: None   Collection Time: 02/11/24 10:26 AM   Specimen: STOOL  Result Value Ref Range Status   C Diff antigen NEGATIVE NEGATIVE Final   C Diff toxin NEGATIVE NEGATIVE Final   C Diff interpretation No C. difficile detected.  Final    Comment: Performed at [facility], [address], [place], [place] [address]  Gastrointestinal Panel by PCR , Stool     Status: None   Collection Time: 02/11/24 10:26 AM   Specimen: STOOL  Result Value Ref Range Status   Campylobacter species NOT DETECTED NOT DETECTED Final   Plesimonas shigelloides NOT DETECTED NOT DETECTED Final   Salmonella species NOT DETECTED NOT DETECTED Final   Yersinia enterocolitica NOT DETECTED NOT DETECTED Final   Vibrio species NOT DETECTED NOT DETECTED Final   Vibrio cholerae NOT DETECTED NOT DETECTED Final   Enteroaggregative E coli (EAEC) NOT DETECTED NOT DETECTED Final   Enteropathogenic E coli (EPEC) NOT DETECTED NOT DETECTED Final    Enterotoxigenic E coli (ETEC) NOT DETECTED NOT DETECTED Final   Shiga like toxin producing E coli (STEC) NOT DETECTED NOT DETECTED Final   Shigella/Enteroinvasive E coli (EIEC) NOT DETECTED NOT DETECTED Final   Cryptosporidium NOT DETECTED NOT DETECTED Final   Cyclospora cayetanensis NOT DETECTED NOT DETECTED Final   Entamoeba histolytica NOT DETECTED NOT DETECTED Final   Giardia lamblia NOT DETECTED NOT DETECTED Final   Adenovirus F40/41 NOT DETECTED NOT DETECTED Final   Astrovirus NOT DETECTED NOT DETECTED Final   Norovirus GI/GII NOT DETECTED NOT DETECTED Final   Rotavirus A NOT DETECTED NOT DETECTED Final   Sapovirus (I, II, IV, and V) NOT DETECTED NOT DETECTED Final    Comment: Performed at [facility], [address], [place], [place] [address]     Radiology Studies: US  EKG SITE RITE Result Date: 02/13/2024 If Site Rite image not attached, placement could not be confirmed due to current cardiac rhythm.    Scheduled Meds:  acetaZOLAMIDE   500 mg Oral BID   Chlorhexidine  Gluconate Cloth  6 each Topical Daily   empagliflozin   10 mg Oral Daily   enoxaparin  (LOVENOX ) injection  40 mg Subcutaneous QHS   furosemide   80 mg Intravenous Once   insulin  aspart  0-20 Units Subcutaneous TID WC   insulin  aspart  0-5 Units Subcutaneous QHS   insulin  glargine  15 Units Subcutaneous QHS   potassium chloride   40 mEq Oral BID   Ensure Max Protein  11 oz Oral BID BM   sacubitril -valsartan   1 tablet Oral BID   sodium chloride  flush  10-40 mL Intracatheter Q12H   spironolactone   25 mg Oral Daily   Continuous Infusions:  furosemide  (LASIX ) 200 mg in dextrose  5 % 100 mL (2 mg/mL) infusion 20 mg/hr (02/13/24 1648)     LOS: 4 days    Time spent: lovenox     Sigurd Pac, MD [facility]   02/13/2024, 5:10 PM

## 2024-02-13 NOTE — Hospital Course (Addendum)
 Mrs. Lizardi was admitted to the hospital with the working diagnosis of heart failure exacerbation.   48 year old female with history of recent diagnosis of diabetes mellitus type 2 on insulin  presented with worsening dyspnea, orthopnea and lower extremity edema. Progressive and worsening symptoms, reported 40 lbs weight gain, difficulty ambulating due to dyspnea and peripheral edema.  On her initial physical examination her blood pressure was 156/95, HR 96, RR 13 and 02 saturation 92% Lungs with rales bilaterally with no wheezing, heart with S1 and S2 present and regular, with no gallops, no murmurs, positive JVD, abdomen with no distention, soft and non tender, positive lower extremity edema +++.   Na 130, K 4.4 Cl 94 bicarbonate 25 glucose 440 bun 25 cr 0,43  Mg 1,9  AST 38 ALT 43  BNP 1,135 High sensitive troponin 18 and 18  Wbc  13,5 hgb 16,2 plt 314  C diff negative   Chest radiograph with hypoinflation, positive cardiomegaly with bilateral hilar vascular congestion, bilateral pleural effusions more right than left.   EKG 98 bpm, normal axis, qtc 523, sinus rhythm with bilateral atrial enlargement with no significant ST segment or T wave changes. Poor RR wave progression and low voltage.   Patient was placed on furosemide  for diuresis Echocardiogram with reduced LV systolic function   09/23 transferred from [facility] to [facility] for further heart failure therapy and diagnostics.

## 2024-02-14 ENCOUNTER — Telehealth: Payer: Self-pay

## 2024-02-14 ENCOUNTER — Inpatient Hospital Stay (HOSPITAL_COMMUNITY): Payer: MEDICAID

## 2024-02-14 DIAGNOSIS — I5023 Acute on chronic systolic (congestive) heart failure: Secondary | ICD-10-CM

## 2024-02-14 LAB — GLUCOSE, CAPILLARY
Glucose-Capillary: 155 mg/dL — ABNORMAL HIGH (ref 70–99)
Glucose-Capillary: 220 mg/dL — ABNORMAL HIGH (ref 70–99)
Glucose-Capillary: 256 mg/dL — ABNORMAL HIGH (ref 70–99)
Glucose-Capillary: 190 mg/dL — ABNORMAL HIGH (ref 70–99)
Glucose-Capillary: 229 mg/dL — ABNORMAL HIGH (ref 70–99)

## 2024-02-14 LAB — COOXEMETRY PANEL
Carboxyhemoglobin: 1.2 % (ref 0.5–1.5)
Carboxyhemoglobin: 1.6 % — ABNORMAL HIGH (ref 0.5–1.5)
Methemoglobin: <0.7 % (ref 0.0–1.5)
Methemoglobin: <0.7 % (ref 0.0–1.5)
O2 Saturation: 61.2 %
O2 Saturation: 66.8 %
Total hemoglobin: 16 g/dL (ref 12.0–16.0)
Total hemoglobin: 16.4 g/dL — ABNORMAL HIGH (ref 12.0–16.0)

## 2024-02-14 LAB — BASIC METABOLIC PANEL WITH GFR
Anion gap: 11 (ref 5–15)
BUN: 28 mg/dL — ABNORMAL HIGH (ref 6–20)
CO2: 29 mmol/L (ref 22–32)
Calcium: 7.6 mg/dL — ABNORMAL LOW (ref 8.9–10.3)
Chloride: 95 mmol/L — ABNORMAL LOW (ref 98–111)
Creatinine, Ser: 0.65 mg/dL (ref 0.44–1.00)
GFR, Estimated: >60 mL/min (ref >60)
Glucose, Bld: 244 mg/dL — ABNORMAL HIGH (ref 70–99)
Potassium: 3.4 mmol/L — ABNORMAL LOW (ref 3.5–5.1)
Sodium: 135 mmol/L (ref 135–145)

## 2024-02-14 LAB — HEMOGLOBIN A1C
Hgb A1c MFr Bld: 12.6 % — ABNORMAL HIGH (ref 4.8–5.6)
Mean Plasma Glucose: 314.92 mg/dL

## 2024-02-14 LAB — MAGNESIUM: Magnesium: 2.3 mg/dL (ref 1.7–2.4)

## 2024-02-14 MED ORDER — INSULIN GLARGINE 100 UNIT/ML ~~LOC~~ SOLN
18.0000 [IU] | Freq: Every day | SUBCUTANEOUS | Status: DC
  Administered 2024-02-14 – 2024-02-15 (×2): 18 [IU] via SUBCUTANEOUS
  Filled 2024-02-14 (×4): qty 0.18

## 2024-02-14 MED ORDER — POTASSIUM CHLORIDE CRYS ER 20 MEQ PO TBCR
40.0000 meq | EXTENDED_RELEASE_TABLET | Freq: Once | ORAL | Status: AC
  Administered 2024-02-14: 40 meq via ORAL
  Filled 2024-02-14: qty 2

## 2024-02-14 NOTE — Telephone Encounter (Signed)
 Advanced Heart Failure Patient Advocate Encounter  Application for Jardiance  faxed to [facility] on 02/14/2024. Application form attached to patient chart.  Rachel DEL, CPhT Rx Patient Advocate Phone: [phone]

## 2024-02-14 NOTE — Telephone Encounter (Signed)
 Advanced Heart Failure Patient Advocate Encounter  Application for Entresto  faxed to [employer] on 02/14/2024. Application form attached to patient chart.  Rachel DEL, CPhT Rx Patient Advocate Phone: [phone]

## 2024-02-14 NOTE — Progress Notes (Signed)
 Orthopedic Tech Progress Note Patient Details:  Christie Anderson 25-Jan-1976 [id number]  Ortho Devices Type of Ortho Device: Radio broadcast assistant Ortho Device/Splint Location: Bilateral Ortho Device/Splint Interventions: Ordered, Application, Adjustment   Post Interventions Patient Tolerated: Well  Adine MARLA Blush 02/14/2024, 9:35 AM

## 2024-02-14 NOTE — Progress Notes (Signed)
 Advanced Heart Failure Rounding Note  Cardiologist: None  Chief Complaint: Acute HFrEF Subjective:    Diuresed 12.8L with lasix  gtt + diamox . Down 16lbs overnight. -35 lbs overall.   Co-ox 61%  Feels much better this morning. Denies CP/SOB. Tired from getting up and having to go to the bathroom all night. Feels a little dizzy.   SBP 90s- low 100s  Objective:   Weight Range: 65.4 kg Body mass index is 21.28 kg/m.   Vital Signs:   Temp:  [98 F (36.7 C)-98.2 F (36.8 C)] 98.1 F (36.7 C) (09/24 0800) Pulse Rate:  [74-87] 87 (09/24 0800) Resp:  [17-19] 18 (09/24 0800) BP: (94-132)/(67-87) 94/67 (09/24 0800) SpO2:  [90 %-99 %] 97 % (09/24 0800) Weight:  [65.4 kg] 65.4 kg (09/24 0355) Last BM Date : 02/13/24  Weight change: Filed Weights   02/12/24 0300 02/13/24 0412 02/14/24 0355  Weight: 76.5 kg 72.9 kg 65.4 kg    Intake/Output:   Intake/Output Summary (Last 24 hours) at 02/14/2024 0821 Last data filed at 02/14/2024 0757 Gross per 24 hour  Intake 1224 ml  Output 86499 ml  Net -12276 ml    Physical Exam   General:  weak appearing.  No respiratory difficulty Neck: JVD elevated.  Cor: Regular rate & rhythm. No murmurs. Lungs: clear Extremities: +2-3 BLE edema. PICC RUE Neuro: alert & oriented x 3. Affect pleasant.  Telemetry   NSR 80s (Personally reviewed)    Labs    CBC No results for input(s): WBC, NEUTROABS, HGB, HCT, MCV, PLT in the last 72 hours. Basic Metabolic Panel Recent Labs    90/76/74 0228 02/14/24 0500  NA 136 135  K 3.6 3.4*  CL 101 95*  CO2 28 29  GLUCOSE 56* 244*  BUN 18 28*  CREATININE 0.39* 0.65  CALCIUM 7.9* 7.6*  MG 1.8 2.3   Liver Function Tests No results for input(s): AST, ALT, ALKPHOS, BILITOT, PROT, ALBUMIN in the last 72 hours. No results for input(s): LIPASE, AMYLASE in the last 72 hours. Cardiac Enzymes No results for input(s): CKTOTAL, CKMB, CKMBINDEX, TROPONINI in the last  72 hours.  BNP: BNP (last 3 results) Recent Labs    02/09/24 1625  BNP 1,135.0*    ProBNP (last 3 results) No results for input(s): PROBNP in the last 8760 hours.   D-Dimer No results for input(s): DDIMER in the last 72 hours. Hemoglobin A1C No results for input(s): HGBA1C in the last 72 hours. Fasting Lipid Panel No results for input(s): CHOL, HDL, LDLCALC, TRIG, CHOLHDL, LDLDIRECT in the last 72 hours. Thyroid Function Tests No results for input(s): TSH, T4TOTAL, T3FREE, THYROIDAB in the last 72 hours.  Invalid input(s): FREET3  Other results:   Imaging    US  EKG SITE RITE Result Date: 02/13/2024 If Site Rite image not attached, placement could not be confirmed due to current cardiac rhythm.    Medications:     Scheduled Medications:  acetaZOLAMIDE   500 mg Oral BID   Chlorhexidine  Gluconate Cloth  6 each Topical Daily   empagliflozin   10 mg Oral Daily   enoxaparin  (LOVENOX ) injection  40 mg Subcutaneous QHS   insulin  aspart  0-20 Units Subcutaneous TID WC   insulin  aspart  0-5 Units Subcutaneous QHS   insulin  glargine  15 Units Subcutaneous QHS   potassium chloride   40 mEq Oral BID   Ensure Max Protein  11 oz Oral BID BM   sacubitril -valsartan   1 tablet Oral BID   sodium chloride   flush  10-40 mL Intracatheter Q12H   spironolactone   25 mg Oral Daily    Infusions:  furosemide  (LASIX ) 200 mg in dextrose  5 % 100 mL (2 mg/mL) infusion 20 mg/hr (02/14/24 0343)    PRN Medications: ondansetron  **OR** ondansetron  (ZOFRAN ) IV, polyethylene glycol, sodium chloride  flush    Patient Profile   MASAE LUKACS is a 48 y.o. female with history of tobacco use, she has not received any healthcare in > 10 years. H/O tobacco abuse.    A/C HFrEF possible infiltrative disease.   Assessment/Plan   1. Acute HFrEF - Echo LVEF 35-40% RV mildly reduced. Marked LVH with low volts on EKG.  - ? Infiltrative disease. BNP elevated. No previous  cardiac diagnosis. Mom died from HF.  - Check myeloma panel.  - EKG low voltage with poor anterior R wave progression consistent with underlying amyloid.  - Plan for cath with biopsy Friday.  - CMRI once diuresed.  - Remains volume overload. Remains severely orthopneic. CVP <5?. Continue lasix  gtt at 20 mg/hr. -12.8L UOP. No diamox  today.  - Co-ox 61%. Check co-ox this afternoon. If low will start milrinone.  - Continue spiro 25 mg daily  - Continue entresto  24-26 mg twice a day.  - Continue Jardiance , consider stopping with A1c >10 - Place UNNA boots - Avoid BB while volume overloaded   2. Uncontrolled DM  Hgb A1C 14.  On SSI  Consider stopping SGLT2i with A1c >10 Needs PCP follow up.   3. Pleural Effusion R>L  Continue to diurese with IV lasix . May need thoracentesis.  PCXR today   4. Tobacco Abuse Cessation advised  Length of Stay: 5  Beckey LITTIE Coe, NP  02/14/2024, 8:21 AM  Advanced Heart Failure Team Pager [phone] (M-F; 7a - 5p)  Please contact [facility] for night-coverage after hours (5p -7a ) and weekends on [email or web address]

## 2024-02-14 NOTE — H&P (View-Only) (Signed)
 Advanced Heart Failure Rounding Note  Cardiologist: None  Chief Complaint: Acute HFrEF Subjective:    Diuresed 12.8L with lasix  gtt + diamox . Down 16lbs overnight. -35 lbs overall.   Co-ox 61%  Feels much better this morning. Denies CP/SOB. Tired from getting up and having to go to the bathroom all night. Feels a little dizzy.   SBP 90s- low 100s  Objective:   Weight Range: 65.4 kg Body mass index is 21.28 kg/m.   Vital Signs:   Temp:  [98 F (36.7 C)-98.2 F (36.8 C)] 98.1 F (36.7 C) (09/24 0800) Pulse Rate:  [74-87] 87 (09/24 0800) Resp:  [17-19] 18 (09/24 0800) BP: (94-132)/(67-87) 94/67 (09/24 0800) SpO2:  [90 %-99 %] 97 % (09/24 0800) Weight:  [65.4 kg] 65.4 kg (09/24 0355) Last BM Date : 02/13/24  Weight change: Filed Weights   02/12/24 0300 02/13/24 0412 02/14/24 0355  Weight: 76.5 kg 72.9 kg 65.4 kg    Intake/Output:   Intake/Output Summary (Last 24 hours) at 02/14/2024 0821 Last data filed at 02/14/2024 0757 Gross per 24 hour  Intake 1224 ml  Output 86499 ml  Net -12276 ml    Physical Exam   General:  weak appearing.  No respiratory difficulty Neck: JVD elevated.  Cor: Regular rate & rhythm. No murmurs. Lungs: clear Extremities: +2-3 BLE edema. PICC RUE Neuro: alert & oriented x 3. Affect pleasant.  Telemetry   NSR 80s (Personally reviewed)    Labs    CBC No results for input(s): WBC, NEUTROABS, HGB, HCT, MCV, PLT in the last 72 hours. Basic Metabolic Panel Recent Labs    90/76/74 0228 02/14/24 0500  NA 136 135  K 3.6 3.4*  CL 101 95*  CO2 28 29  GLUCOSE 56* 244*  BUN 18 28*  CREATININE 0.39* 0.65  CALCIUM 7.9* 7.6*  MG 1.8 2.3   Liver Function Tests No results for input(s): AST, ALT, ALKPHOS, BILITOT, PROT, ALBUMIN in the last 72 hours. No results for input(s): LIPASE, AMYLASE in the last 72 hours. Cardiac Enzymes No results for input(s): CKTOTAL, CKMB, CKMBINDEX, TROPONINI in the last  72 hours.  BNP: BNP (last 3 results) Recent Labs    02/09/24 1625  BNP 1,135.0*    ProBNP (last 3 results) No results for input(s): PROBNP in the last 8760 hours.   D-Dimer No results for input(s): DDIMER in the last 72 hours. Hemoglobin A1C No results for input(s): HGBA1C in the last 72 hours. Fasting Lipid Panel No results for input(s): CHOL, HDL, LDLCALC, TRIG, CHOLHDL, LDLDIRECT in the last 72 hours. Thyroid Function Tests No results for input(s): TSH, T4TOTAL, T3FREE, THYROIDAB in the last 72 hours.  Invalid input(s): FREET3  Other results:   Imaging    US  EKG SITE RITE Result Date: 02/13/2024 If Site Rite image not attached, placement could not be confirmed due to current cardiac rhythm.    Medications:     Scheduled Medications:  acetaZOLAMIDE   500 mg Oral BID   Chlorhexidine  Gluconate Cloth  6 each Topical Daily   empagliflozin   10 mg Oral Daily   enoxaparin  (LOVENOX ) injection  40 mg Subcutaneous QHS   insulin  aspart  0-20 Units Subcutaneous TID WC   insulin  aspart  0-5 Units Subcutaneous QHS   insulin  glargine  15 Units Subcutaneous QHS   potassium chloride   40 mEq Oral BID   Ensure Max Protein  11 oz Oral BID BM   sacubitril -valsartan   1 tablet Oral BID   sodium chloride   flush  10-40 mL Intracatheter Q12H   spironolactone   25 mg Oral Daily    Infusions:  furosemide  (LASIX ) 200 mg in dextrose  5 % 100 mL (2 mg/mL) infusion 20 mg/hr (02/14/24 0343)    PRN Medications: ondansetron  **OR** ondansetron  (ZOFRAN ) IV, polyethylene glycol, sodium chloride  flush    Patient Profile   Christie Anderson is a 48 y.o. female with history of tobacco use, she has not received any healthcare in > 10 years. H/O tobacco abuse.    A/C HFrEF possible infiltrative disease.   Assessment/Plan   1. Acute HFrEF - Echo LVEF 35-40% RV mildly reduced. Marked LVH with low volts on EKG.  - ? Infiltrative disease. BNP elevated. No previous  cardiac diagnosis. Mom died from HF.  - Check myeloma panel.  - EKG low voltage with poor anterior R wave progression consistent with underlying amyloid.  - Plan for cath with biopsy Friday.  - CMRI once diuresed.  - Remains volume overload. Remains severely orthopneic. CVP <5?. Continue lasix  gtt at 20 mg/hr. -12.8L UOP. No diamox  today.  - Co-ox 61%. Check co-ox this afternoon. If low will start milrinone.  - Continue spiro 25 mg daily  - Continue entresto  24-26 mg twice a day.  - Continue Jardiance , consider stopping with A1c >10 - Place UNNA boots - Avoid BB while volume overloaded   2. Uncontrolled DM  Hgb A1C 14.  On SSI  Consider stopping SGLT2i with A1c >10 Needs PCP follow up.   3. Pleural Effusion R>L  Continue to diurese with IV lasix . May need thoracentesis.  PCXR today   4. Tobacco Abuse Cessation advised  Length of Stay: 5  Christie LITTIE Coe, NP  02/14/2024, 8:21 AM  Advanced Heart Failure Team Pager [phone] (M-F; 7a - 5p)  Please contact [facility] for night-coverage after hours (5p -7a ) and weekends on [email or web address]

## 2024-02-14 NOTE — Plan of Care (Signed)
  Problem: Clinical Measurements: Goal: Ability to maintain clinical measurements within normal limits will improve Outcome: Progressing Goal: Diagnostic test results will improve Outcome: Progressing   Problem: Nutrition: Goal: Adequate nutrition will be maintained Outcome: Progressing   Problem: Activity: Goal: Capacity to carry out activities will improve Outcome: Progressing   Problem: Cardiac: Goal: Ability to achieve and maintain adequate cardiopulmonary perfusion will improve Outcome: Progressing

## 2024-02-15 ENCOUNTER — Inpatient Hospital Stay (HOSPITAL_COMMUNITY): Payer: MEDICAID

## 2024-02-15 DIAGNOSIS — I5020 Unspecified systolic (congestive) heart failure: Secondary | ICD-10-CM

## 2024-02-15 LAB — MULTIPLE MYELOMA PANEL, SERUM
Albumin SerPl Elph-Mcnc: 2.5 g/dL — ABNORMAL LOW (ref 2.9–4.4)
Albumin/Glob SerPl: 0.7 (ref 0.7–1.7)
Alpha 1: 0.3 g/dL (ref 0.0–0.4)
Alpha2 Glob SerPl Elph-Mcnc: 1.1 g/dL — ABNORMAL HIGH (ref 0.4–1.0)
B-Globulin SerPl Elph-Mcnc: 1.3 g/dL (ref 0.7–1.3)
Gamma Glob SerPl Elph-Mcnc: 1.1 g/dL (ref 0.4–1.8)
Globulin, Total: 3.6 g/dL (ref 2.2–3.9)
IgA: 507 mg/dL — ABNORMAL HIGH (ref 87–352)
IgG (Immunoglobin G), Serum: 1124 mg/dL (ref 586–1602)
IgM (Immunoglobulin M), Srm: 126 mg/dL (ref 26–217)
Total Protein ELP: 6.1 g/dL (ref 6.0–8.5)

## 2024-02-15 LAB — BASIC METABOLIC PANEL WITH GFR
Anion gap: 7 (ref 5–15)
Anion gap: 10 (ref 5–15)
BUN: 35 mg/dL — ABNORMAL HIGH (ref 6–20)
BUN: 33 mg/dL — ABNORMAL HIGH (ref 6–20)
CO2: 29 mmol/L (ref 22–32)
CO2: 29 mmol/L (ref 22–32)
Calcium: 7.7 mg/dL — ABNORMAL LOW (ref 8.9–10.3)
Calcium: 8 mg/dL — ABNORMAL LOW (ref 8.9–10.3)
Chloride: 98 mmol/L (ref 98–111)
Chloride: 93 mmol/L — ABNORMAL LOW (ref 98–111)
Creatinine, Ser: 0.72 mg/dL (ref 0.44–1.00)
Creatinine, Ser: 0.82 mg/dL (ref 0.44–1.00)
GFR, Estimated: >60 mL/min (ref >60)
GFR, Estimated: >60 mL/min (ref >60)
Glucose, Bld: 138 mg/dL — ABNORMAL HIGH (ref 70–99)
Glucose, Bld: 320 mg/dL — ABNORMAL HIGH (ref 70–99)
Potassium: 3.3 mmol/L — ABNORMAL LOW (ref 3.5–5.1)
Potassium: 4 mmol/L (ref 3.5–5.1)
Sodium: 134 mmol/L — ABNORMAL LOW (ref 135–145)
Sodium: 132 mmol/L — ABNORMAL LOW (ref 135–145)

## 2024-02-15 LAB — MAGNESIUM: Magnesium: 2.3 mg/dL (ref 1.7–2.4)

## 2024-02-15 LAB — CBC
HCT: 42.4 % (ref 36.0–46.0)
Hemoglobin: 14.3 g/dL (ref 12.0–15.0)
MCH: 31 pg (ref 26.0–34.0)
MCHC: 33.7 g/dL (ref 30.0–36.0)
MCV: 92 fL (ref 80.0–100.0)
Platelets: 226 K/uL (ref 150–400)
RBC: 4.61 MIL/uL (ref 3.87–5.11)
RDW: 12.6 % (ref 11.5–15.5)
WBC: 10.6 K/uL — ABNORMAL HIGH (ref 4.0–10.5)
nRBC: 0 % (ref 0.0–0.2)

## 2024-02-15 LAB — GLUCOSE, CAPILLARY
Glucose-Capillary: 126 mg/dL — ABNORMAL HIGH (ref 70–99)
Glucose-Capillary: 176 mg/dL — ABNORMAL HIGH (ref 70–99)
Glucose-Capillary: 264 mg/dL — ABNORMAL HIGH (ref 70–99)
Glucose-Capillary: 370 mg/dL — ABNORMAL HIGH (ref 70–99)
Glucose-Capillary: 250 mg/dL — ABNORMAL HIGH (ref 70–99)

## 2024-02-15 LAB — KAPPA/LAMBDA LIGHT CHAINS
Kappa free light chain: 73.4 mg/L — ABNORMAL HIGH (ref 3.3–19.4)
Kappa, lambda light chain ratio: 1.85 — ABNORMAL HIGH (ref 0.26–1.65)
Lambda free light chains: 39.7 mg/L — ABNORMAL HIGH (ref 5.7–26.3)

## 2024-02-15 LAB — COOXEMETRY PANEL
Carboxyhemoglobin: 1.6 % — ABNORMAL HIGH (ref 0.5–1.5)
Methemoglobin: <0.7 % (ref 0.0–1.5)
O2 Saturation: 74.3 %
Total hemoglobin: 15.1 g/dL (ref 12.0–16.0)

## 2024-02-15 MED ORDER — ASPIRIN 81 MG PO CHEW
81.0000 mg | CHEWABLE_TABLET | ORAL | Status: AC
  Administered 2024-02-16: 81 mg via ORAL
  Filled 2024-02-15: qty 1

## 2024-02-15 MED ORDER — ASPIRIN 81 MG PO CHEW
81.0000 mg | CHEWABLE_TABLET | ORAL | Status: AC
  Filled 2024-02-15: qty 1

## 2024-02-15 MED ORDER — FREE WATER: 10.0000 mL | Freq: Once | Status: DC

## 2024-02-15 MED ORDER — POTASSIUM CHLORIDE 20 MEQ PO PACK
20.0000 meq | PACK | Freq: Once | ORAL | Status: AC
  Administered 2024-02-15: 20 meq via ORAL
  Filled 2024-02-15: qty 1

## 2024-02-15 MED ORDER — FREE WATER
250.0000 mL | Freq: Once | Status: AC
Start: 2024-02-16 — End: 2024-02-16
  Administered 2024-02-16: 250 mL via ORAL

## 2024-02-15 MED ORDER — LORAZEPAM 2 MG/ML IJ SOLN
1.0000 mg | Freq: Every day | INTRAMUSCULAR | Status: AC | PRN
  Administered 2024-02-15: 1 mg via INTRAVENOUS
  Filled 2024-02-15: qty 1

## 2024-02-15 MED ORDER — INSULIN ASPART 100 UNIT/ML IJ SOLN
0.0000 [IU] | Freq: Three times a day (TID) | INTRAMUSCULAR | Status: DC
  Administered 2024-02-15 – 2024-02-16 (×2): 8 [IU] via SUBCUTANEOUS

## 2024-02-15 MED ORDER — POTASSIUM CHLORIDE 20 MEQ PO PACK
60.0000 meq | PACK | ORAL | Status: AC
Start: 2024-02-15 — End: 2024-02-15
  Administered 2024-02-15 (×2): 60 meq via ORAL
  Filled 2024-02-15 (×2): qty 3

## 2024-02-15 MED ORDER — INSULIN STARTER KIT- PEN NEEDLES (ENGLISH)
1.0000 | Freq: Once | Status: DC
  Filled 2024-02-15: qty 1

## 2024-02-15 MED ORDER — GADOBUTROL 1 MMOL/ML IV SOLN
6.0000 mL | Freq: Once | INTRAVENOUS | Status: AC | PRN
Start: 2024-02-15 — End: 2024-02-15
  Administered 2024-02-15: 6 mL via INTRAVENOUS

## 2024-02-15 MED ORDER — ACETAZOLAMIDE 250 MG PO TABS
250.0000 mg | ORAL_TABLET | Freq: Once | ORAL | Status: AC
Start: 2024-02-15 — End: 2024-02-15
  Administered 2024-02-15: 250 mg via ORAL
  Filled 2024-02-15: qty 1

## 2024-02-15 NOTE — Plan of Care (Signed)
  Problem: Clinical Measurements: Goal: Will remain free from infection Outcome: Progressing Goal: Respiratory complications will improve Outcome: Progressing Goal: Cardiovascular complication will be avoided Outcome: Progressing   Problem: Elimination: Goal: Will not experience complications related to bowel motility Outcome: Progressing Goal: Will not experience complications related to urinary retention Outcome: Progressing   Problem: Pain Managment: Goal: General experience of comfort will improve and/or be controlled Outcome: Progressing   Problem: Safety: Goal: Ability to remain free from injury will improve Outcome: Progressing   Problem: Tissue Perfusion: Goal: Adequacy of tissue perfusion will improve Outcome: Progressing

## 2024-02-15 NOTE — Progress Notes (Signed)
 Advanced Heart Failure Rounding Note  Cardiologist: None  Chief Complaint: Acute HFrEF Subjective:    Diuresed 7.6L with lasix  gtt. Down another 13lbs overnight. -48 lbs overall.   Co-ox 74%  CVP still <5  SBP 100s-110s  Continues to feel better this morning. Able to lift her legs back into bed without having to pick them up with her hands this am. Able to lay flatter today.   Objective:   Weight Range: 59.5 kg Body mass index is 19.36 kg/m.   Vital Signs:   Temp:  [98 F (36.7 C)-98.4 F (36.9 C)] 98.4 F (36.9 C) (09/25 0009) Pulse Rate:  [77-89] 77 (09/25 0015) Resp:  [18-20] 18 (09/25 0009) BP: (88-123)/(65-82) 123/82 (09/25 0009) SpO2:  [95 %-98 %] 97 % (09/25 0015) Weight:  [59.5 kg] 59.5 kg (09/25 0500) Last BM Date : 02/14/24  Weight change: Filed Weights   02/13/24 0412 02/14/24 0355 02/15/24 0500  Weight: 72.9 kg 65.4 kg 59.5 kg    Intake/Output:   Intake/Output Summary (Last 24 hours) at 02/15/2024 0709 Last data filed at 02/15/2024 0021 Gross per 24 hour  Intake 1206 ml  Output 7650 ml  Net -6444 ml    Physical Exam   General:  well appearing.  No respiratory difficulty Neck: JVD ~11.  Cor: Regular rate & rhythm. No murmurs. Lungs: clear Extremities: +2-3 LLE edema to thigh. +1 RLE edema to thigh.  Neuro: alert & oriented x 3. Affect pleasant.  Telemetry   NSR 80s (Personally reviewed)    Labs    CBC No results for input(s): WBC, NEUTROABS, HGB, HCT, MCV, PLT in the last 72 hours. Basic Metabolic Panel Recent Labs    90/75/74 0500 02/15/24 0520  NA 135 134*  K 3.4* 3.3*  CL 95* 98  CO2 29 29  GLUCOSE 244* 138*  BUN 28* 35*  CREATININE 0.65 0.72  CALCIUM 7.6* 7.7*  MG 2.3 2.3   Liver Function Tests No results for input(s): AST, ALT, ALKPHOS, BILITOT, PROT, ALBUMIN in the last 72 hours. No results for input(s): LIPASE, AMYLASE in the last 72 hours. Cardiac Enzymes No results for input(s):  CKTOTAL, CKMB, CKMBINDEX, TROPONINI in the last 72 hours.  BNP: BNP (last 3 results) Recent Labs    02/09/24 1625  BNP 1,135.0*    ProBNP (last 3 results) No results for input(s): PROBNP in the last 8760 hours.   D-Dimer No results for input(s): DDIMER in the last 72 hours. Hemoglobin A1C Recent Labs    02/13/24 0228  HGBA1C 12.6*   Fasting Lipid Panel No results for input(s): CHOL, HDL, LDLCALC, TRIG, CHOLHDL, LDLDIRECT in the last 72 hours. Thyroid Function Tests No results for input(s): TSH, T4TOTAL, T3FREE, THYROIDAB in the last 72 hours.  Invalid input(s): FREET3  Other results:   Imaging    DG CHEST PORT 1 VIEW Result Date: 02/14/2024 CLINICAL DATA:  Hypoxia EXAM: PORTABLE CHEST 1 VIEW COMPARISON:  February 09, 2024 FINDINGS: Interval placement of right-sided PICC line with tip at the cavoatrial junction. Improved aeration of the lungs with likely decrease in size of small right pleural effusion and likely resolution of left pleural effusion. Persistent left basilar consolidation/atelectasis. Cardiomediastinal silhouette is within normal limits. No acute osseous findings. IMPRESSION: Interval placement of right upper extremity PICC line with tip at the cavoatrial junction. Interval decrease in size of small right pleural effusion and resolution of left pleural effusion. Improved aeration of the lung bases with persistent likely right lung base  atelectasis. Electronically Signed   By: Michaeline Blanch M.D.   On: 02/14/2024 15:01     Medications:     Scheduled Medications:  Chlorhexidine  Gluconate Cloth  6 each Topical Daily   empagliflozin   10 mg Oral Daily   enoxaparin  (LOVENOX ) injection  40 mg Subcutaneous QHS   insulin  aspart  0-20 Units Subcutaneous TID WC   insulin  aspart  0-5 Units Subcutaneous QHS   insulin  glargine  18 Units Subcutaneous QHS   potassium chloride   40 mEq Oral BID   Ensure Max Protein  11 oz Oral BID BM    sacubitril -valsartan   1 tablet Oral BID   sodium chloride  flush  10-40 mL Intracatheter Q12H   spironolactone   25 mg Oral Daily    Infusions:  furosemide  (LASIX ) 200 mg in dextrose  5 % 100 mL (2 mg/mL) infusion 20 mg/hr (02/14/24 2206)    PRN Medications: ondansetron  **OR** ondansetron  (ZOFRAN ) IV, polyethylene glycol, sodium chloride  flush    Patient Profile   Christie Anderson is a 48 y.o. female with history of tobacco use, she has not received any healthcare in > 10 years. H/O tobacco abuse.    A/C HFrEF possible infiltrative disease.   Assessment/Plan  1. Acute HFrEF - Echo LVEF 35-40% RV mildly reduced. Marked LVH with low volts on EKG.  - ? Infiltrative disease. BNP elevated. No previous cardiac diagnosis. Mom died from HF.  - Check myeloma panel.  - EKG low voltage with poor anterior R wave progression consistent with underlying amyloid.  - Plan for cath with biopsy Friday.  - CMRI once diuresed.  - Remains volume overload. Orthopnea better today. CVP <5?. Continue lasix  gtt at 20 mg/hr. Add diamox  250 x1 this morning.  - Co-ox 74%.  - Continue spiro 25 mg daily  - Continue entresto  24-26 mg twice a day.  - Continue Jardiance , consider stopping with A1c >10 - Continue UNNA boots - Avoid BB while volume overloaded   2. Uncontrolled DM  Hgb A1C 14.  On SSI  Consider stopping SGLT2i with A1c >10 Needs PCP follow up.   3. Pleural Effusion R>L  Continue to diurese with IV lasix .  PCXR 9/24 with resolved L pleural effusion and decrease in size of R pleural effusion   4. Tobacco Abuse Cessation advised  Volume improving but still not ideal for [facility] Plan to schedule that for tomorrow.   Length of Stay: 6  Beckey LITTIE Coe, NP  02/15/2024, 7:09 AM  Advanced Heart Failure Team Pager [phone] (M-F; 7a - 5p)  Please contact [facility] for night-coverage after hours (5p -7a ) and weekends on [email or web address]

## 2024-02-15 NOTE — Plan of Care (Signed)
  Problem: Health Behavior/Discharge Planning: Goal: Ability to manage health-related needs will improve Outcome: Progressing   Problem: Clinical Measurements: Goal: Ability to maintain clinical measurements within normal limits will improve Outcome: Progressing   Problem: Nutrition: Goal: Adequate nutrition will be maintained Outcome: Progressing   Problem: Activity: Goal: Capacity to carry out activities will improve Outcome: Progressing   Problem: Coping: Goal: Ability to adjust to condition or change in health will improve Outcome: Progressing   Problem: Metabolic: Goal: Ability to maintain appropriate glucose levels will improve Outcome: Progressing   Problem: Nutritional: Goal: Maintenance of adequate nutrition will improve Outcome: Progressing

## 2024-02-15 NOTE — Progress Notes (Addendum)
 PROGRESS NOTE    Christie Anderson  FMW:[id number] DOB: 1975/10/21 DOA: 02/09/2024 PCP: Grooms, Courtney, PA-C   48/F w recent diagnosis of DM2 on insulin  presented with worsening dyspnea, orthopnea and lower extremity edema. Progressive and worsening symptoms, reported 40 lbs weight gain, difficulty ambulating due to dyspnea and peripheral edema.  Labs- cr 0,43 , BNP 1,135, troponin 18 and 18  CXR w positive cardiomegaly with bilateral hilar vascular congestion, bilateral pleural effusions more right than left.  -placed on furosemide  for diuresis Echocardiogram with reduced LV systolic function  -9/23 transferred from [facility] to [facility] for further heart failure therapy and diagnostics.    Subjective: Feels better overall, breathing continues to improve, swelling down  Assessment and Plan:  Acute systolic CHF, new diagnosis -Echo with EF 35 to 40%, moderate LVH, grade I DD, mildly reduced RV  - Still appears volume overloaded, improving with diuresis, 18 L negative -Heart failure team following, currently on Lasix  gtt. -Continue Entresto , Aldactone , - Stop Jardiance  - CHF team following, plan for [facility] with biopsy tomorrow  Uncontrolled type 2 diabetes mellitus with hyperglycemia - A1c was 14 in July, repeat 12.6 - Diagnosed 7 years ago, not on any treatment - Poor candidate for SGLT2i, increase long-acting insulin , diabetes education, insulin  teaching  Essential hypertension -as above  Hypokalemia -replete.   Protein-calorie malnutrition, severe Continue with nutritional supplements.   Hypoalbuminemia  DVT prophylaxis: lovenox  Code Status: Full code Family Communication: None present Disposition Plan:   Consultants:    Procedures:   Antimicrobials:    Objective: Vitals:   02/15/24 0009 02/15/24 0015 02/15/24 0500 02/15/24 0800  BP: 123/82   91/63  Pulse: 80 77  81  Resp: 18   16  Temp: 98.4 F (36.9 C)   97.9 F (36.6 C)  TempSrc: Oral   Oral  SpO2: 96% 97%  98%   Weight:   59.5 kg   Height:        Intake/Output Summary (Last 24 hours) at 02/15/2024 1138 Last data filed at 02/15/2024 1006 Gross per 24 hour  Intake 966 ml  Output 4800 ml  Net -3834 ml   Filed Weights   02/13/24 0412 02/14/24 0355 02/15/24 0500  Weight: 72.9 kg 65.4 kg 59.5 kg    Examination:  Gen: Awake, Alert, Oriented X 3, chronically ill-appearing HEENT: Positive JVD Lungs: Good air movement bilaterally, CTAB CVS: S1S2/RRR Abd: soft, Non tender, non distended, BS present Extremities: 1+ edema, Unna boots on Skin: no new rashes on exposed skin    Data Reviewed:   CBC: Recent Labs  Lab 02/09/24 1625 02/10/24 0533  WBC 13.4* 10.0  HGB 16.2* 14.7  HCT 46.6* 43.1  MCV 90.3 90.5  PLT 314 257   Basic Metabolic Panel: Recent Labs  Lab 02/11/24 0837 02/12/24 0528 02/13/24 0228 02/14/24 0500 02/15/24 0520  NA 138 138 136 135 134*  K 3.7 3.8 3.6 3.4* 3.3*  CL 100 103 101 95* 98  CO2 29 31 28 29 29   GLUCOSE 71 111* 56* 244* 138*  BUN 19 17 18  28* 35*  CREATININE 0.36* 0.36* 0.39* 0.65 0.72  CALCIUM 8.0* 7.7* 7.9* 7.6* 7.7*  MG 1.9 1.9 1.8 2.3 2.3   GFR: Estimated Creatinine Clearance: 79 mL/min (by C-G formula based on SCr of 0.72 mg/dL). Liver Function Tests: Recent Labs  Lab 02/09/24 1625  AST 38  ALT 43  ALKPHOS 176*  BILITOT 0.4  PROT 6.2*  ALBUMIN 2.4*   No results for input(s): LIPASE,  AMYLASE in the last 168 hours. No results for input(s): AMMONIA in the last 168 hours. Coagulation Profile: No results for input(s): INR, PROTIME in the last 168 hours. Cardiac Enzymes: No results for input(s): CKTOTAL, CKMB, CKMBINDEX, TROPONINI in the last 168 hours. BNP (last 3 results) No results for input(s): PROBNP in the last 8760 hours. HbA1C: Recent Labs    02/13/24 0228  HGBA1C 12.6*   CBG: Recent Labs  Lab 02/14/24 0620 02/14/24 1328 02/14/24 1558 02/14/24 2135 02/15/24 0555  GLUCAP 220* 256* 190* 229* 126*    Lipid Profile: No results for input(s): CHOL, HDL, LDLCALC, TRIG, CHOLHDL, LDLDIRECT in the last 72 hours. Thyroid Function Tests: No results for input(s): TSH, T4TOTAL, FREET4, T3FREE, THYROIDAB in the last 72 hours. Anemia Panel: No results for input(s): VITAMINB12, FOLATE, FERRITIN, TIBC, IRON, RETICCTPCT in the last 72 hours. Urine analysis:    Component Value Date/Time   COLORURINE COLORLESS (A) 02/13/2024 2023   APPEARANCEUR CLEAR 02/13/2024 2023   LABSPEC 1.003 (L) 02/13/2024 2023   PHURINE 7.0 02/13/2024 2023   GLUCOSEU >=500 (A) 02/13/2024 2023   HGBUR NEGATIVE 02/13/2024 2023   BILIRUBINUR NEGATIVE 02/13/2024 2023   KETONESUR NEGATIVE 02/13/2024 2023   PROTEINUR 30 (A) 02/13/2024 2023   NITRITE NEGATIVE 02/13/2024 2023   LEUKOCYTESUR NEGATIVE 02/13/2024 2023   Sepsis Labs: @LABRCNTIP (procalcitonin:4,lacticidven:4)  ) Recent Results (from the past 240 hours)  C Difficile Quick Screen w PCR reflex     Status: None   Collection Time: 02/11/24 10:26 AM   Specimen: STOOL  Result Value Ref Range Status   C Diff antigen NEGATIVE NEGATIVE Final   C Diff toxin NEGATIVE NEGATIVE Final   C Diff interpretation No C. difficile detected.  Final    Comment: Performed at [facility], [address], [place], [place] [address]  Gastrointestinal Panel by PCR , Stool     Status: None   Collection Time: 02/11/24 10:26 AM   Specimen: STOOL  Result Value Ref Range Status   Campylobacter species NOT DETECTED NOT DETECTED Final   Plesimonas shigelloides NOT DETECTED NOT DETECTED Final   Salmonella species NOT DETECTED NOT DETECTED Final   Yersinia enterocolitica NOT DETECTED NOT DETECTED Final   Vibrio species NOT DETECTED NOT DETECTED Final   Vibrio cholerae NOT DETECTED NOT DETECTED Final   Enteroaggregative E coli (EAEC) NOT DETECTED NOT DETECTED Final   Enteropathogenic E coli (EPEC) NOT DETECTED NOT DETECTED Final   Enterotoxigenic E coli  (ETEC) NOT DETECTED NOT DETECTED Final   Shiga like toxin producing E coli (STEC) NOT DETECTED NOT DETECTED Final   Shigella/Enteroinvasive E coli (EIEC) NOT DETECTED NOT DETECTED Final   Cryptosporidium NOT DETECTED NOT DETECTED Final   Cyclospora cayetanensis NOT DETECTED NOT DETECTED Final   Entamoeba histolytica NOT DETECTED NOT DETECTED Final   Giardia lamblia NOT DETECTED NOT DETECTED Final   Adenovirus F40/41 NOT DETECTED NOT DETECTED Final   Astrovirus NOT DETECTED NOT DETECTED Final   Norovirus GI/GII NOT DETECTED NOT DETECTED Final   Rotavirus A NOT DETECTED NOT DETECTED Final   Sapovirus (I, II, IV, and V) NOT DETECTED NOT DETECTED Final    Comment: Performed at [facility], [address], [place], [place] [address]     Radiology Studies: DG CHEST PORT 1 VIEW Result Date: 02/14/2024 CLINICAL DATA:  Hypoxia EXAM: PORTABLE CHEST 1 VIEW COMPARISON:  February 09, 2024 FINDINGS: Interval placement of right-sided PICC line with tip at the cavoatrial junction. Improved aeration of the lungs  with likely decrease in size of small right pleural effusion and likely resolution of left pleural effusion. Persistent left basilar consolidation/atelectasis. Cardiomediastinal silhouette is within normal limits. No acute osseous findings. IMPRESSION: Interval placement of right upper extremity PICC line with tip at the cavoatrial junction. Interval decrease in size of small right pleural effusion and resolution of left pleural effusion. Improved aeration of the lung bases with persistent likely right lung base atelectasis. Electronically Signed   By: Michaeline Blanch M.D.   On: 02/14/2024 15:01     Scheduled Meds:  Chlorhexidine  Gluconate Cloth  6 each Topical Daily   empagliflozin   10 mg Oral Daily   enoxaparin  (LOVENOX ) injection  40 mg Subcutaneous QHS   free water   10 mL Oral Once   insulin  aspart  0-20 Units Subcutaneous TID WC   insulin  aspart  0-5 Units Subcutaneous QHS   insulin   glargine  18 Units Subcutaneous QHS   insulin  starter kit- pen needles  1 kit Other Once   potassium chloride   60 mEq Oral Q3H   Ensure Max Protein  11 oz Oral BID BM   sacubitril -valsartan   1 tablet Oral BID   sodium chloride  flush  10-40 mL Intracatheter Q12H   spironolactone   25 mg Oral Daily   Continuous Infusions:  furosemide  (LASIX ) 200 mg in dextrose  5 % 100 mL (2 mg/mL) infusion 20 mg/hr (02/14/24 2206)     LOS: 6 days    Time spent: lovenox     Sigurd Pac, MD [facility]   02/15/2024, 11:38 AM

## 2024-02-15 NOTE — Inpatient Diabetes Management (Signed)
 Inpatient Diabetes Program Recommendations  AACE/ADA: New Consensus Statement on Inpatient Glycemic Control (2015)  Target Ranges:  Prepandial:   less than 140 mg/dL      Peak postprandial:   less than 180 mg/dL (1-2 hours)      Critically ill patients:  140 - 180 mg/dL   Lab Results  Component Value Date   GLUCAP 126 (H) 02/15/2024   HGBA1C 12.6 (H) 02/13/2024    Review of Glycemic Control  Latest Reference Range & Units 02/14/24 15:58 02/14/24 21:35 02/15/24 05:55  Glucose-Capillary 70 - 99 mg/dL 809 (H) 770 (H) 873 (H)   Diabetes history: DM 2 Outpatient Diabetes medications:  Lantus  15 units daily Humalog  5 units tid with meals  Current orders for Inpatient glycemic control:  Novolog  0-20 units tid with meals and HS Jardiance  10 mg daily Lantus  18 units q HS Inpatient Diabetes Program Recommendations:    May consider reducing Novolog  correction to moderate (0-15 units) tid with meals.  Patient was given Lily/Basaglar  coupon which makes it $35/prescription. This would also make Humalog  be $35 per prescription.   Thanks,  Randall Bullocks, RN, BC-ADM Inpatient Diabetes Coordinator Pager [phone]  (8a-5p)

## 2024-02-16 ENCOUNTER — Encounter (HOSPITAL_COMMUNITY)
Admission: EM | Disposition: A | Discharge status: Home / Self Care | Payer: Self-pay | Source: Home / Self Care | Attending: Internal Medicine

## 2024-02-16 ENCOUNTER — Other Ambulatory Visit (HOSPITAL_COMMUNITY): Payer: Self-pay

## 2024-02-16 ENCOUNTER — Inpatient Hospital Stay (HOSPITAL_COMMUNITY): Payer: MEDICAID

## 2024-02-16 DIAGNOSIS — I5021 Acute systolic (congestive) heart failure: Secondary | ICD-10-CM

## 2024-02-16 HISTORY — PX: ENDOMYOCARDIAL BIOPSY: CATH118375

## 2024-02-16 HISTORY — PX: RIGHT HEART CATH: CATH118263

## 2024-02-16 LAB — BASIC METABOLIC PANEL WITH GFR
Anion gap: 10 (ref 5–15)
BUN: 35 mg/dL — ABNORMAL HIGH (ref 6–20)
CO2: 28 mmol/L (ref 22–32)
Calcium: 7.9 mg/dL — ABNORMAL LOW (ref 8.9–10.3)
Chloride: 95 mmol/L — ABNORMAL LOW (ref 98–111)
Creatinine, Ser: 0.79 mg/dL (ref 0.44–1.00)
GFR, Estimated: >60 mL/min (ref >60)
Glucose, Bld: 160 mg/dL — ABNORMAL HIGH (ref 70–99)
Potassium: 3.5 mmol/L (ref 3.5–5.1)
Sodium: 133 mmol/L — ABNORMAL LOW (ref 135–145)

## 2024-02-16 LAB — GLUCOSE, CAPILLARY
Glucose-Capillary: 165 mg/dL — ABNORMAL HIGH (ref 70–99)
Glucose-Capillary: 256 mg/dL — ABNORMAL HIGH (ref 70–99)

## 2024-02-16 LAB — POCT I-STAT EG7
Acid-Base Excess: 4 mmol/L — ABNORMAL HIGH (ref 0.0–2.0)
Acid-Base Excess: 5 mmol/L — ABNORMAL HIGH (ref 0.0–2.0)
Bicarbonate: 30 mmol/L — ABNORMAL HIGH (ref 20.0–28.0)
Bicarbonate: 31.7 mmol/L — ABNORMAL HIGH (ref 20.0–28.0)
Calcium, Ion: 1.1 mmol/L — ABNORMAL LOW (ref 1.15–1.40)
Calcium, Ion: 1.12 mmol/L — ABNORMAL LOW (ref 1.15–1.40)
HCT: 40 % (ref 36.0–46.0)
HCT: 41 % (ref 36.0–46.0)
Hemoglobin: 13.6 g/dL (ref 12.0–15.0)
Hemoglobin: 13.9 g/dL (ref 12.0–15.0)
O2 Saturation: 68 %
O2 Saturation: 71 %
Potassium: 3 mmol/L — ABNORMAL LOW (ref 3.5–5.1)
Potassium: 3.1 mmol/L — ABNORMAL LOW (ref 3.5–5.1)
Sodium: 136 mmol/L (ref 135–145)
Sodium: 136 mmol/L (ref 135–145)
TCO2: 32 mmol/L (ref 22–32)
TCO2: 33 mmol/L — ABNORMAL HIGH (ref 22–32)
pCO2, Ven: 51.4 mmHg (ref 44–60)
pCO2, Ven: 54.2 mmHg (ref 44–60)
pH, Ven: 7.374 (ref 7.25–7.43)
pH, Ven: 7.376 (ref 7.25–7.43)
pO2, Ven: 37 mmHg (ref 32–45)
pO2, Ven: 39 mmHg (ref 32–45)

## 2024-02-16 LAB — COOXEMETRY PANEL
Carboxyhemoglobin: 1.4 % (ref 0.5–1.5)
Methemoglobin: <0.7 % (ref 0.0–1.5)
O2 Saturation: 87.8 %
Total hemoglobin: 14.8 g/dL (ref 12.0–16.0)

## 2024-02-16 LAB — ECHOCARDIOGRAM LIMITED
Height: 69 in
Weight: 1918.88 [oz_av]

## 2024-02-16 LAB — MAGNESIUM: Magnesium: 2.4 mg/dL (ref 1.7–2.4)

## 2024-02-16 SURGERY — RIGHT HEART CATH
Anesthesia: LOCAL

## 2024-02-16 MED ORDER — MIDAZOLAM HCL 2 MG/2ML IJ SOLN
INTRAMUSCULAR | Status: DC | PRN
  Administered 2024-02-16: 1 mg via INTRAVENOUS

## 2024-02-16 MED ORDER — LANCET DEVICE MISC
1.0000 | Freq: Three times a day (TID) | 0 refills | Status: AC
  Filled 2024-02-16: qty 1, fill #0

## 2024-02-16 MED ORDER — POTASSIUM CHLORIDE CRYS ER 20 MEQ PO TBCR
60.0000 meq | EXTENDED_RELEASE_TABLET | Freq: Once | ORAL | Status: AC
  Administered 2024-02-16: 60 meq via ORAL
  Filled 2024-02-16: qty 3

## 2024-02-16 MED ORDER — FENTANYL CITRATE (PF) 100 MCG/2ML IJ SOLN
INTRAMUSCULAR | Status: DC | PRN
  Administered 2024-02-16: 25 ug via INTRAVENOUS

## 2024-02-16 MED ORDER — FENTANYL CITRATE (PF) 100 MCG/2ML IJ SOLN
INTRAMUSCULAR | Status: AC
  Filled 2024-02-16: qty 2

## 2024-02-16 MED ORDER — FUROSEMIDE 40 MG PO TABS
40.0000 mg | ORAL_TABLET | Freq: Every day | ORAL | 0 refills | Status: DC
  Filled 2024-02-16: qty 30, 30d supply, fill #0

## 2024-02-16 MED ORDER — FUROSEMIDE 40 MG PO TABS: 40.0000 mg | ORAL_TABLET | Freq: Every day | ORAL | Status: DC

## 2024-02-16 MED ORDER — SACUBITRIL-VALSARTAN 24-26 MG PO TABS
1.0000 | ORAL_TABLET | Freq: Two times a day (BID) | ORAL | 0 refills | Status: DC
  Filled 2024-02-16: qty 60, 30d supply, fill #0

## 2024-02-16 MED ORDER — INSULIN PEN NEEDLE 32G X 4 MM MISC
1.0000 | Freq: Three times a day (TID) | 0 refills | Status: AC
  Filled 2024-02-16: qty 100, 30d supply, fill #0

## 2024-02-16 MED ORDER — POTASSIUM CHLORIDE CRYS ER 20 MEQ PO TBCR: 40.0000 meq | EXTENDED_RELEASE_TABLET | Freq: Once | ORAL | Status: DC

## 2024-02-16 MED ORDER — LIDOCAINE HCL (PF) 1 % IJ SOLN
INTRAMUSCULAR | Status: DC | PRN
  Administered 2024-02-16: 5 mL via INTRADERMAL

## 2024-02-16 MED ORDER — INSULIN GLARGINE 100 UNIT/ML ~~LOC~~ SOLN
25.0000 [IU] | Freq: Every day | SUBCUTANEOUS | Status: DC
  Filled 2024-02-16 (×2): qty 0.25

## 2024-02-16 MED ORDER — BLOOD GLUCOSE TEST VI STRP
1.0000 | ORAL_STRIP | Freq: Three times a day (TID) | 0 refills | Status: AC
  Filled 2024-02-16: qty 100, 30d supply, fill #0

## 2024-02-16 MED ORDER — MIDAZOLAM HCL 2 MG/2ML IJ SOLN
INTRAMUSCULAR | Status: AC
  Filled 2024-02-16: qty 2

## 2024-02-16 MED ORDER — ACCU-CHEK SOFTCLIX LANCETS MISC
1.0000 | Freq: Three times a day (TID) | 0 refills | Status: AC
  Filled 2024-02-16: qty 100, 30d supply, fill #0

## 2024-02-16 MED ORDER — INSULIN ASPART 100 UNIT/ML IJ SOLN
3.0000 [IU] | Freq: Three times a day (TID) | INTRAMUSCULAR | Status: DC
  Administered 2024-02-16: 3 [IU] via SUBCUTANEOUS

## 2024-02-16 MED ORDER — SACUBITRIL-VALSARTAN 24-26 MG PO TABS
1.0000 | ORAL_TABLET | Freq: Two times a day (BID) | ORAL | Status: DC
Start: 2024-02-16 — End: 2024-02-16

## 2024-02-16 MED ORDER — SPIRONOLACTONE 25 MG PO TABS
25.0000 mg | ORAL_TABLET | Freq: Every day | ORAL | 0 refills | Status: DC
  Filled 2024-02-16: qty 30, 30d supply, fill #0

## 2024-02-16 MED ORDER — LIDOCAINE HCL (PF) 1 % IJ SOLN
INTRAMUSCULAR | Status: AC
  Filled 2024-02-16: qty 30

## 2024-02-16 MED ORDER — BLOOD GLUCOSE MONITOR SYSTEM W/DEVICE KIT
1.0000 | PACK | Freq: Three times a day (TID) | 0 refills | Status: AC
  Filled 2024-02-16: qty 1, 30d supply, fill #0

## 2024-02-16 MED ORDER — BASAGLAR KWIKPEN 100 UNIT/ML ~~LOC~~ SOPN
25.0000 [IU] | PEN_INJECTOR | Freq: Every evening | SUBCUTANEOUS | 1 refills | Status: DC
  Filled 2024-02-16: qty 6, 24d supply, fill #0

## 2024-02-16 SURGICAL SUPPLY — 6 items
CATH SWAN GANZ 7F STRAIGHT (CATHETERS) IMPLANT
FORCEPS JAWZ 7FR 2.2 PCRVD 50 (FORCEP) IMPLANT
KIT MICROPUNCTURE NIT STIFF (SHEATH) IMPLANT
PACK CARDIAC CATHETERIZATION (CUSTOM PROCEDURE TRAY) ×1 IMPLANT
SHEATH PINNACLE 9F 10CM (SHEATH) IMPLANT
SHEATH PROBE COVER 6X72 (BAG) IMPLANT

## 2024-02-16 NOTE — Telephone Encounter (Signed)
 Patient was approved to receive Jardiance  from [facility] Effective 02/14/2024 to 02/13/2025  Patient informed by phone

## 2024-02-16 NOTE — TOC Progression Note (Addendum)
 Transition of Care [facility]) - Progression Note    Patient Details  Name: Christie Anderson MRN: [id number] Date of Birth: 11-16-1975  [facility] CM/SW Contact  Waddell Barnie Rama, RN Phone Number: 02/16/2024, 11:39 AM  Clinical Narrative:    NCM assisted with Match Card for meds.  Per HF NCM previous note, will need Match/ HF funds.  This NCM also notified the HF pharmacist for HF funds.   Expected Discharge Plan: Home/Self Care Barriers to Discharge: Continued Medical Work up               Expected Discharge Plan and Services   Discharge Planning Services: CM Consult   Living arrangements for the past 2 months: Single Family Home Expected Discharge Date: 02/16/24                                     Social Drivers of Health (SDOH) Interventions SDOH Screenings   Food Insecurity: No Food Insecurity (02/10/2024)  Housing: Low Risk  (02/10/2024)  Transportation Needs: No Transportation Needs (02/10/2024)  Utilities: Not At Risk (02/10/2024)  Depression (PHQ2-9): High Risk (11/14/2023)  Tobacco Use: High Risk (02/12/2024)    Readmission Risk Interventions     No data to display

## 2024-02-16 NOTE — Progress Notes (Signed)
  MATCH MEDICATION ASSISTANCE CARD Pharmacies please call [phone] for claim processing assistance.  Rx BIN: [id number] Rx Group: [id number] Rx PCN: [id number] Relationship Code: 1 Person Code: [id number]  Patient ID (MRN): [id number]    Patient Name: Christie Anderson   Patient DOB: May 11, 1976   Discharge Date:02/16/24  Expiration Date:02/24/24 (must be filled within 7 days of discharge)

## 2024-02-16 NOTE — Progress Notes (Signed)
 Reviewed AVS, patient expressed understanding of medications, MD follow up reviewed.  See LDA for information on wounds at discharge. CCMD contacted and informed patients is being discharged.  Patient states all belongings brought to the hospital at time of admission are accounted for and packed to take home.  Instructed to Pick up medications from [facility] pharmacy. Education given on how to use insulin  needles, Picc line still in place.  Will need to be removed prior to discharge.  Primary nurse notified of updates.

## 2024-02-16 NOTE — Progress Notes (Addendum)
 Advanced Heart Failure Rounding Note  Cardiologist: None  Chief Complaint: Acute HFrEF Subjective:    Only 3.8L documented UOP with lasix  gtt and diamox , suspect inaccurate. Down another 12lbs overnight. -60 lbs overall.   Co-ox 88%. CVP still <5. Renal function WNL  SBP 90s-110s.   NPO for RHC with biopsy +/- LHC this morning.   CMRI yesterday with LVEF 32%, nonspecific pattern of delayed enhancement with midwall septal scar, seen in NiCM. No definite findings of cardiac amyloidosis. RVEF 39%. Mod pleural effusion.   Continues to feel better. Got rest last night. Denies CP/SOB.   Objective:   Weight Range: 54.4 kg Body mass index is 17.71 kg/m.   Vital Signs:   Temp:  [97.8 F (36.6 C)-98.8 F (37.1 C)] 98.2 F (36.8 C) (09/26 0500) Pulse Rate:  [81-98] 90 (09/26 0500) Resp:  [16-18] 18 (09/26 0500) BP: (86-111)/(62-75) 86/62 (09/26 0500) SpO2:  [96 %-98 %] 97 % (09/26 0500) Weight:  [54.4 kg] 54.4 kg (09/26 0523) Last BM Date : 02/15/24  Weight change: Filed Weights   02/14/24 0355 02/15/24 0500 02/16/24 0523  Weight: 65.4 kg 59.5 kg 54.4 kg    Intake/Output:   Intake/Output Summary (Last 24 hours) at 02/16/2024 0711 Last data filed at 02/16/2024 0500 Gross per 24 hour  Intake 737 ml  Output 3800 ml  Net -3063 ml    Physical Exam  General:  well appearing.  No respiratory difficulty Neck: JVD 9.  Cor: Regular rate & rhythm. No murmurs. Lungs: clear Extremities: +2 LLE edema to thigh. +1 RLE edema to thigh. + UNNA boots. PICC  Neuro: alert & oriented x 3. Affect pleasant.  Telemetry   NSR 70s (Personally reviewed)    Labs    CBC Recent Labs    02/15/24 1729  WBC 10.6*  HGB 14.3  HCT 42.4  MCV 92.0  PLT 226   Basic Metabolic Panel Recent Labs    90/74/74 0520 02/15/24 1735 02/16/24 0550  NA 134* 132* 133*  K 3.3* 4.0 3.5  CL 98 93* 95*  CO2 29 29 28   GLUCOSE 138* 320* 160*  BUN 35* 33* 35*  CREATININE 0.72 0.82 0.79  CALCIUM  7.7* 8.0* 7.9*  MG 2.3  --  2.4   Liver Function Tests No results for input(s): AST, ALT, ALKPHOS, BILITOT, PROT, ALBUMIN in the last 72 hours. No results for input(s): LIPASE, AMYLASE in the last 72 hours. Cardiac Enzymes No results for input(s): CKTOTAL, CKMB, CKMBINDEX, TROPONINI in the last 72 hours.  BNP: BNP (last 3 results) Recent Labs    02/09/24 1625  BNP 1,135.0*    ProBNP (last 3 results) No results for input(s): PROBNP in the last 8760 hours.   D-Dimer No results for input(s): DDIMER in the last 72 hours. Hemoglobin A1C No results for input(s): HGBA1C in the last 72 hours.  Fasting Lipid Panel No results for input(s): CHOL, HDL, LDLCALC, TRIG, CHOLHDL, LDLDIRECT in the last 72 hours. Thyroid Function Tests No results for input(s): TSH, T4TOTAL, T3FREE, THYROIDAB in the last 72 hours.  Invalid input(s): FREET3  Other results:   Imaging    MR CARDIAC MORPHOLOGY W WO CONTRAST Result Date: 02/15/2024 CLINICAL DATA:  Heart failure, systolic, determine etiology EXAM: MR CARDIA MORPHOLOGY WITHOUT AND WITH CONTRAST; MR CARDIAC VELOCITY FLOW MAPPING TECHNIQUE: The patient was scanned on a 1.5 Tesla Siemens magnet. A dedicated cardiac coil was used. Functional imaging was done using TrueFisp sequences. 2,3, and 4 chamber views  were done to assess for RWMA's. Modified Simpson's rule using a short axis stack was used to calculate an ejection fraction on a dedicated work [employer]. The patient received 6mL GADAVIST  GADOBUTROL  1 MMOL/ML IV SOLN. After 10 minutes inversion recovery sequences were used to assess for infiltration and scar tissue. Phase contrast velocity encoded images obtained x 2. This examination is tailored for evaluation cardiac anatomy and function and provides very limited assessment of noncardiac structures, which are accordingly not evaluated during interpretation. If there is clinical  concern for extracardiac pathology, further evaluation with CT imaging should be considered. FINDINGS: LEFT VENTRICLE: Left ventricular chamber size: Mildly dilated. Left ventricular wall thickness: Moderately increased. Maximal wall thickness 14 mm Left ventricular systolic function: Moderately reduced LVEF = 32% Global hypokinesis. No myocardial edema, T2 = 55 msec Normal first pass perfusion. There is post contrast delayed myocardial enhancement: Midmyocardial stripe of delayed enhancement in the basal septum, nonspecific finding. Normal T1 myocardial nulling kinetics suggest against a diagnosis of cardiac amyloidosis. ECV = 32% RIGHT VENTRICLE: Normal right ventricular chamber size. Normal right ventricular wall thickness. Mildly reduced right ventricular systolic function. RVEF = 39% There are no regional wall motion abnormalities. Global hypokinesis. No post contrast delayed myocardial enhancement. ATRIA: Left atrium: Mildly dilated Right atrium: normal PERICARDIUM: Normal pericardium.  Small pericardial effusion. OTHER: No significant extracardiac findings. MEASUREMENTS: Qp/Qs: Not reported due to image quality. VALVES: Tricuspid aortic valve. Aortic valve regurgitation: Mild, regurgitant fraction 5% Pulmonary valve regurgitation: Trace, regurgitant fraction <1% Mitral valve regurgitation: Trace, regurgitant fraction <1% Tricuspid valve regurgitation: Poor quality phase contrast images, not able to be assessed. Left ventricle: LV female LV EF: 32 % (Normal 52-79%) Absolute volumes: LV EDV: (Normal 78-167 mL) LV ESV: (Normal 21-64 mL) LV SV: 64mL (Normal 52-114 mL) CO: 4.2L/min (Normal 2.7-6.3 L/min) Indexed volumes: CI: 2.5L/min/sq-m (normal 1.9-3.9 L/min/sq-m) LV EDV: 194mL/sq-m (Normal 50-96 mL/sq-m) LV ESV: 44mL/sq-m (Normal 10-40 mL/sq-m) LV SV: 21mL/sq-m (Normal 33-64 mL/sq-m) Right ventricle: RV female RV EF: 39% (Normal 52-80%) Absolute volumes: RV EDV: (Normal 79-175 mL) RV ESV: 81mL  (Normal 13-75 mL) RV SV: 51mL (Normal 56-110 mL) CO: 3.4L/min (Normal 2.7-6 L/min) Indexed volumes: CI: 2.0L/min/sq-m (normal 1.8-3.8 L/min/sq-m) RV EDV: 32mL/sq-m (normal 51-97 mL/sq-m) RV ESV: 66mL/sq-m (Normal 9-42 mL/sq-m) RV SV: 39mL/sq-m (normal 35-61 mL/sq-m) IMPRESSION: 1. Suboptimal image quality affects quality of delayed enhancement images. 2. Moderately reduced left ventricular systolic function with mild dilation, LVEF 32%. Nonspecific pattern of delayed enhancement with midwall septal scar, which can be seen in nonischemic cardiomyopathy. 3. ECV with nonspecific elevation, 32%. No definite findings of cardiac amyloidosis. Given suboptimal image quality, would consider PYP scan and myeloma labs to definitively evaluate for amyloidosis. 4. Mildly reduced RV systolic function with normal RV chamber size. RVEF 39%. No delayed myocardial enhancement. 5.  Moderate right pleural effusion Electronically Signed   By: Soyla Merck M.D.   On: 02/15/2024 14:24   MR CARDIAC VELOCITY FLOW MAP Result Date: 02/15/2024 CLINICAL DATA:  Heart failure, systolic, determine etiology EXAM: MR CARDIA MORPHOLOGY WITHOUT AND WITH CONTRAST; MR CARDIAC VELOCITY FLOW MAPPING TECHNIQUE: The patient was scanned on a 1.5 Tesla Siemens magnet. A dedicated cardiac coil was used. Functional imaging was done using TrueFisp sequences. 2,3, and 4 chamber views were done to assess for RWMA's. Modified Simpson's rule using a short axis stack was used to calculate an ejection fraction on a dedicated work [employer], [employer]. The patient received 6mL GADAVIST  GADOBUTROL   1 MMOL/ML IV SOLN. After 10 minutes inversion recovery sequences were used to assess for infiltration and scar tissue. Phase contrast velocity encoded images obtained x 2. This examination is tailored for evaluation cardiac anatomy and function and provides very limited assessment of noncardiac structures, which are accordingly not evaluated during  interpretation. If there is clinical concern for extracardiac pathology, further evaluation with CT imaging should be considered. FINDINGS: LEFT VENTRICLE: Left ventricular chamber size: Mildly dilated. Left ventricular wall thickness: Moderately increased. Maximal wall thickness 14 mm Left ventricular systolic function: Moderately reduced LVEF = 32% Global hypokinesis. No myocardial edema, T2 = 55 msec Normal first pass perfusion. There is post contrast delayed myocardial enhancement: Midmyocardial stripe of delayed enhancement in the basal septum, nonspecific finding. Normal T1 myocardial nulling kinetics suggest against a diagnosis of cardiac amyloidosis. ECV = 32% RIGHT VENTRICLE: Normal right ventricular chamber size. Normal right ventricular wall thickness. Mildly reduced right ventricular systolic function. RVEF = 39% There are no regional wall motion abnormalities. Global hypokinesis. No post contrast delayed myocardial enhancement. ATRIA: Left atrium: Mildly dilated Right atrium: normal PERICARDIUM: Normal pericardium.  Small pericardial effusion. OTHER: No significant extracardiac findings. MEASUREMENTS: Qp/Qs: Not reported due to image quality. VALVES: Tricuspid aortic valve. Aortic valve regurgitation: Mild, regurgitant fraction 5% Pulmonary valve regurgitation: Trace, regurgitant fraction <1% Mitral valve regurgitation: Trace, regurgitant fraction <1% Tricuspid valve regurgitation: Poor quality phase contrast images, not able to be assessed. Left ventricle: LV female LV EF: 32 % (Normal 52-79%) Absolute volumes: LV EDV: (Normal 78-167 mL) LV ESV: (Normal 21-64 mL) LV SV: 64mL (Normal 52-114 mL) CO: 4.2L/min (Normal 2.7-6.3 L/min) Indexed volumes: CI: 2.5L/min/sq-m (normal 1.9-3.9 L/min/sq-m) LV EDV: 157mL/sq-m (Normal 50-96 mL/sq-m) LV ESV: 1mL/sq-m (Normal 10-40 mL/sq-m) LV SV: 60mL/sq-m (Normal 33-64 mL/sq-m) Right ventricle: RV female RV EF: 39% (Normal 52-80%) Absolute volumes: RV EDV:  (Normal 79-175 mL) RV ESV: 81mL (Normal 13-75 mL) RV SV: 51mL (Normal 56-110 mL) CO: 3.4L/min (Normal 2.7-6 L/min) Indexed volumes: CI: 2.0L/min/sq-m (normal 1.8-3.8 L/min/sq-m) RV EDV: 57mL/sq-m (normal 51-97 mL/sq-m) RV ESV: 23mL/sq-m (Normal 9-42 mL/sq-m) RV SV: 82mL/sq-m (normal 35-61 mL/sq-m) IMPRESSION: 1. Suboptimal image quality affects quality of delayed enhancement images. 2. Moderately reduced left ventricular systolic function with mild dilation, LVEF 32%. Nonspecific pattern of delayed enhancement with midwall septal scar, which can be seen in nonischemic cardiomyopathy. 3. ECV with nonspecific elevation, 32%. No definite findings of cardiac amyloidosis. Given suboptimal image quality, would consider PYP scan and myeloma labs to definitively evaluate for amyloidosis. 4. Mildly reduced RV systolic function with normal RV chamber size. RVEF 39%. No delayed myocardial enhancement. 5.  Moderate right pleural effusion Electronically Signed   By: Soyla Merck M.D.   On: 02/15/2024 14:24   MR CARDIAC VELOCITY FLOW MAP Result Date: 02/15/2024 CLINICAL DATA:  Heart failure, systolic, determine etiology EXAM: MR CARDIA MORPHOLOGY WITHOUT AND WITH CONTRAST; MR CARDIAC VELOCITY FLOW MAPPING TECHNIQUE: The patient was scanned on a 1.5 Tesla Siemens magnet. A dedicated cardiac coil was used. Functional imaging was done using TrueFisp sequences. 2,3, and 4 chamber views were done to assess for RWMA's. Modified Simpson's rule using a short axis stack was used to calculate an ejection fraction on a dedicated work [employer] officer, [employer]. The patient received 6mL GADAVIST  GADOBUTROL  1 MMOL/ML IV SOLN. After 10 minutes inversion recovery sequences were used to assess for infiltration and scar tissue. Phase contrast velocity encoded images obtained x 2. This examination is tailored for evaluation cardiac anatomy  and function and provides very limited assessment of noncardiac structures, which are  accordingly not evaluated during interpretation. If there is clinical concern for extracardiac pathology, further evaluation with CT imaging should be considered. FINDINGS: LEFT VENTRICLE: Left ventricular chamber size: Mildly dilated. Left ventricular wall thickness: Moderately increased. Maximal wall thickness 14 mm Left ventricular systolic function: Moderately reduced LVEF = 32% Global hypokinesis. No myocardial edema, T2 = 55 msec Normal first pass perfusion. There is post contrast delayed myocardial enhancement: Midmyocardial stripe of delayed enhancement in the basal septum, nonspecific finding. Normal T1 myocardial nulling kinetics suggest against a diagnosis of cardiac amyloidosis. ECV = 32% RIGHT VENTRICLE: Normal right ventricular chamber size. Normal right ventricular wall thickness. Mildly reduced right ventricular systolic function. RVEF = 39% There are no regional wall motion abnormalities. Global hypokinesis. No post contrast delayed myocardial enhancement. ATRIA: Left atrium: Mildly dilated Right atrium: normal PERICARDIUM: Normal pericardium.  Small pericardial effusion. OTHER: No significant extracardiac findings. MEASUREMENTS: Qp/Qs: Not reported due to image quality. VALVES: Tricuspid aortic valve. Aortic valve regurgitation: Mild, regurgitant fraction 5% Pulmonary valve regurgitation: Trace, regurgitant fraction <1% Mitral valve regurgitation: Trace, regurgitant fraction <1% Tricuspid valve regurgitation: Poor quality phase contrast images, not able to be assessed. Left ventricle: LV female LV EF: 32 % (Normal 52-79%) Absolute volumes: LV EDV: (Normal 78-167 mL) LV ESV: (Normal 21-64 mL) LV SV: 64mL (Normal 52-114 mL) CO: 4.2L/min (Normal 2.7-6.3 L/min) Indexed volumes: CI: 2.5L/min/sq-m (normal 1.9-3.9 L/min/sq-m) LV EDV: 144mL/sq-m (Normal 50-96 mL/sq-m) LV ESV: 73mL/sq-m (Normal 10-40 mL/sq-m) LV SV: 43mL/sq-m (Normal 33-64 mL/sq-m) Right ventricle: RV female RV EF: 39% (Normal  52-80%) Absolute volumes: RV EDV: (Normal 79-175 mL) RV ESV: 81mL (Normal 13-75 mL) RV SV: 51mL (Normal 56-110 mL) CO: 3.4L/min (Normal 2.7-6 L/min) Indexed volumes: CI: 2.0L/min/sq-m (normal 1.8-3.8 L/min/sq-m) RV EDV: 21mL/sq-m (normal 51-97 mL/sq-m) RV ESV: 37mL/sq-m (Normal 9-42 mL/sq-m) RV SV: 19mL/sq-m (normal 35-61 mL/sq-m) IMPRESSION: 1. Suboptimal image quality affects quality of delayed enhancement images. 2. Moderately reduced left ventricular systolic function with mild dilation, LVEF 32%. Nonspecific pattern of delayed enhancement with midwall septal scar, which can be seen in nonischemic cardiomyopathy. 3. ECV with nonspecific elevation, 32%. No definite findings of cardiac amyloidosis. Given suboptimal image quality, would consider PYP scan and myeloma labs to definitively evaluate for amyloidosis. 4. Mildly reduced RV systolic function with normal RV chamber size. RVEF 39%. No delayed myocardial enhancement. 5.  Moderate right pleural effusion Electronically Signed   By: Soyla Merck M.D.   On: 02/15/2024 14:24     Medications:     Scheduled Medications:  Chlorhexidine  Gluconate Cloth  6 each Topical Daily   enoxaparin  (LOVENOX ) injection  40 mg Subcutaneous QHS   insulin  aspart  0-15 Units Subcutaneous TID WC   insulin  aspart  0-5 Units Subcutaneous QHS   insulin  glargine  18 Units Subcutaneous QHS   insulin  starter kit- pen needles  1 kit Other Once   Ensure Max Protein  11 oz Oral BID BM   sacubitril -valsartan   1 tablet Oral BID   sodium chloride  flush  10-40 mL Intracatheter Q12H   spironolactone   25 mg Oral Daily    Infusions:  furosemide  (LASIX ) 200 mg in dextrose  5 % 100 mL (2 mg/mL) infusion 20 mg/hr (02/16/24 0220)    PRN Medications: ondansetron  **OR** ondansetron  (ZOFRAN ) IV, polyethylene glycol, sodium chloride  flush    Patient Profile  Christie Anderson is a 48 y.o. female with history of tobacco use, she has  not received any healthcare in > 10 years.  H/O tobacco abuse.    A/C HFrEF possible infiltrative disease.  Assessment/Plan  1. Acute HFrEF - Echo LVEF 35-40% RV mildly reduced. Marked LVH with low volts on EKG.  - ? Infiltrative disease. BNP elevated. No previous cardiac diagnosis. Mom died from HF.  - Check myeloma panel.  - EKG low voltage with poor anterior R wave progression consistent with underlying amyloid.  - NPO for RHC with biopsy +/- LHC this morning.  - CMRI yesterday with LVEF 32%, nonspecific pattern of delayed enhancement with midwall septal scar, seen in Ni[facility]. No definite findings of cardiac amyloidosis. RVEF 39%. Mod pleural effusion.  - Remains volume overload. Orthopnea better today. CVP <5?. Continue lasix  gtt at 20 mg/hr. Depending of RHC #s may need to repeat diamox  - Co-ox 88%.  - Continue spiro 25 mg daily  - Hold entresto  this morning with soft BP prior to cath. Resume PM dose.  - Jardiance  stopped with A1c >10 - Continue UNNA boots - Avoid BB while volume overloaded   2. Uncontrolled DM  Hgb A1C 14.  On SSI  Off SGLT2i with A1c >10 Needs PCP follow up.   3. Pleural Effusion R>L  Continue to diurese with IV lasix .  PCXR 9/24 with resolved L pleural effusion and decrease in size of R pleural effusion Mod R pleural effusion on cMRI   4. Tobacco Abuse Cessation advised  NPO for RHC with biopsy +/- LHC this morning.   Length of Stay: 7  Beckey LITTIE Coe, NP  02/16/2024, 7:11 AM  Advanced Heart Failure Team Pager [phone] (M-F; 7a - 5p)  Please contact [facility] for night-coverage after hours (5p -7a ) and weekends on [email or web address]

## 2024-02-16 NOTE — Telephone Encounter (Signed)
 [employer] is requesting proof of income before making a determination for this patient. Spoke to patient by phone, she will provide [phone] when able.

## 2024-02-16 NOTE — Interval H&P Note (Signed)
 History and Physical Interval Note:  02/16/2024 9:16 AM  Christie Anderson  has presented today for surgery, with the diagnosis of heart failure.  The various methods of treatment have been discussed with the patient and family. After consideration of risks, benefits and other options for treatment, the patient has consented to  Procedure(s): RIGHT HEART CATH (N/A) ENDOMYOCARDIAL BIOPSY (N/A) as a surgical intervention.  The patient's history has been reviewed, patient examined, no change in status, stable for surgery.  I have reviewed the patient's chart and labs.  Questions were answered to the patient's satisfaction.     Christie Anderson

## 2024-02-16 NOTE — TOC Transition Note (Addendum)
 Transition of Care [facility]) - Discharge Note   Patient Details  Name: Christie Anderson MRN: [id number] Date of Birth: 04-25-1976  Transition of Care [facility] CM/SW Contact:  Waddell Barnie Rama, RN Phone Number: 02/16/2024, 11:42 AM   Clinical Narrative:    For dc today, NCM assisted with Match Card and the HF pharmacist is also looking into to see which is best the Match or the HF fund for patient.  Per [facility] pharmacy, will go thru Match Card and the meds are ready.   Final next level of care: Home/Self Care Barriers to Discharge: Continued Medical Work up   Patient Goals and CMS Choice Patient states their goals for this hospitalization and ongoing recovery are:: wants to get better          Discharge Placement                       Discharge Plan and Services Additional resources added to the After Visit Summary for     Discharge Planning Services: CM Consult                                 Social Drivers of Health (SDOH) Interventions SDOH Screenings   Food Insecurity: No Food Insecurity (02/10/2024)  Housing: Low Risk  (02/10/2024)  Transportation Needs: No Transportation Needs (02/10/2024)  Utilities: Not At Risk (02/10/2024)  Depression (PHQ2-9): High Risk (11/14/2023)  Tobacco Use: High Risk (02/12/2024)     Readmission Risk Interventions     No data to display

## 2024-02-16 NOTE — Discharge Summary (Signed)
 Physician Discharge Summary  Christie Anderson FMW:[id number] DOB: December 27, 1975 DOA: 02/09/2024  PCP: Mancil Pfeiffer, PA-C  Admit date: 02/09/2024 Discharge date: 02/16/2024  Time spent: 45 minutes  Recommendations for Outpatient Follow-up:  Advanced heart failure clinic next week 10/2 BMP in 1 week PCP in 2 weeks   Discharge Diagnoses:  Principal Problem: Acute systolic CHF, low output   Essential hypertension   Hypokalemia   Type 2 diabetes mellitus with hyperglycemia, uncontrolled   Protein-calorie malnutrition, severe   Discharge Condition: Improved  Diet recommendation: Low-sodium, diabetic  Filed Weights   02/14/24 0355 02/15/24 0500 02/16/24 0523  Weight: 65.4 kg 59.5 kg 54.4 kg    History of present illness:  48/F w recent diagnosis of DM2 on insulin  presented with worsening dyspnea, orthopnea and lower extremity edema. Progressive and worsening symptoms, reported 40 lbs weight gain, difficulty ambulating due to dyspnea and peripheral edema.  Labs- cr 0,43 , BNP 1,135, troponin 18 and 18  CXR w positive cardiomegaly with bilateral hilar vascular congestion, bilateral pleural effusions more right than left.  -placed on furosemide  for diuresis Echocardiogram with reduced LV systolic function  -9/23 transferred from [facility] to [facility] for further heart failure therapy and diagnostics.   Hospital Course:   Acute systolic CHF, new diagnosis -Echo with EF 35 to 40%, moderate LVH, grade I DD, mildly reduced RV  -Followed by heart failure team, improved with diuresis, weight down 50 LB to 119 at discharge - Switch to oral Lasix , Entresto  and Aldactone  - Off SGLT2i due to uncontrolled diabetes -Underwent right heart cath with endomyocardial biopsy this morning, noted to have low filling pressures, severely low cardiac index at 1.2, cleared for discharge home by CHF team -Med sent to [facility], she has a follow-up next Thursday 10/2 in clinic - Follow-up on endomyocardial biopsy results    Uncontrolled type 2 diabetes mellitus with hyperglycemia - A1c was 14 in July, repeat 12.6 - Diagnosed 7 years ago, not on any treatment - Poor candidate for SGLT2i, started on Basaglar  insulin , given coupon for insulin  for 1 year, insulin  teaching completed, seen by diabetes educator -Will send referral for PCP   Essential hypertension -as above   Hypokalemia -repleted.    Protein-calorie malnutrition, severe Continue with nutritional supplements.     Discharge Exam: Vitals:   02/16/24 1030 02/16/24 1045  BP: (!) 84/68 125/77  Pulse:    Resp:    Temp:    SpO2:     Gen: Awake, Alert, Oriented X 3,  HEENT: no JVD Lungs: Good air movement bilaterally, CTAB CVS: S1S2/RRR Abd: soft, Non tender, non distended, BS present Extremities: No edema Skin: no new rashes on exposed skin   Discharge Instructions   Discharge Instructions     Amb Referral to HF Clinic   Complete by: As directed    Reason for referral: Systolic HF   Diet - low sodium heart healthy   Complete by: As directed    Increase activity slowly   Complete by: As directed       Allergies as of 02/16/2024       Reactions   Beef-derived Drug Products Hives, Swelling   Pork-derived Products Hives, Swelling   Amoxicillin Other (See Comments)   Unknown    Doxycycline Nausea And Vomiting   Penicillins Other (See Comments)   Unknown         Medication List     STOP taking these medications    insulin  glargine 100 UNIT/ML injection Commonly known as:  Lantus  Replaced by: Basaglar  KwikPen 100 UNIT/ML       TAKE these medications    Basaglar  KwikPen 100 UNIT/ML Inject 25 Units into the skin every evening. May substitute as needed per insurance. Replaces: insulin  glargine 100 UNIT/ML injection   Blood Glucose Monitoring Suppl Devi 1 each by Does not apply route 3 (three) times daily. May dispense any manufacturer covered by patient's insurance.   BLOOD GLUCOSE TEST STRIPS Strp 1 each by  Does not apply route 3 (three) times daily. Use as directed to check blood sugar. May dispense any manufacturer covered by patient's insurance and fits patient's device.   furosemide  40 MG tablet Commonly known as: LASIX  Take 1 tablet (40 mg total) by mouth daily. Start taking on: February 17, 2024   INSULIN  SYRINGE .5CC/30GX5/16 30G X 5/16 0.5 ML Misc Use one syringe and needle per insulin  injection.   Lancet Device Misc 1 each by Does not apply route 3 (three) times daily. May dispense any manufacturer covered by patient's insurance.   Lancets Misc 1 each by Does not apply route 3 (three) times daily. Use as directed to check blood sugar. May dispense any manufacturer covered by patient's insurance and fits patient's device.   Pen Needles 31G X 5 MM Misc 1 each by Does not apply route 3 (three) times daily. May dispense any manufacturer covered by patient's insurance.   sacubitril -valsartan  24-26 MG Commonly known as: ENTRESTO  Take 1 tablet by mouth 2 (two) times daily.   spironolactone  25 MG tablet Commonly known as: ALDACTONE  Take 1 tablet (25 mg total) by mouth daily. Start taking on: February 17, 2024       Allergies  Allergen Reactions   Beef-Derived Drug Products Hives and Swelling   Pork-Derived Products Hives and Swelling   Amoxicillin Other (See Comments)    Unknown    Doxycycline Nausea And Vomiting   Penicillins Other (See Comments)    Unknown     Follow-up Information     [facility]. Go on 02/22/2024.   Specialty: Cardiology Why: Hospital Follow-Uo 02/22/2024 @ 2:45 [facility] Entrance C off of [address] Please bring all medications to follow-up appointment Free Valet Parking at the door or use (Gate Code 1430 to park under the buidling) Contact information: [address] [place]  [phone]        [place], [place], [place] Follow up.    Specialty: Physician Assistant Why: February 21, 2024 at 10:00 am, hospital follow up appt please arrive 15 minutes prior to appt Contact information: [address] [place] [address] [phone]                  The results of significant diagnostics from this hospitalization (including imaging, microbiology, ancillary and laboratory) are listed below for reference.    Significant Diagnostic Studies: CARDIAC CATHETERIZATION Result Date: 02/16/2024 HEMODYNAMICS: RA:   3 mmHg (mean) RV:   19/5 mmHg PA:   17/10 mmHg (13 mean) PCWP:  6 mmHg (mean)    Estimated Fick CO/CI   3.9 L/min, 2.28 L/min/m2 Thermodilution CO/CI   2.3 L/min, 1.2 L/min/m2    TPG    7  mmHg     PVR     1.8-3 Wood Units PAPi      2.5  Post biopsy RA: IMPRESSION: Low pre and post capillary filling pressures Severely reduced cardiac index by TD; moderately reduced by TD  Three RV endomyocardial samples obtained under echo guidance. No pericardial effusion or TR pre or post procedure. Aditya Sabharwal 10:05 AM  MR CARDIAC MORPHOLOGY W WO CONTRAST Result Date: 02/15/2024 CLINICAL DATA:  Heart failure, systolic, determine etiology EXAM: MR CARDIA MORPHOLOGY WITHOUT AND WITH CONTRAST; MR CARDIAC VELOCITY FLOW MAPPING TECHNIQUE: The patient was scanned on a 1.5 Tesla Siemens magnet. A dedicated cardiac coil was used. Functional imaging was done using TrueFisp sequences. 2,3, and 4 chamber views were done to assess for RWMA's. Modified Simpson's rule using a short axis stack was used to calculate an ejection fraction on a dedicated work [employer], [employer]. The patient received 6mL GADAVIST  GADOBUTROL  1 MMOL/ML IV SOLN. After 10 minutes inversion recovery sequences were used to assess for infiltration and scar tissue. Phase contrast velocity encoded images obtained x 2. This examination is tailored for evaluation cardiac anatomy and function and provides very limited assessment of noncardiac structures,  which are accordingly not evaluated during interpretation. If there is clinical concern for extracardiac pathology, further evaluation with CT imaging should be considered. FINDINGS: LEFT VENTRICLE: Left ventricular chamber size: Mildly dilated. Left ventricular wall thickness: Moderately increased. Maximal wall thickness 14 mm Left ventricular systolic function: Moderately reduced LVEF = 32% Global hypokinesis. No myocardial edema, T2 = 55 msec Normal first pass perfusion. There is post contrast delayed myocardial enhancement: Midmyocardial stripe of delayed enhancement in the basal septum, nonspecific finding. Normal T1 myocardial nulling kinetics suggest against a diagnosis of cardiac amyloidosis. ECV = 32% RIGHT VENTRICLE: Normal right ventricular chamber size. Normal right ventricular wall thickness. Mildly reduced right ventricular systolic function. RVEF = 39% There are no regional wall motion abnormalities. Global hypokinesis. No post contrast delayed myocardial enhancement. ATRIA: Left atrium: Mildly dilated Right atrium: normal PERICARDIUM: Normal pericardium.  Small pericardial effusion. OTHER: No significant extracardiac findings. MEASUREMENTS: Qp/Qs: Not reported due to image quality. VALVES: Tricuspid aortic valve. Aortic valve regurgitation: Mild, regurgitant fraction 5% Pulmonary valve regurgitation: Trace, regurgitant fraction <1% Mitral valve regurgitation: Trace, regurgitant fraction <1% Tricuspid valve regurgitation: Poor quality phase contrast images, not able to be assessed. Left ventricle: LV female LV EF: 32 % (Normal 52-79%) Absolute volumes: LV EDV: (Normal 78-167 mL) LV ESV: (Normal 21-64 mL) LV SV: 64mL (Normal 52-114 mL) CO: 4.2L/min (Normal 2.7-6.3 L/min) Indexed volumes: CI: 2.5L/min/sq-m (normal 1.9-3.9 L/min/sq-m) LV EDV: 185mL/sq-m (Normal 50-96 mL/sq-m) LV ESV: 58mL/sq-m (Normal 10-40 mL/sq-m) LV SV: 58mL/sq-m (Normal 33-64 mL/sq-m) Right ventricle: RV female RV EF: 39%  (Normal 52-80%) Absolute volumes: RV EDV: (Normal 79-175 mL) RV ESV: 81mL (Normal 13-75 mL) RV SV: 51mL (Normal 56-110 mL) CO: 3.4L/min (Normal 2.7-6 L/min) Indexed volumes: CI: 2.0L/min/sq-m (normal 1.8-3.8 L/min/sq-m) RV EDV: 64mL/sq-m (normal 51-97 mL/sq-m) RV ESV: 58mL/sq-m (Normal 9-42 mL/sq-m) RV SV: 46mL/sq-m (normal 35-61 mL/sq-m) IMPRESSION: 1. Suboptimal image quality affects quality of delayed enhancement images. 2. Moderately reduced left ventricular systolic function with mild dilation, LVEF 32%. Nonspecific pattern of delayed enhancement with midwall septal scar, which can be seen in nonischemic cardiomyopathy. 3. ECV with nonspecific elevation, 32%. No definite findings of cardiac amyloidosis. Given suboptimal image quality, would consider PYP scan and myeloma labs to definitively evaluate for amyloidosis. 4. Mildly reduced RV systolic function with normal RV chamber size. RVEF 39%. No delayed myocardial enhancement. 5.  Moderate right pleural effusion Electronically Signed   By: Soyla Merck M.D.   On: 02/15/2024 14:24   MR CARDIAC VELOCITY FLOW MAP Result Date: 02/15/2024 CLINICAL DATA:  Heart  failure, systolic, determine etiology EXAM: MR CARDIA MORPHOLOGY WITHOUT AND WITH CONTRAST; MR CARDIAC VELOCITY FLOW MAPPING TECHNIQUE: The patient was scanned on a 1.5 Tesla Siemens magnet. A dedicated cardiac coil was used. Functional imaging was done using TrueFisp sequences. 2,3, and 4 chamber views were done to assess for RWMA's. Modified Simpson's rule using a short axis stack was used to calculate an ejection fraction on a dedicated work [employer] officer, [employer]. The patient received 6mL GADAVIST  GADOBUTROL  1 MMOL/ML IV SOLN. After 10 minutes inversion recovery sequences were used to assess for infiltration and scar tissue. Phase contrast velocity encoded images obtained x 2. This examination is tailored for evaluation cardiac anatomy and function and provides very limited assessment  of noncardiac structures, which are accordingly not evaluated during interpretation. If there is clinical concern for extracardiac pathology, further evaluation with CT imaging should be considered. FINDINGS: LEFT VENTRICLE: Left ventricular chamber size: Mildly dilated. Left ventricular wall thickness: Moderately increased. Maximal wall thickness 14 mm Left ventricular systolic function: Moderately reduced LVEF = 32% Global hypokinesis. No myocardial edema, T2 = 55 msec Normal first pass perfusion. There is post contrast delayed myocardial enhancement: Midmyocardial stripe of delayed enhancement in the basal septum, nonspecific finding. Normal T1 myocardial nulling kinetics suggest against a diagnosis of cardiac amyloidosis. ECV = 32% RIGHT VENTRICLE: Normal right ventricular chamber size. Normal right ventricular wall thickness. Mildly reduced right ventricular systolic function. RVEF = 39% There are no regional wall motion abnormalities. Global hypokinesis. No post contrast delayed myocardial enhancement. ATRIA: Left atrium: Mildly dilated Right atrium: normal PERICARDIUM: Normal pericardium.  Small pericardial effusion. OTHER: No significant extracardiac findings. MEASUREMENTS: Qp/Qs: Not reported due to image quality. VALVES: Tricuspid aortic valve. Aortic valve regurgitation: Mild, regurgitant fraction 5% Pulmonary valve regurgitation: Trace, regurgitant fraction <1% Mitral valve regurgitation: Trace, regurgitant fraction <1% Tricuspid valve regurgitation: Poor quality phase contrast images, not able to be assessed. Left ventricle: LV female LV EF: 32 % (Normal 52-79%) Absolute volumes: LV EDV: (Normal 78-167 mL) LV ESV: (Normal 21-64 mL) LV SV: 64mL (Normal 52-114 mL) CO: 4.2L/min (Normal 2.7-6.3 L/min) Indexed volumes: CI: 2.5L/min/sq-m (normal 1.9-3.9 L/min/sq-m) LV EDV: 114mL/sq-m (Normal 50-96 mL/sq-m) LV ESV: 86mL/sq-m (Normal 10-40 mL/sq-m) LV SV: 12mL/sq-m (Normal 33-64 mL/sq-m) Right  ventricle: RV female RV EF: 39% (Normal 52-80%) Absolute volumes: RV EDV: (Normal 79-175 mL) RV ESV: 81mL (Normal 13-75 mL) RV SV: 51mL (Normal 56-110 mL) CO: 3.4L/min (Normal 2.7-6 L/min) Indexed volumes: CI: 2.0L/min/sq-m (normal 1.8-3.8 L/min/sq-m) RV EDV: 76mL/sq-m (normal 51-97 mL/sq-m) RV ESV: 26mL/sq-m (Normal 9-42 mL/sq-m) RV SV: 65mL/sq-m (normal 35-61 mL/sq-m) IMPRESSION: 1. Suboptimal image quality affects quality of delayed enhancement images. 2. Moderately reduced left ventricular systolic function with mild dilation, LVEF 32%. Nonspecific pattern of delayed enhancement with midwall septal scar, which can be seen in nonischemic cardiomyopathy. 3. ECV with nonspecific elevation, 32%. No definite findings of cardiac amyloidosis. Given suboptimal image quality, would consider PYP scan and myeloma labs to definitively evaluate for amyloidosis. 4. Mildly reduced RV systolic function with normal RV chamber size. RVEF 39%. No delayed myocardial enhancement. 5.  Moderate right pleural effusion Electronically Signed   By: Soyla Merck M.D.   On: 02/15/2024 14:24   MR CARDIAC VELOCITY FLOW MAP Result Date: 02/15/2024 CLINICAL DATA:  Heart failure, systolic, determine etiology EXAM: MR CARDIA MORPHOLOGY WITHOUT AND WITH CONTRAST; MR CARDIAC VELOCITY FLOW MAPPING TECHNIQUE: The patient was scanned on a 1.5 Tesla Siemens magnet. A dedicated cardiac coil was used. Functional  imaging was done using TrueFisp sequences. 2,3, and 4 chamber views were done to assess for RWMA's. Modified Simpson's rule using a short axis stack was used to calculate an ejection fraction on a dedicated work [employer] officer, [employer]. The patient received 6mL GADAVIST  GADOBUTROL  1 MMOL/ML IV SOLN. After 10 minutes inversion recovery sequences were used to assess for infiltration and scar tissue. Phase contrast velocity encoded images obtained x 2. This examination is tailored for evaluation cardiac anatomy and function and  provides very limited assessment of noncardiac structures, which are accordingly not evaluated during interpretation. If there is clinical concern for extracardiac pathology, further evaluation with CT imaging should be considered. FINDINGS: LEFT VENTRICLE: Left ventricular chamber size: Mildly dilated. Left ventricular wall thickness: Moderately increased. Maximal wall thickness 14 mm Left ventricular systolic function: Moderately reduced LVEF = 32% Global hypokinesis. No myocardial edema, T2 = 55 msec Normal first pass perfusion. There is post contrast delayed myocardial enhancement: Midmyocardial stripe of delayed enhancement in the basal septum, nonspecific finding. Normal T1 myocardial nulling kinetics suggest against a diagnosis of cardiac amyloidosis. ECV = 32% RIGHT VENTRICLE: Normal right ventricular chamber size. Normal right ventricular wall thickness. Mildly reduced right ventricular systolic function. RVEF = 39% There are no regional wall motion abnormalities. Global hypokinesis. No post contrast delayed myocardial enhancement. ATRIA: Left atrium: Mildly dilated Right atrium: normal PERICARDIUM: Normal pericardium.  Small pericardial effusion. OTHER: No significant extracardiac findings. MEASUREMENTS: Qp/Qs: Not reported due to image quality. VALVES: Tricuspid aortic valve. Aortic valve regurgitation: Mild, regurgitant fraction 5% Pulmonary valve regurgitation: Trace, regurgitant fraction <1% Mitral valve regurgitation: Trace, regurgitant fraction <1% Tricuspid valve regurgitation: Poor quality phase contrast images, not able to be assessed. Left ventricle: LV female LV EF: 32 % (Normal 52-79%) Absolute volumes: LV EDV: (Normal 78-167 mL) LV ESV: (Normal 21-64 mL) LV SV: 64mL (Normal 52-114 mL) CO: 4.2L/min (Normal 2.7-6.3 L/min) Indexed volumes: CI: 2.5L/min/sq-m (normal 1.9-3.9 L/min/sq-m) LV EDV: 172mL/sq-m (Normal 50-96 mL/sq-m) LV ESV: 57mL/sq-m (Normal 10-40 mL/sq-m) LV SV: 31mL/sq-m  (Normal 33-64 mL/sq-m) Right ventricle: RV female RV EF: 39% (Normal 52-80%) Absolute volumes: RV EDV: (Normal 79-175 mL) RV ESV: 81mL (Normal 13-75 mL) RV SV: 51mL (Normal 56-110 mL) CO: 3.4L/min (Normal 2.7-6 L/min) Indexed volumes: CI: 2.0L/min/sq-m (normal 1.8-3.8 L/min/sq-m) RV EDV: 63mL/sq-m (normal 51-97 mL/sq-m) RV ESV: 7mL/sq-m (Normal 9-42 mL/sq-m) RV SV: 49mL/sq-m (normal 35-61 mL/sq-m) IMPRESSION: 1. Suboptimal image quality affects quality of delayed enhancement images. 2. Moderately reduced left ventricular systolic function with mild dilation, LVEF 32%. Nonspecific pattern of delayed enhancement with midwall septal scar, which can be seen in nonischemic cardiomyopathy. 3. ECV with nonspecific elevation, 32%. No definite findings of cardiac amyloidosis. Given suboptimal image quality, would consider PYP scan and myeloma labs to definitively evaluate for amyloidosis. 4. Mildly reduced RV systolic function with normal RV chamber size. RVEF 39%. No delayed myocardial enhancement. 5.  Moderate right pleural effusion Electronically Signed   By: Soyla Merck M.D.   On: 02/15/2024 14:24   DG CHEST PORT 1 VIEW Result Date: 02/14/2024 CLINICAL DATA:  Hypoxia EXAM: PORTABLE CHEST 1 VIEW COMPARISON:  February 09, 2024 FINDINGS: Interval placement of right-sided PICC line with tip at the cavoatrial junction. Improved aeration of the lungs with likely decrease in size of small right pleural effusion and likely resolution of left pleural effusion. Persistent left basilar consolidation/atelectasis. Cardiomediastinal silhouette is within normal limits. No acute osseous findings. IMPRESSION: Interval placement of right upper extremity PICC line with tip  at the cavoatrial junction. Interval decrease in size of small right pleural effusion and resolution of left pleural effusion. Improved aeration of the lung bases with persistent likely right lung base atelectasis. Electronically Signed   By: Michaeline Blanch  M.D.   On: 02/14/2024 15:01   US  EKG SITE RITE Result Date: 02/13/2024 If Site Rite image not attached, placement could not be confirmed due to current cardiac rhythm.  ECHOCARDIOGRAM COMPLETE Result Date: 02/10/2024    ECHOCARDIOGRAM REPORT   Patient Name:   HELAINE YACKEL Date of Exam: 02/10/2024 Medical Rec #:  [id number]      Height:       69.0 in Accession #:    [id number]     Weight:       179.5 lb Date of Birth:  06/26/1975      BSA:          1.973 m Patient Age:    48 years       BP:           152/99 mmHg Patient Gender: F              HR:           83 bpm. Exam Location:  [facility] Procedure: 2D Echo, Cardiac Doppler, Color Doppler and Strain Analysis (Both            Spectral and Color Flow Doppler were utilized during procedure). Indications:    I50.40* Unspecified combined systolic (congestive) and diastolic                 (congestive) heart failure  History:        Patient has no prior history of Echocardiogram examinations.                 CHF; Risk Factors:Diabetes.  Sonographer:    Ellouise Mose RDCS Referring Phys: [phone] JACOB JINNY PEEL  Sonographer Comments: Delay to get patient in bed. IMPRESSIONS  1. Left ventricular ejection fraction, by estimation, is 35 to 40%. The left ventricle has moderately decreased function. The left ventricle demonstrates global hypokinesis. There is moderate concentric left ventricular hypertrophy. Left ventricular diastolic parameters are consistent with Grade I diastolic dysfunction (impaired relaxation). Elevated left atrial pressure. The average left ventricular global longitudinal strain is -13.5 %. The global longitudinal strain is abnormal.  2. Right ventricular systolic function is mildly reduced. The right ventricular size is normal. Tricuspid regurgitation signal is inadequate for assessing PA pressure.  3. Left atrial size was mildly dilated.  4. Large pleural effusion in the left lateral region.  5. The mitral valve is normal in structure. Trivial mitral  valve regurgitation.  6. The aortic valve is tricuspid. Aortic valve regurgitation is not visualized. No aortic stenosis is present.  7. The inferior vena cava is normal in size with greater than 50% respiratory variability, suggesting right atrial pressure of 3 mmHg. Comparison(s): Marked LVH with low QRS voltage on ECG suggests infiltrative cardiomyopathy, in particular cardiac amyloidosis. Conclusion(s)/Recommendation(s): Consider cardiac MRI or PYP nuclear scintigraphy for infiltrative cardiomyopathy. FINDINGS  Left Ventricle: Left ventricular ejection fraction, by estimation, is 35 to 40%. The left ventricle has moderately decreased function. The left ventricle demonstrates global hypokinesis. The average left ventricular global longitudinal strain is -13.5 %. Strain was performed and the global longitudinal strain is abnormal. The left ventricular internal cavity size was normal in size. There is moderate concentric left ventricular hypertrophy. Left ventricular diastolic parameters are consistent with Grade I diastolic  dysfunction (impaired relaxation). Elevated left atrial pressure. Right Ventricle: The right ventricular size is normal. No increase in right ventricular wall thickness. Right ventricular systolic function is mildly reduced. Tricuspid regurgitation signal is inadequate for assessing PA pressure. Left Atrium: Left atrial size was mildly dilated. Right Atrium: Right atrial size was normal in size. Pericardium: There is no evidence of pericardial effusion. Mitral Valve: The mitral valve is normal in structure. Mild to moderate mitral annular calcification. Trivial mitral valve regurgitation. Tricuspid Valve: The tricuspid valve is normal in structure. Tricuspid valve regurgitation is not demonstrated. Aortic Valve: The aortic valve is tricuspid. Aortic valve regurgitation is not visualized. No aortic stenosis is present. Pulmonic Valve: The pulmonic valve was not well visualized. Pulmonic valve  regurgitation is trivial. Aorta: The aortic root and ascending aorta are structurally normal, with no evidence of dilitation. Venous: The inferior vena cava is normal in size with greater than 50% respiratory variability, suggesting right atrial pressure of 3 mmHg. IAS/Shunts: No atrial level shunt detected by color flow Doppler. Additional Comments: There is a large pleural effusion in the left lateral region.  LEFT VENTRICLE PLAX 2D LVIDd:         4.40 cm      Diastology LVIDs:         3.50 cm      LV e' medial:    4.24 cm/s LV PW:         1.50 cm      LV E/e' medial:  12.2 LV IVS:        1.65 cm      LV e' lateral:   3.70 cm/s LVOT diam:     2.20 cm      LV E/e' lateral: 13.9 LV SV:         54 LV SV Index:   27           2D Longitudinal Strain LVOT Area:     3.80 cm     2D Strain GLS Avg:     -13.5 %  LV Volumes (MOD) LV vol d, MOD A2C: 126.2 ml LV vol d, MOD A4C: 123.0 ml LV vol s, MOD A2C: 89.2 ml LV vol s, MOD A4C: 72.2 ml LV SV MOD A2C:     37.0 ml LV SV MOD A4C:     123.0 ml LV SV MOD BP:      43.7 ml RIGHT VENTRICLE             IVC RV S prime:     10.10 cm/s  IVC diam: 1.70 cm TAPSE (M-mode): 1.6 cm LEFT ATRIUM           Index        RIGHT ATRIUM          Index LA diam:      3.40 cm 1.72 cm/m   RA Area:     9.11 cm LA Vol (A2C): 31.0 ml 15.71 ml/m  RA Volume:   16.50 ml 8.36 ml/m LA Vol (A4C): 47.0 ml 23.81 ml/m  AORTIC VALVE LVOT Vmax:   86.40 cm/s LVOT Vmean:  55.300 cm/s LVOT VTI:    0.142 m  AORTA Ao Root diam: 3.50 cm Ao Asc diam:  3.60 cm MITRAL VALVE MV Area (PHT): 3.91 cm    SHUNTS MV Decel Time: 194 msec    Systemic VTI:  0.14 m MV E velocity: 51.60 cm/s  Systemic Diam: 2.20 cm MV A velocity: 66.50 cm/s MV E/A ratio:  0.78  Jerel Balding MD Electronically signed by Jerel Balding MD Signature Date/Time: 02/10/2024/1:31:24 PM    Final    DG Chest Portable 1 View Result Date: 02/09/2024 CLINICAL DATA:  Difficulty breathing as well as difficulty walking. Patient states fluid buildup. EXAM:  PORTABLE CHEST 1 VIEW COMPARISON:  11/29/2023 FINDINGS: Lungs are somewhat hypoinflated with interval worsening of a moderate size right pleural effusion and small left pleural effusion. There is likely associated bibasilar atelectasis. Infection in the lung bases is possible. Remainder of the exam is unchanged. IMPRESSION: Interval worsening of moderate size right pleural effusion and small left pleural effusion with likely associated bibasilar atelectasis. Infection in the lung bases is possible. Electronically Signed   By: Toribio Agreste M.D.   On: 02/09/2024 16:54    Microbiology: Recent Results (from the past 240 hours)  C Difficile Quick Screen w PCR reflex     Status: None   Collection Time: 02/11/24 10:26 AM   Specimen: STOOL  Result Value Ref Range Status   C Diff antigen NEGATIVE NEGATIVE Final   C Diff toxin NEGATIVE NEGATIVE Final   C Diff interpretation No C. difficile detected.  Final    Comment: Performed at [facility], [address], [place], [place] [address]  Gastrointestinal Panel by PCR , Stool     Status: None   Collection Time: 02/11/24 10:26 AM   Specimen: STOOL  Result Value Ref Range Status   Campylobacter species NOT DETECTED NOT DETECTED Final   Plesimonas shigelloides NOT DETECTED NOT DETECTED Final   Salmonella species NOT DETECTED NOT DETECTED Final   Yersinia enterocolitica NOT DETECTED NOT DETECTED Final   Vibrio species NOT DETECTED NOT DETECTED Final   Vibrio cholerae NOT DETECTED NOT DETECTED Final   Enteroaggregative E coli (EAEC) NOT DETECTED NOT DETECTED Final   Enteropathogenic E coli (EPEC) NOT DETECTED NOT DETECTED Final   Enterotoxigenic E coli (ETEC) NOT DETECTED NOT DETECTED Final   Shiga like toxin producing E coli (STEC) NOT DETECTED NOT DETECTED Final   Shigella/Enteroinvasive E coli (EIEC) NOT DETECTED NOT DETECTED Final   Cryptosporidium NOT DETECTED NOT DETECTED Final   Cyclospora cayetanensis NOT DETECTED NOT DETECTED Final   Entamoeba  histolytica NOT DETECTED NOT DETECTED Final   Giardia lamblia NOT DETECTED NOT DETECTED Final   Adenovirus F40/41 NOT DETECTED NOT DETECTED Final   Astrovirus NOT DETECTED NOT DETECTED Final   Norovirus GI/GII NOT DETECTED NOT DETECTED Final   Rotavirus A NOT DETECTED NOT DETECTED Final   Sapovirus (I, II, IV, and V) NOT DETECTED NOT DETECTED Final    Comment: Performed at [facility], [address] [place], [place] [address]     Labs: Basic Metabolic Panel: Recent Labs  Lab 02/12/24 0528 02/13/24 0228 02/14/24 0500 02/15/24 0520 02/15/24 1735 02/16/24 0550  NA 138 136 135 134* 132* 133*  K 3.8 3.6 3.4* 3.3* 4.0 3.5  CL 103 101 95* 98 93* 95*  CO2 31 28 29 29 29 28   GLUCOSE 111* 56* 244* 138* 320* 160*  BUN 17 18 28* 35* 33* 35*  CREATININE 0.36* 0.39* 0.65 0.72 0.82 0.79  CALCIUM 7.7* 7.9* 7.6* 7.7* 8.0* 7.9*  MG 1.9 1.8 2.3 2.3  --  2.4   Liver Function Tests: Recent Labs  Lab 02/09/24 1625  AST 38  ALT 43  ALKPHOS 176*  BILITOT 0.4  PROT 6.2*  ALBUMIN 2.4*   No results for input(s): LIPASE, AMYLASE in the last 168 hours. No results for input(s):  AMMONIA in the last 168 hours. CBC: Recent Labs  Lab 02/09/24 1625 02/10/24 0533 02/15/24 1729  WBC 13.4* 10.0 10.6*  HGB 16.2* 14.7 14.3  HCT 46.6* 43.1 42.4  MCV 90.3 90.5 92.0  PLT 314 257 226   Cardiac Enzymes: No results for input(s): CKTOTAL, CKMB, CKMBINDEX, TROPONINI in the last 168 hours. BNP: BNP (last 3 results) Recent Labs    02/09/24 1625  BNP 1,135.0*    ProBNP (last 3 results) No results for input(s): PROBNP in the last 8760 hours.  CBG: Recent Labs  Lab 02/15/24 1217 02/15/24 1542 02/15/24 2115 02/15/24 2321 02/16/24 0528  GLUCAP 176* 264* 370* 250* 165*       Signed:  Sigurd Pac MD.  [facility] 02/16/2024, 11:17 AM

## 2024-02-16 NOTE — Progress Notes (Signed)
 Inpatient Diabetes Program Recommendations  AACE/ADA: New Consensus Statement on Inpatient Glycemic Control (2015)  Target Ranges:  Prepandial:   less than 140 mg/dL      Peak postprandial:   less than 180 mg/dL (1-2 hours)      Critically ill patients:  140 - 180 mg/dL   Lab Results  Component Value Date   GLUCAP 256 (H) 02/16/2024   HGBA1C 12.6 (H) 02/13/2024    Review of Glycemic Control  Latest Reference Range & Units 02/15/24 23:21 02/16/24 05:28 02/16/24 11:54  Glucose-Capillary 70 - 99 mg/dL 749 (H) 834 (H) 743 (H)   Inpatient Diabetes Program Recommendations:    Discharge Recommendations: Long acting recommendations: Insulin  Glargine (BASAGLAR ) Kwikpen 25 units daily  Short acting recommendations:  Meal + Correction coverage Insulin  lispro (HUMALOG ) KwikPen  Sensitive Scale.  3 units Meal coverage plus the sensitive scale Supply/Referral recommendations: Pen needles - standard   Use Adult Diabetes Insulin  Treatment Post Discharge order set.  Thanks,  Randall Bullocks, RN, BC-ADM Inpatient Diabetes Coordinator Pager [phone]  (8a-5p)

## 2024-02-18 ENCOUNTER — Telehealth: Payer: Self-pay | Admitting: Family Medicine

## 2024-02-18 ENCOUNTER — Encounter (HOSPITAL_COMMUNITY): Payer: Self-pay | Admitting: Cardiology

## 2024-02-18 NOTE — Telephone Encounter (Signed)
 I received a call from on line nurse call-[facility]  Patient with sugar around 440 In talking with the patient she got out of the hospital yesterday She just started her long-acting insulin  today She took 25 units of bagsalar insulin  30 to 45 minutes before I spoke with patient She stated that she was not instructed to be on any short acting by the hospitalist She is trying to eat healthy diet low glucose She denies any shortness of breath After discussion it was determined that she may need to be on short acting insulin  with meals but given the phone call at 8 PM and given that we have not seen the full effect of the 25 units that she injected we will hold off on short acting today and I will touch base with patient on Sunday morning to get her input regarding what her readings are  If any emergency symptoms to go to ER  Addendum-I tried calling the patient Sunday morning no answer-I will try calling again later Sunday morning

## 2024-02-19 ENCOUNTER — Telehealth: Payer: Self-pay | Admitting: *Deleted

## 2024-02-19 ENCOUNTER — Ambulatory Visit: Payer: Self-pay

## 2024-02-19 DIAGNOSIS — I509 Heart failure, unspecified: Secondary | ICD-10-CM

## 2024-02-19 NOTE — Transitions of Care (Post Inpatient/ED Visit) (Signed)
   02/19/2024  Name: Christie Anderson MRN: [id number] DOB: 22-Feb-1976  Today's TOC FU Call Status: Today's TOC FU Call Status:: Unsuccessful Call (1st Attempt) Unsuccessful Call (1st Attempt) Date: 02/19/24  Attempted to reach the patient regarding the most recent Inpatient/ED visit.  Follow Up Plan: Additional outreach attempts will be made to reach the patient to complete the Transitions of Care (Post Inpatient/ED visit) call.   Mliss Creed [facility], BSN RN Care Manager/ Transition of Care / [facility] [phone]

## 2024-02-19 NOTE — Telephone Encounter (Signed)
 FYI Only or Action Required?: Action required by provider: update on patient condition.- Patient with worsening hyperglycemia  Patient was last seen in primary care on 12/18/2023 by Grooms, Ramona, PA-C.  Called Nurse Triage reporting Hyperglycemia.  Symptoms began several days ago.  Interventions attempted: Prescription medications: Long acting insulin .  Symptoms are: rapidly worsening.  Triage Disposition: Go to ED Now (or PCP Triage)  Patient/caregiver understands and will follow disposition?: No, refuses disposition   Copied from CRM #[id number]. Topic: Clinical - Red Word Triage >> Feb 19, 2024  9:11 AM Carlatta H wrote: [facility] that prompted transfer to Nurse Triage: Patient blood sugar is 455 and 523// >> Feb 19, 2024  9:31 AM Mercedes MATSU wrote: Patient blood sugar is extremely high, requesting to speak with triage nurse says she was speaking prior but got disconnected. Reason for Disposition  Blood glucose > 500 mg/dL (72.1 mmol/L)  Answer Assessment - Initial Assessment Questions Patient with recent discharge for new CHF and Hyperglycemia. Over the weekend Dr Alphonsa has been managing her hyperglycemia with bolus doses of short acting and her long acting prescription. Last insulin  was 7pm- 25unit long acting.   Feels blah, feels like she just needs to lay down. She is able to stand and walk to the the kitchen without any issues just very tired and weak. Called [facility] and was not able to reach RN in office. Dispo to the ED and patient refused  1. BLOOD GLUCOSE: What is your blood glucose level?      455  5am and 523 8am- Had toaster pastry after 5am check 2. ONSET: When did you check the blood glucose?     Today but dealing with it for the last week 3. USUAL RANGE: What is your glucose level usually? (e.g., usual fasting morning value, usual evening value)     A1C was 14- dealing with it since June  5. TYPE 1 or 2:  Do you know what type of diabetes you have?  (e.g., Type  1, Type 2, Gestational; doesn't know)      Type 2 6. INSULIN : Do you take insulin ? What type of insulin (s) do you use? What is the mode of delivery? (syringe, pen; injection or pump)?      Long Acting 25units 7. DIABETES PILLS: Do you take any pills for your diabetes? If Yes, ask: Have you missed taking any pills recently?     Not prescribed  8. OTHER SYMPTOMS: Do you have any symptoms? (e.g., fever, frequent urination, difficulty breathing, dizziness, weakness, vomiting)     Frequent urination but on Lasix , on fluid restriction (2L) due to new onset CHF  Protocols used: Diabetes - High Blood Sugar-A-AH
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
## 2024-02-19 NOTE — Telephone Encounter (Signed)
 Pt states that she is getting a call from [facility] during intro to NT, RN advised pt to call back if incoming call does not answer her questions. NT not initiated.   Copied from CRM #[id number]. Topic: Clinical - Red Word Triage >> Feb 19, 2024  9:11 AM Carlatta H wrote: [facility] that prompted transfer to Nurse Triage: Patient blood sugar is 455 and 523//

## 2024-02-20 ENCOUNTER — Telehealth: Payer: Self-pay | Admitting: *Deleted

## 2024-02-20 ENCOUNTER — Ambulatory Visit: Payer: Self-pay | Admitting: Neurology

## 2024-02-20 LAB — SURGICAL PATHOLOGY

## 2024-02-20 NOTE — Patient Outreach (Signed)
 02/20/2024  Patient ID: Christie Anderson, female   DOB: 1976-05-23, 48 y.o.   MRN: [id number]  Spoke with pt to follow up on blood sugar, pt reports early this morning CBG was 447, pt states she is not at home and cannot check CBG now, pt states she is waiting to follow up with primary care provider tomorrow on 02/21/24.  No other concerns reported.  Mliss Creed [facility], BSN RN Care Manager/ Transition of Care [place]/ [facility] [phone]

## 2024-02-21 ENCOUNTER — Encounter: Payer: Self-pay | Admitting: Physician Assistant

## 2024-02-21 ENCOUNTER — Ambulatory Visit (INDEPENDENT_AMBULATORY_CARE_PROVIDER_SITE_OTHER): Payer: Self-pay | Admitting: Physician Assistant

## 2024-02-21 ENCOUNTER — Telehealth: Payer: Self-pay

## 2024-02-21 VITALS — BP 123/79 | HR 98 | Resp 16 | Ht 69.0 in | Wt 128.0 lb

## 2024-02-21 DIAGNOSIS — I5023 Acute on chronic systolic (congestive) heart failure: Secondary | ICD-10-CM

## 2024-02-21 DIAGNOSIS — E1165 Type 2 diabetes mellitus with hyperglycemia: Secondary | ICD-10-CM

## 2024-02-21 DIAGNOSIS — Z794 Long term (current) use of insulin: Secondary | ICD-10-CM

## 2024-02-21 DIAGNOSIS — I5020 Unspecified systolic (congestive) heart failure: Secondary | ICD-10-CM

## 2024-02-21 DIAGNOSIS — E876 Hypokalemia: Secondary | ICD-10-CM

## 2024-02-21 MED ORDER — INSULIN LISPRO (1 UNIT DIAL) 100 UNIT/ML (KWIKPEN): 8.0000 [IU] | PEN_INJECTOR | Freq: Three times a day (TID) | SUBCUTANEOUS | 2 refills | Status: DC

## 2024-02-21 MED ORDER — BASAGLAR KWIKPEN 100 UNIT/ML ~~LOC~~ SOPN: 30.0000 [IU] | PEN_INJECTOR | Freq: Every evening | SUBCUTANEOUS | 2 refills | Status: DC

## 2024-02-21 NOTE — Assessment & Plan Note (Addendum)
 Persistent hyperglycemia with inadequate control on current insulin  regimen. Diabetic neuropathy with some decreased sensation in lower extremities. A1c improved but still elevated. - Increase Basaglar  to 30 units nightly. - Sliding scale Lispro: 4-16 units with meals based on sugar.  - Discussed diet. Recommended yearly eye exam.  - Consider endocrinology referral if management complexity increases.

## 2024-02-21 NOTE — Progress Notes (Signed)
 Established Patient Office Visit  Subjective   Patient ID: Christie Anderson, female    DOB: 25-Jul-1975  Age: 48 y.o. MRN: [id number]  Chief Complaint  Patient presents with   Hospitalization Follow-up    Admitted 09/19-09/26    Discussed the use of AI scribe software for clinical note transcription with the patient, who gave verbal consent to proceed.  History of Present Illness Christie Anderson is a 48 year old female with heart failure, diabetes, and hypertension who presents with concerns about fluid retention and blood sugar management.  She was recently hospitalized for increased shortness of breath and diagnosed with heart failure, experiencing significant fluid retention of approximately fifty pounds.  Her blood sugar levels have been extremely high, reaching nearly 580 mg/dL, despite using Basaglar . She previously used Lispro as her short-acting insulin  but has not used since discharge. Her eating schedule has changed to 6 AM, 11 AM, and 5 PM since her hospital stay.  She is concerned about fluid retention, noting a weight gain of about four pounds since discharge. She is currently Lasix  and monitors her weight daily, consistently at ~123 pounds at home.  She was previously on Lantus  for long-acting insulin  but was not regularly checking her blood sugar levels before her recent hospitalization. Since discharge, she has been more diligent in monitoring her blood sugar.  Patient declines preventative health exams such as mammogram, colonoscopy, and vaccines at this time.     Review of Systems  Constitutional:  Negative for fever and malaise/fatigue.  Respiratory:  Negative for cough and shortness of breath.   Cardiovascular:  Positive for leg swelling. Negative for chest pain.  Neurological:  Positive for sensory change and weakness. Negative for tingling.      Objective:     BP 123/79   Pulse 98   Resp 16   Ht 5' 9 (1.753 m)   Wt 128 lb 0.6 oz (58.1 kg)   SpO2 98%    BMI 18.91 kg/m    Physical Exam Constitutional:      General: She is not in acute distress.    Appearance: Normal appearance. She is ill-appearing (chronically ill appearing).  HENT:     Head: Normocephalic and atraumatic.     Mouth/Throat:     Mouth: Mucous membranes are moist.     Pharynx: Oropharynx is clear.  Eyes:     Extraocular Movements: Extraocular movements intact.     Conjunctiva/sclera: Conjunctivae normal.  Cardiovascular:     Rate and Rhythm: Normal rate and regular rhythm.     Heart sounds: Normal heart sounds. No murmur heard. Pulmonary:     Effort: Pulmonary effort is normal.     Breath sounds: Normal breath sounds. No rales.  Musculoskeletal:     Right lower leg: Pitting Edema present.     Left lower leg: Pitting Edema present.     Right foot: Normal range of motion.     Left foot: Normal range of motion.  Feet:     Right foot:     Protective Sensation: 5 sites tested.  2 sites sensed.     Skin integrity: Skin integrity normal.     Left foot:     Protective Sensation: 5 sites tested.  3 sites sensed.     Skin integrity: Skin integrity normal.  Skin:    General: Skin is warm and dry.     Findings: No erythema.  Neurological:     General: No focal deficit present.  Mental Status: She is alert and oriented to person, place, and time.  Psychiatric:        Mood and Affect: Mood normal.        Behavior: Behavior normal.     No results found for any visits on 02/21/24.  The ASCVD Risk score (Arnett DK, et al., 2019) failed to calculate for the following reasons:   The valid total cholesterol range is 130 to 320 mg/dL    Assessment & Plan:   Return in about 2 months (around 04/22/2024).   Type 2 diabetes mellitus with hyperglycemia, with long-term current use of insulin  (HCC) Assessment & Plan: Persistent hyperglycemia with inadequate control on current insulin  regimen. Diabetic neuropathy with some decreased sensation in lower extremities. A1c  improved but still elevated. - Increase Basaglar  to 30 units nightly. - Sliding scale Lispro: 4-16 units with meals based on sugar.  - Discussed diet. Recommended yearly eye exam.  - Consider endocrinology referral if management complexity increases.  Orders: -     Microalbumin / creatinine urine ratio -     Basaglar  KwikPen; Inject 30 Units into the skin every evening. May substitute as needed per insurance.  Dispense: 15 mL; Refill: 2 -     Insulin  Lispro (1 Unit Dial); Inject 8 Units into the skin 3 (three) times daily.  Dispense: 15 mL; Refill: 2  HFrEF (heart failure with reduced ejection fraction) (HCC) Assessment & Plan: Mild fluid retention with recent 4-pound weight gain and lower extremity edema. Patient feels current Lasix  dose less effective than hospital regimen. - Administer 1 extra Lasix  dose today. - Schedule stat labs tomorrow for electrolyte check post-Lasix . - Consider compression stockings for edema. - Follow up with heart failure clinic as scheduled on Friday.  - Continue to monitor weight at home. - Seek immediate care for increased shortness of breath, difficulty breathing, or worsening edema.    Hypokalemia -     Basic metabolic panel with GFR   Charmaine Gillian Meeuwsen, PA-C

## 2024-02-21 NOTE — Patient Instructions (Addendum)
 Increase Basaglar  to 30 units in the evening  Short acting insulin  sliding scale with meals - 140-180= 4 units - 181- 220 = 6 units  - 221- 260= 8 units - 261- 300= 10 units - 301-350= 12 units  - 351- 400= 14 units - higher than 400 = 16 units   Lasik- one extra dose today 10/1 - Lab work tomorrow at the hospital lab

## 2024-02-21 NOTE — Assessment & Plan Note (Signed)
 Mild fluid retention with recent 4-pound weight gain and lower extremity edema. Patient feels current Lasix  dose less effective than hospital regimen. - Administer 1 extra Lasix  dose today. - Schedule stat labs tomorrow for electrolyte check post-Lasix . - Consider compression stockings for edema. - Follow up with heart failure clinic as scheduled on Friday.  - Continue to monitor weight at home. - Seek immediate care for increased shortness of breath, difficulty breathing, or worsening edema.

## 2024-02-21 NOTE — Telephone Encounter (Signed)
 Copied from CRM #[id number]. Topic: Clinical - Prescription Issue >> Feb 21, 2024 11:43 AM Emylou G wrote: Reason for CRM: [facility]  said rcvd insulin  request.. they need clarity on her profile before they can fill.. Pls call her back [phone]

## 2024-02-21 NOTE — Progress Notes (Signed)
 Complex Care Management Note  Care Guide Note 02/21/2024 Name: Christie Anderson MRN: [id number] DOB: 24-Nov-1975  Christie Anderson is a 48 y.o. year old female who sees Grooms, [place], [place] for primary care. I reached out to Christie Anderson by phone today to offer complex care management services.  Christie Anderson was given information about Complex Care Management services today including:   The Complex Care Management services include support from the care team which includes your Nurse Care Manager, Clinical Social Worker, or Pharmacist.  The Complex Care Management team is here to help remove barriers to the health concerns and goals most important to you. Complex Care Management services are voluntary, and the patient may decline or stop services at any time by request to their care team member.   Complex Care Management Consent Status: Patient agreed to services and verbal consent obtained.   Follow up plan:  Telephone appointment with complex care management team member scheduled for:  02/26/2024  Encounter Outcome:  Patient Scheduled  Christie Anderson , RMA     Christie Anderson  [facility], [facility] Guide  Direct Dial: [phone]  Website: [email or web address]

## 2024-02-22 ENCOUNTER — Other Ambulatory Visit (HOSPITAL_COMMUNITY)
Admission: RE | Admit: 2024-02-22 | Discharge: 2024-02-22 | Disposition: A | Discharge status: Home / Self Care | Payer: MEDICAID | Source: Ambulatory Visit | Attending: Physician Assistant | Admitting: Physician Assistant

## 2024-02-22 ENCOUNTER — Telehealth (HOSPITAL_COMMUNITY): Payer: Self-pay

## 2024-02-22 ENCOUNTER — Encounter (HOSPITAL_COMMUNITY): Payer: Self-pay

## 2024-02-22 DIAGNOSIS — E876 Hypokalemia: Secondary | ICD-10-CM | POA: Insufficient documentation

## 2024-02-22 DIAGNOSIS — E1165 Type 2 diabetes mellitus with hyperglycemia: Secondary | ICD-10-CM | POA: Insufficient documentation

## 2024-02-22 DIAGNOSIS — Z794 Long term (current) use of insulin: Secondary | ICD-10-CM | POA: Insufficient documentation

## 2024-02-22 LAB — BASIC METABOLIC PANEL WITH GFR
Anion gap: 9 (ref 5–15)
BUN: 21 mg/dL — ABNORMAL HIGH (ref 6–20)
CO2: 23 mmol/L (ref 22–32)
Calcium: 9 mg/dL (ref 8.9–10.3)
Chloride: 99 mmol/L (ref 98–111)
Creatinine, Ser: 0.57 mg/dL (ref 0.44–1.00)
GFR, Estimated: >60 mL/min (ref >60)
Glucose, Bld: 186 mg/dL — ABNORMAL HIGH (ref 70–99)
Potassium: 4.4 mmol/L (ref 3.5–5.1)
Sodium: 131 mmol/L — ABNORMAL LOW (ref 135–145)

## 2024-02-22 NOTE — Telephone Encounter (Signed)
 Pharmacy informed.

## 2024-02-22 NOTE — Telephone Encounter (Signed)
 Called to confirm/remind patient of their appointment at the [facility] on 02/23/24 3:00.   Appointment:   [x] Confirmed  [] Left mess   [] No answer/No voice mail  [] VM Full/unable to leave message  [] Phone not in service  Patient reminded to bring all medications and/or complete list.  Confirmed patient has transportation. Gave directions, instructed to utilize valet parking.

## 2024-02-22 NOTE — Progress Notes (Signed)
 ADVANCED HF CLINIC CONSULT NOTE  Referring Physician: Grooms, [place], PA-C Primary Care: Grooms, [place], [place] Primary Cardiologist: None  Chief Complaint: chronic HFrEF HPI: Christie Anderson is a 48 y.o. female with recently diagnosed HFrEF and history of tobacco abuse.   Admitted 9/25 with acute HFrEF. Echo showed LVEF 35-40% RV mildly reduced. Marked LVH with low volts on EKG. Massively volume overloaded on admission, had gone up ~50 lbs in 6 months. Overall diuresed 60lbs with lasix  gtt and diamox . MRI showed LVEF 32%, nonspecific pattern of delayed enhancement with midwall septal scar, seen in NiCM. No definite findings of cardiac amyloidosis. RVEF 39%. Underwent RHC + endomyocardial biopsy on day of discharge which showed severely reduced filling pressures and severely reduced cardiac index. Biopsy results pending at discharge. GDMT started.   Today she returns for post hospital follow up with her best friend who brought her to the appt. Overall feeling ok but tired and with decreased appetite. Denies palpitations, CP, edema, or PND/Orthopnea. Intt dizziness. SOB with activity. Sleeps on 2 pillows at home. No fever or chills. Weight at home 122>133 has been slowly increasing pounds. She tried to watch what she eats and she got rid of her salt shaker at home. Tries to drink fluid sparingly. Taking all medications. Denies ETOH or drug use. Trying to stop smoking cigarettes.    Past Medical History:  Diagnosis Date   Diabetes (HCC)     Current Outpatient Medications  Medication Sig Dispense Refill   Accu-Chek Softclix Lancets lancets Use 3 (three) times daily as directed to check blood sugar. 100 each 0   Blood Glucose Monitoring Suppl (BLOOD GLUCOSE MONITOR SYSTEM) w/Device KIT Use 3 (three) times daily. 1 kit 0   furosemide  (LASIX ) 40 MG tablet Take 1 tablet (40 mg total) by mouth daily. 30 tablet 0   Glucose Blood (BLOOD GLUCOSE TEST STRIPS) STRP Use 3 (three) times daily as  directed to check blood sugar. 100 strip 0   Insulin  Glargine (BASAGLAR  KWIKPEN) 100 UNIT/ML Inject 30 Units into the skin every evening. May substitute as needed per insurance. 15 mL 2   insulin  lispro (HUMALOG ) 100 UNIT/ML KwikPen Inject 8 Units into the skin 3 (three) times daily. 15 mL 2   Insulin  Pen Needle 32G X 4 MM MISC Use 3 (three) times daily. 100 each 0   Insulin  Syringe-Needle U-100 (INSULIN  SYRINGE .5CC/30GX5/16) 30G X 5/16 0.5 ML MISC Use one syringe and needle per insulin  injection. 100 each 1   Lancet Device MISC 1 each by Does not apply route 3 (three) times daily. May dispense any manufacturer covered by patient's insurance. 1 each 0   sacubitril -valsartan  (ENTRESTO ) 24-26 MG Take 1 tablet by mouth 2 (two) times daily. 60 tablet 0   spironolactone  (ALDACTONE ) 25 MG tablet Take 1 tablet (25 mg total) by mouth daily. 30 tablet 0   No current facility-administered medications for this encounter.    Allergies  Allergen Reactions   Beef-Derived Drug Products Hives and Swelling   Pork-Derived Products Hives and Swelling   Amoxicillin Other (See Comments)    Unknown    Doxycycline Nausea And Vomiting   Penicillins Other (See Comments)    Unknown     Social History   Socioeconomic History   Marital status: Single    Spouse name: Not on file   Number of children: 1   Years of education: Not on file   Highest education level: GED or equivalent  Occupational History   Occupation:  Unemployed  Tobacco Use   Smoking status: Every Day    Types: Cigarettes   Smokeless tobacco: Never  Substance and Sexual Activity   Alcohol use: Not on file   Drug use: Not on file   Sexual activity: Not on file  Other Topics Concern   Not on file  Social History Narrative   Not on file   Social Drivers of Health   Financial Resource Strain: Not on file  Food Insecurity: Food Insecurity Present (02/19/2024)   Hunger Vital Sign    Worried About Running Out of Food in the Last Year:  Sometimes true    Ran Out of Food in the Last Year: Sometimes true  Transportation Needs: No Transportation Needs (02/19/2024)   PRAPARE - Administrator, Civil Service (Medical): No    Lack of Transportation (Non-Medical): No  Physical Activity: Not on file  Stress: Not on file  Social Connections: Not on file  Intimate Partner Violence: Not At Risk (02/19/2024)   Humiliation, Afraid, Rape, and Kick questionnaire    Fear of Current or Ex-Partner: No    Emotionally Abused: No    Physically Abused: No    Sexually Abused: No    Family History  Problem Relation Age of Onset   Hypertension Mother    Stroke Mother    Stroke Paternal Grandmother     Vitals:   02/23/24 1525  BP: 122/78  Pulse: 90  SpO2: 97%  Weight: 62.4 kg (137 lb 9.6 oz)  Height: 5' 9 (1.753 m)    PHYSICAL EXAM: General:  chronically ill appearing.  No respiratory difficulty Neck: JVD ~7 cm.  Cor: Regular rate & rhythm. No murmurs. Lungs: clear, diminished bases Extremities: +1-2 BLE edema. +2-3 thigh edema. +1 abdominal edema.  Neuro: alert & oriented x 3. Affect pleasant.   Wt Readings from Last 3 Encounters:  02/23/24 62.4 kg (137 lb 9.6 oz)  02/21/24 58.1 kg (128 lb 0.6 oz)  02/19/24 55.5 kg (122 lb 6.4 oz)    ECG: none today.   ReDs reading: 34 %, normal   ASSESSMENT & PLAN: 1. Acute on chronic HFrEF - Echo 9/25 LVEF 35-40% RV mildly reduced. Marked LVH with low volts on EKG.  - ? Infiltrative disease. No previous cardiac diagnosis. Mom died from HF.  - EKG low voltage with poor anterior R wave progression consistent with underlying amyloid.  - RHC 9/25 with severely reduced filling pressures and severely reduced cardiac index - MMP no M spike observed. K/L ratio elevated at 1.85. Biopsy not consistent with cardiac amyloid - CMRI 9/25 with LVEF 32%, nonspecific pattern of delayed enhancement with midwall septal scar, seen in NiCM. No definite findings of cardiac amyloidosis. RVEF  39%. Mod pleural effusion.  - NYHA II-IIIa - Volume overloaded on exam (has chronic BLE edema) edema now up to thigh and some of her stomach. Weight up 15lbs from discharge. ReDS WNL but CVP was always low while admitted despite being massively volume overload. (Did not require milrinone while admitted) - Will increase lasix  40>60 mg daily (may need to switch to torsemide) - Will have her take diamox  500 mg daily for 2 days + extra 40 mEq KDUR with the diamox  (she responded very well to diamox  during admission).  - Continue spiro 25 mg daily  - Stop Entresto , start Losartan  50 mg daily. Entresto  no longer covered on HF fund.  - Jardiance  stopped during admission with A1c >10. (12.2 9/25) - Avoid BB while  volume overloaded  - Will need to consider advanced therapies in the near future.  - She ordered some compressions socks from [place] during the visit. I asked her to try to wear daily.  - Labs reviewed from yesterday K 4.4, SCr 0.57, Na 131, CO2 23. Plan to repeat labs in 1 week.    2. Uncontrolled DM  - Hgb A1C 14.  - Off SGLT2i with A1c >10 - Has been following up with PCP.   3. Tobacco Abuse - Has significantly cut back on how much she smokes. Only smoked one yesterday.  - Cessation advised.   Follow up in 1-2 weeks to reevaluate volume status.   HFSW to see today. Uninsured. Questions about disability. May need help with transportation.   Beckey Coe, AGACNP-BC   [facility] [address] [facility] [place] [address] [phone] (office)

## 2024-02-23 ENCOUNTER — Other Ambulatory Visit: Payer: Self-pay | Admitting: Physician Assistant

## 2024-02-23 ENCOUNTER — Ambulatory Visit: Payer: Self-pay | Admitting: Physician Assistant

## 2024-02-23 ENCOUNTER — Encounter (HOSPITAL_COMMUNITY): Payer: Self-pay

## 2024-02-23 ENCOUNTER — Other Ambulatory Visit (HOSPITAL_COMMUNITY): Payer: Self-pay

## 2024-02-23 ENCOUNTER — Ambulatory Visit (HOSPITAL_COMMUNITY)
Admit: 2024-02-23 | Discharge: 2024-02-23 | Disposition: A | Discharge status: Home / Self Care | Payer: MEDICAID | Source: Ambulatory Visit | Attending: Internal Medicine | Admitting: Internal Medicine

## 2024-02-23 ENCOUNTER — Encounter: Payer: Self-pay | Admitting: Physician Assistant

## 2024-02-23 VITALS — BP 122/78 | HR 90 | Ht 69.0 in | Wt 137.6 lb

## 2024-02-23 DIAGNOSIS — I5023 Acute on chronic systolic (congestive) heart failure: Secondary | ICD-10-CM | POA: Insufficient documentation

## 2024-02-23 DIAGNOSIS — I5042 Chronic combined systolic (congestive) and diastolic (congestive) heart failure: Secondary | ICD-10-CM | POA: Insufficient documentation

## 2024-02-23 DIAGNOSIS — E1165 Type 2 diabetes mellitus with hyperglycemia: Secondary | ICD-10-CM | POA: Insufficient documentation

## 2024-02-23 DIAGNOSIS — E119 Type 2 diabetes mellitus without complications: Secondary | ICD-10-CM

## 2024-02-23 DIAGNOSIS — Z794 Long term (current) use of insulin: Secondary | ICD-10-CM | POA: Insufficient documentation

## 2024-02-23 DIAGNOSIS — Z720 Tobacco use: Secondary | ICD-10-CM

## 2024-02-23 DIAGNOSIS — Z5971 Insufficient health insurance coverage: Secondary | ICD-10-CM | POA: Insufficient documentation

## 2024-02-23 DIAGNOSIS — F17210 Nicotine dependence, cigarettes, uncomplicated: Secondary | ICD-10-CM | POA: Insufficient documentation

## 2024-02-23 LAB — MICROALBUMIN / CREATININE URINE RATIO
Creatinine, Urine: 56.4 mg/dL
Microalb Creat Ratio: 6236 mg/g{creat} — ABNORMAL HIGH (ref 0–29)
Microalb, Ur: 3517 ug/mL — ABNORMAL HIGH

## 2024-02-23 MED ORDER — LOSARTAN POTASSIUM 50 MG PO TABS
50.0000 mg | ORAL_TABLET | Freq: Every day | ORAL | 2 refills | Status: DC
  Filled 2024-02-23 (×2): qty 30, 30d supply, fill #0

## 2024-02-23 MED ORDER — ACETAZOLAMIDE 250 MG PO TABS
500.0000 mg | ORAL_TABLET | Freq: Every day | ORAL | 0 refills | Status: DC
  Filled 2024-02-23: qty 4, 2d supply, fill #0

## 2024-02-23 MED ORDER — POTASSIUM CHLORIDE CRYS ER 20 MEQ PO TBCR
40.0000 meq | EXTENDED_RELEASE_TABLET | Freq: Every day | ORAL | 0 refills | Status: DC
Start: 2024-02-23 — End: 2024-03-07
  Filled 2024-02-23: qty 4, 2d supply, fill #0

## 2024-02-23 MED ORDER — SPIRONOLACTONE 25 MG PO TABS
25.0000 mg | ORAL_TABLET | Freq: Every day | ORAL | 2 refills | Status: DC
  Filled 2024-02-23: qty 30, 30d supply, fill #0

## 2024-02-23 MED ORDER — FUROSEMIDE 40 MG PO TABS
60.0000 mg | ORAL_TABLET | Freq: Every day | ORAL | 2 refills | Status: DC
  Filled 2024-02-23: qty 45, 30d supply, fill #0

## 2024-02-23 NOTE — Telephone Encounter (Signed)
 Received communication from [employer] that the Entresto  will no longer be available for patient assistance. Spoke to patient by phone and I have updated upcoming appointment for provider to discuss options for discount card use or alternate medication. Patient expressed understanding.

## 2024-02-23 NOTE — Progress Notes (Signed)
 ReDS Vest / Clip - 02/23/24 1525       ReDS Vest / Clip   Station Marker C    Ruler Value 29.5    ReDS Value Range Low volume    ReDS Actual Value 34

## 2024-02-23 NOTE — Addendum Note (Signed)
 Encounter addended by: Buell Powell HERO, RN on: 02/23/2024 5:17 PM  Actions taken: Diagnosis association updated, Order list changed, Child order released for a procedure order

## 2024-02-23 NOTE — Progress Notes (Signed)
 H&V Care Navigation CSW Progress Note  Clinical Social Worker consulted to speak with pt regarding lack of insurance.  States she can't afford ACA.  Thinks she has a visit Monday regarding Medicaid- provided her with assigned [employer] workers number to follow up regarding screening.  States she gets $2000/month from spouses pension which would put her over Medicaid limit- encouraged her to inquire about deductible Medicaid.  Provided CAFA if Medicaid is not an option.  Pt applying for food stamps.  Pt applied for disability about a month ago- understands this will take 6-9 months to get answer.   Thinks she will lose her spouses pension starting next year- unsure how she will manage moving forward.  Lives with her son at this time who works.   SDOH Screenings   Food Insecurity: Food Insecurity Present (02/19/2024)  Housing: Unknown (02/19/2024)  Transportation Needs: No Transportation Needs (02/19/2024)  Utilities: Not At Risk (02/19/2024)  Depression (PHQ2-9): Medium Risk (02/21/2024)  Tobacco Use: High Risk (02/23/2024)   Christie HILARIO Leech, LCSW Clinical Social Worker [facility] Desk#: [phone] Cell#: [phone]

## 2024-02-23 NOTE — Patient Instructions (Signed)
 Medication Changes:  Take Diomox 500 mg (2 tabs) Daily for 2 days only  Take Potassium 40 meq (2 tabs) Daily for 2 days only  INCREASE Furosemide  to 60 mg (1 & 1/2 tabs) Daily  Lab Work:  Labs done today, your results will be available in MyChart, we will contact you for abnormal readings.  Special Instructions // Education:  Do the following things EVERYDAY: Weigh yourself in the morning before breakfast. Write it down and keep it in a log. Take your medicines as prescribed Eat low salt foods--Limit salt (sodium) to 2000 mg per day.  Stay as active as you can everyday Limit all fluids for the day to less than 2 liters  Please wear your compression hose daily, place them on as soon as you get up in the morning and remove before you go to bed at night.   Follow-Up in: 1-2 weeks   At the [facility], you and your health needs are our priority. We have a designated team specialized in the treatment of Heart Failure. This Care Team includes your primary Heart Failure Specialized Cardiologist (physician), Advanced Practice Providers (APPs- Physician Assistants and Nurse Practitioners), and Pharmacist who all work together to provide you with the care you need, when you need it.   You may see any of the following providers on your designated Care Team at your next follow up:  Dr. Toribio Fuel Dr. Ezra Shuck Dr. Ria Commander Dr. Odis Brownie Greig Mosses, NP Caffie Shed, [place] [facility], [place] Beckey Coe, NP [place] Lee, NP Tinnie Redman, PharmD   Please be sure to bring in all your medications bottles to every appointment.   Need to Contact Us :  If you have any questions or concerns before your next appointment please send us  a message through [place] or call our office at [phone].    TO LEAVE A MESSAGE FOR THE NURSE SELECT OPTION 2, PLEASE LEAVE A MESSAGE INCLUDING: YOUR NAME DATE OF BIRTH CALL BACK NUMBER REASON FOR  CALL**this is important as we prioritize the call backs  YOU WILL RECEIVE A CALL BACK THE SAME DAY AS LONG AS YOU CALL BEFORE 4:00 PM

## 2024-02-26 ENCOUNTER — Telehealth: Payer: Self-pay | Admitting: *Deleted

## 2024-02-26 ENCOUNTER — Other Ambulatory Visit: Payer: Self-pay | Admitting: Physician Assistant

## 2024-02-26 ENCOUNTER — Other Ambulatory Visit: Payer: Self-pay

## 2024-02-26 DIAGNOSIS — R809 Proteinuria, unspecified: Secondary | ICD-10-CM

## 2024-02-26 DIAGNOSIS — Z794 Long term (current) use of insulin: Secondary | ICD-10-CM

## 2024-02-26 MED ORDER — BASAGLAR KWIKPEN 100 UNIT/ML ~~LOC~~ SOPN: 30.0000 [IU] | PEN_INJECTOR | Freq: Every evening | SUBCUTANEOUS | 2 refills | Status: DC

## 2024-02-26 NOTE — Telephone Encounter (Signed)
 Copied from CRM #[id number]. Topic: Clinical - Prescription Issue >> Feb 26, 2024 10:11 AM Sophia H wrote: Reason for CRM: [place] [facility] states last week she was told by [facility] Insulin  Glargine (BASAGLAR  [facility]) 100 UNIT/ML is what the patient should be taking and then received an RX today for Lantus  with different instructions. Needing to clarify as the patient did already pick up the Basaglar .     Can speak with anyone but preferable ask for [place] - [phone]

## 2024-02-26 NOTE — Patient Outreach (Signed)
 Complex Care Management   Visit Note  02/26/2024  Name:  Christie Anderson MRN: [id number] DOB: November 29, 1975  Situation: Referral received for Complex Care Management related to SDOH Barriers:  Food insecurity Financial Resource Strain I obtained verbal consent from Patient.  Visit completed with Patient  on the phone  Background:   Past Medical History:  Diagnosis Date   Diabetes [facility])     Assessment:  Patient and 49 year old son live together. Patients pension ends 12/25. Patient has applied for disability, foodstamps and medicaid and is waiting on reply. The home is paid for and the son can manage the utilities. SW will email patient a list of food banks and contact number for DSS to follow up on the application. SW also suggested patient apply for the [employer]/Energy assistance program in December and will include the information in the email. Patient declines a follow up.   SDOH Interventions    Flowsheet Row Patient Outreach Telephone from 02/26/2024 in [place] POPULATION HEALTH DEPARTMENT Telephone from 02/19/2024 in [place] POPULATION HEALTH DEPARTMENT  SDOH Interventions    Food Insecurity Interventions -- AMB Referral  [referral to care guide]  Housing Interventions -- Intervention Not Indicated  Transportation Interventions -- Intervention Not Indicated  Utilities Interventions -- Intervention Not Indicated  Financial Strain Interventions Intervention Not Indicated --    Recommendation:   None  Follow Up Plan:   Patient has met all care management goals. Care Management case will be closed. Patient has been provided contact information should new needs arise.   Tillman Gardener, BSW [place]  [facility], [facility] Social Worker Direct Dial: [phone]  Fax: [phone] Website: [email or web address]

## 2024-02-26 NOTE — Telephone Encounter (Signed)
 [facility] is requesting a new script with clarified dosing- states Basaglar  was for 30 units but the Lanus was for 15 units- they want a brand new script sent over for the correct insulin  and correct dosing

## 2024-02-26 NOTE — Patient Instructions (Signed)
 Visit Information  Thank you for taking time to visit with me today. Please don't hesitate to contact me if I can be of assistance to you before our next scheduled appointment.    Following is a copy of your care plan:   Goals Addressed             This Visit's Progress    BSW VBCI Social Work Care Plan       Problems:   Air traffic [employer] Insecurity   CSW Clinical Goal(s):   Over the next 30  days the Patient will will follow up with Department of Social Services and food banks as directed by Social Work.  Interventions:  Social Determinants of Health in Patient with CHF, DMII, and HTN: SDOH assessments completed: Financial Strain  and Food Insecurity  Evaluation of current treatment plan related to unmet needs Patient and 50 year old son lives together.  Patients pension ends 12/25.  Patient has applied for disability, foodstamps and medicaid and is waiting on reply.  The home is paid for and the son can manage the utilities.  SW will email patient a list of food banks and contact number for DSS to follow up on the application.  SW also suggested patient apply for the [employer] assistance program in December and will include the information in the email.  Patient declines a follow up.  Patient Goals/Self-Care Activities:  Coordinate with DSS and food banks to assist with benefits and food.  Plan:   No further follow up required: Patient declined follow up.        Please call 911 if you are experiencing a Mental Health or Behavioral Health Crisis or need someone to talk to.  Patient verbalizes understanding of instructions and care plan provided today and agrees to view in MyChart. Active MyChart status and patient understanding of how to access instructions and care plan via MyChart confirmed with patient.     Tillman Gardener, BSW [place]  [facility], [facility] Social Worker Direct Dial: [phone]  Fax:  [phone] Website: [email or web address]

## 2024-02-27 ENCOUNTER — Other Ambulatory Visit: Payer: Self-pay | Admitting: *Deleted

## 2024-02-27 NOTE — Patient Outreach (Signed)
 Transition of Care week 2  Visit Note  02/27/2024  Name: Christie Anderson MRN: [id number]          DOB: 1976-03-04  Situation: Patient enrolled in [facility] 30-day program. Visit completed with patient by telephone.   Background:    Past Medical History:  Diagnosis Date   Diabetes (HCC)     Assessment: Patient Reported Symptoms: Cognitive Cognitive Status: No symptoms reported, Alert and oriented to person, place, and time, Normal speech and language skills, Able to follow simple commands      Neurological Neurological Review of Symptoms: No symptoms reported    HEENT HEENT Symptoms Reported: No symptoms reported      Cardiovascular Cardiovascular Symptoms Reported: Swelling in legs or feet (pt states if she does not elevate legs throughout the day she has swelling, cardiologist is aware, pt diuretic as prescribed) Does patient have uncontrolled Hypertension?: No Cardiovascular Management Strategies: Adequate rest, Medication therapy Weight: 135 lb (61.2 kg) Cardiovascular Self-Management Outcome: 3 (uncertain) Cardiovascular Comment: HF- reinforced heart failure action plan- pt is weighing daily  Respiratory Respiratory Symptoms Reported: No symptoms reported    Endocrine Endocrine Symptoms Reported: Hyperglycemia Is patient diabetic?: Yes Is patient checking blood sugars at home?: Yes List most recent blood sugar readings, include date and time of day: checking CBG at least twice daily Endocrine Self-Management Outcome: 3 (uncertain) Endocrine Comment: CBG this morning is 440, pt has not taken any medication yet, pt states she forgot about increaseing her basaglar  to 30 units, states she will start today, pt states she is using Lispro 4-16 units based on CBG, pt is elevating her legs and does not have the scale in front of her to read to [facility], had one reading of 70.  In basket message sent to primary care provider reporting today's CBG.  Gastrointestinal Gastrointestinal Symptoms  Reported: No symptoms reported      Genitourinary Genitourinary Symptoms Reported: No symptoms reported    Integumentary Integumentary Symptoms Reported: No symptoms reported    Musculoskeletal Musculoskelatal Symptoms Reviewed: Limited mobility Additional Musculoskeletal Details: cane and walker Musculoskeletal Management Strategies: Adequate rest, Routine screening Musculoskeletal Self-Management Outcome: 4 (good) Musculoskeletal Comment: reinforced safety precautions      Psychosocial Psychosocial Symptoms Reported: No symptoms reported         There were no vitals filed for this visit.  Medications Reviewed Today   Medications were not reviewed in this encounter     Goals Addressed             This Visit's Progress    VBCI Transitions of Care (TOC) Care Plan       Problems:  Recent Hospitalization for treatment of CHF- new onset, pt is weighing daily, using walker and cane  Diet/Nutrition/Food Resources pt reports she is in process of applying for food stamps and being placed on waiting list for meals on wheels and SDOH barrier: care guide referral placed for food insecurity Patient states she called primary care provider, reported CBG readings for today 455, 523, instructed to go ED, pt states she is not going,  CBG now is 447, RN CM instructed pt to go to ED and she declined, states  if not better by tomorrow, I might go 02/27/24- pt reports she is sitting with legs elevated due to has  some swelling everyday pt saw cardiologist and primary care provider, pt states she forgot basaglar  was increased to 30 units and has not been doing this, will start today, had one blood sugar  at 70, today's fasting is 440, has not taken medication yet, cannot read off sliding scale to RN CM due to does not have in front of her, pt will not go to ED for elevated CBG, weight today 135 pounds, pt is in touch as needed with HF clinic, to follow up 10/21 and reports she has video visit in a  few days.  Goal:  Over the next 30 days, the patient will not experience hospital readmission  Interventions:  Heart Failure Interventions: Reviewed Heart failure action plan Reviewed importance of weighing daily on hard surface, first thing in the morning, after emptying bladder and before eating, keeping a log Reviewed low sodium diet Reviewed upcoming scheduled appointments including HF clinic 02/22/24, primary care provider 02/21/24 Reviewed medications and importance of taking as prescribed Reviewed safety precautions Reviewed importance of small, frequent meals with adequate protein  Diabetes Interventions: Assessed patient's understanding of A1c goal: <7% Reviewed medications with patient and discussed importance of medication adherence Counseled on importance of regular laboratory monitoring as prescribed Discussed plans with patient for ongoing care management follow up and provided patient with direct contact information for care management team Advised patient, providing education and rationale, to check cbg per doctor's order and record, calling primary care provider for findings outside established parameters Review of patient status, including review of consultants reports, relevant laboratory and other test results, and medications completed Reviewed carbohydrate modified diet, avoiding concentrated sweets Reviewed signs /symptoms hypo and hyperglycemia, action plan, encouraged pt to have snacks in her purse, car and readily available at home in case of hypoglycemia In basket message sent and to primary care provider reporting CBG this morning is 440, pt has not taken medication and pt forgot to increase basaglar  to 30 units, pt declines to go to ED for elevated CBG Lab Results  Component Value Date   HGBA1C 12.6 (H) 02/13/2024    Patient Self Care Activities:  Attend all scheduled provider appointments Call pharmacy for medication refills 3-7 days in advance of running  out of medications Call provider office for new concerns or questions  Notify RN Care Manager of TOC call rescheduling needs Participate in Transition of Care Program/Attend TOC scheduled calls Take medications as prescribed   call office if I gain more than 2 pounds in one day or 5 pounds in one week track weight in diary use salt in moderation watch for swelling in feet, ankles and legs every day weigh myself daily develop a rescue plan follow rescue plan if symptoms flare-up eat more whole grains, fruits and vegetables, lean meats and healthy fats check blood sugar at prescribed times: per doctor's order check feet daily for cuts, sores or redness enter blood sugar readings and medication or insulin  into daily log take the blood sugar log to all doctor visits trim toenails straight across fill half of plate with vegetables manage portion size set a realistic goal wash and dry feet carefully every day wear comfortable, cotton socks Basaglar  has been increased to 30 units Follow RULE OF 15 for low blood sugar management:  How to treat low blood sugars (Blood sugar less than 70 mg/dl  Please follow the RULE OF 15 for the treatment of hypoglycemia treatment (When your blood sugars are less than 70 mg/ dl) STEP  1:  Take 15 grams of carbohydrates when your blood sugar is low, which includes One tube of glucose gel STEP 2:  Recheck blood sugar in 15 minutes STEP 3:  3-4 glucose tabs or  3-4 oz of juice or regular soda or If your blood sugar is still low at the 15 minute recheck ---then, go back to STEP 1 and treat again with another 15 grams of carbohydrates  Plan:  Telephone follow up appointment with care management team member scheduled for:  03/05/24 @ 1045 am The patient has been provided with contact information for the care management team and has been advised to call with any health related questions or concerns.          Recommendation:   PCP Follow-up Specialty  provider follow-up Heart Failure clinic  Follow Up Plan:   Telephone follow-up 03/05/24 @ 1045 am  Mliss Creed [facility], BSN RN Care Manager/ Transition of Care [place]/ [facility] Population Health [phone]

## 2024-02-27 NOTE — Patient Instructions (Signed)
 Visit Information  Thank you for taking time to visit with me today. Please don't hesitate to contact me if I can be of assistance to you before our next scheduled telephone appointment.  Our next appointment is by telephone on 03/05/24 @ 1045 am  Following is a copy of your care plan:   Goals Addressed             This Visit's Progress    VBCI Transitions of Care (TOC) Care Plan       Problems:  Recent Hospitalization for treatment of CHF- new onset, pt is weighing daily, using walker and cane  Diet/Nutrition/Food Resources pt reports she is in process of applying for food stamps and being placed on waiting list for meals on wheels and SDOH barrier: care guide referral placed for food insecurity Patient states she called primary care provider, reported CBG readings for today 455, 523, instructed to go ED, pt states she is not going,  CBG now is 447, RN CM instructed pt to go to ED and she declined, states  if not better by tomorrow, I might go 02/27/24- pt reports she is sitting with legs elevated due to has  some swelling everyday pt saw cardiologist and primary care provider, pt states she forgot basaglar  was increased to 30 units and has not been doing this, will start today, had one blood sugar at 70, today's fasting is 440, has not taken medication yet, cannot read off sliding scale to RN CM due to does not have in front of her, pt will not go to ED for elevated CBG, weight today 135 pounds, pt is in touch as needed with HF clinic, to follow up 10/21 and reports she has video visit in a few days.  Goal:  Over the next 30 days, the patient will not experience hospital readmission  Interventions:  Heart Failure Interventions: Reviewed Heart failure action plan Reviewed importance of weighing daily on hard surface, first thing in the morning, after emptying bladder and before eating, keeping a log Reviewed low sodium diet Reviewed upcoming scheduled appointments including HF clinic  02/22/24, primary care provider 02/21/24 Reviewed medications and importance of taking as prescribed Reviewed safety precautions Reviewed importance of small, frequent meals with adequate protein  Diabetes Interventions: Assessed patient's understanding of A1c goal: <7% Reviewed medications with patient and discussed importance of medication adherence Counseled on importance of regular laboratory monitoring as prescribed Discussed plans with patient for ongoing care management follow up and provided patient with direct contact information for care management team Advised patient, providing education and rationale, to check cbg per doctor's order and record, calling primary care provider for findings outside established parameters Review of patient status, including review of consultants reports, relevant laboratory and other test results, and medications completed Reviewed carbohydrate modified diet, avoiding concentrated sweets Reviewed signs /symptoms hypo and hyperglycemia, action plan, encouraged pt to have snacks in her purse, car and readily available at home in case of hypoglycemia In basket message sent and to primary care provider reporting CBG this morning is 440, pt has not taken medication and pt forgot to increase basaglar  to 30 units, pt declines to go to ED for elevated CBG Lab Results  Component Value Date   HGBA1C 12.6 (H) 02/13/2024    Patient Self Care Activities:  Attend all scheduled provider appointments Call pharmacy for medication refills 3-7 days in advance of running out of medications Call provider office for new concerns or questions  Notify RN Care Manager  of TOC call rescheduling needs Participate in Transition of Care Program/Attend TOC scheduled calls Take medications as prescribed   call office if I gain more than 2 pounds in one day or 5 pounds in one week track weight in diary use salt in moderation watch for swelling in feet, ankles and legs every  day weigh myself daily develop a rescue plan follow rescue plan if symptoms flare-up eat more whole grains, fruits and vegetables, lean meats and healthy fats check blood sugar at prescribed times: per doctor's order check feet daily for cuts, sores or redness enter blood sugar readings and medication or insulin  into daily log take the blood sugar log to all doctor visits trim toenails straight across fill half of plate with vegetables manage portion size set a realistic goal wash and dry feet carefully every day wear comfortable, cotton socks Basaglar  has been increased to 30 units Follow RULE OF 15 for low blood sugar management:  How to treat low blood sugars (Blood sugar less than 70 mg/dl  Please follow the RULE OF 15 for the treatment of hypoglycemia treatment (When your blood sugars are less than 70 mg/ dl) STEP  1:  Take 15 grams of carbohydrates when your blood sugar is low, which includes One tube of glucose gel STEP 2:  Recheck blood sugar in 15 minutes STEP 3:  3-4 glucose tabs or  3-4 oz of juice or regular soda or If your blood sugar is still low at the 15 minute recheck ---then, go back to STEP 1 and treat again with another 15 grams of carbohydrates  Plan:  Telephone follow up appointment with care management team member scheduled for:  03/05/24 @ 1045 am The patient has been provided with contact information for the care management team and has been advised to call with any health related questions or concerns.         Patient verbalizes understanding of instructions and care plan provided today and agrees to view in MyChart. Active MyChart status and patient understanding of how to access instructions and care plan via MyChart confirmed with patient.     Telephone follow up appointment with care management team member scheduled for: 03/05/24 @ 1045 am  Please call the care guide team at [phone] if you need to cancel or reschedule your appointment.   Please  call the Suicide and Crisis Lifeline: 988 call the [place]  National Suicide Prevention Lifeline: (347)201-8830 or TTY: 619 586 9386 TTY 865-229-3688) to talk to a trained counselor call 1-800-273-TALK (toll free, 24 hour hotline) go to [facility] [address], [place] [phone]) call the [facility] Crisis Line: 820-002-2330 call 911 if you are experiencing a Mental Health or Behavioral Health Crisis or need someone to talk to.  Mliss Creed [facility], BSN RN Care Manager/ Transition of Care / [facility] [phone]

## 2024-03-03 ENCOUNTER — Other Ambulatory Visit: Payer: Self-pay

## 2024-03-03 ENCOUNTER — Emergency Department (HOSPITAL_COMMUNITY): Payer: MEDICAID

## 2024-03-03 ENCOUNTER — Telehealth: Payer: Self-pay | Admitting: Cardiology

## 2024-03-03 ENCOUNTER — Encounter (HOSPITAL_COMMUNITY): Payer: Self-pay | Admitting: *Deleted

## 2024-03-03 ENCOUNTER — Inpatient Hospital Stay (HOSPITAL_COMMUNITY)
Admission: EM | Admit: 2024-03-03 | Discharge: 2024-03-07 | DRG: 291 | Disposition: A | Discharge status: Home / Self Care | Payer: MEDICAID | Attending: Family Medicine | Admitting: Family Medicine

## 2024-03-03 DIAGNOSIS — Z59868 Other specified financial insecurity: Secondary | ICD-10-CM

## 2024-03-03 DIAGNOSIS — Z8249 Family history of ischemic heart disease and other diseases of the circulatory system: Secondary | ICD-10-CM

## 2024-03-03 DIAGNOSIS — I5023 Acute on chronic systolic (congestive) heart failure: Secondary | ICD-10-CM

## 2024-03-03 DIAGNOSIS — Z79899 Other long term (current) drug therapy: Secondary | ICD-10-CM

## 2024-03-03 DIAGNOSIS — Z881 Allergy status to other antibiotic agents status: Secondary | ICD-10-CM

## 2024-03-03 DIAGNOSIS — E1165 Type 2 diabetes mellitus with hyperglycemia: Secondary | ICD-10-CM | POA: Diagnosis present

## 2024-03-03 DIAGNOSIS — F17210 Nicotine dependence, cigarettes, uncomplicated: Secondary | ICD-10-CM | POA: Diagnosis present

## 2024-03-03 DIAGNOSIS — I509 Heart failure, unspecified: Principal | ICD-10-CM

## 2024-03-03 DIAGNOSIS — I110 Hypertensive heart disease with heart failure: Principal | ICD-10-CM | POA: Diagnosis present

## 2024-03-03 DIAGNOSIS — Z5948 Other specified lack of adequate food: Secondary | ICD-10-CM

## 2024-03-03 DIAGNOSIS — I5042 Chronic combined systolic (congestive) and diastolic (congestive) heart failure: Secondary | ICD-10-CM

## 2024-03-03 DIAGNOSIS — Z91014 Allergy to mammalian meats: Secondary | ICD-10-CM

## 2024-03-03 DIAGNOSIS — I10 Essential (primary) hypertension: Secondary | ICD-10-CM | POA: Diagnosis present

## 2024-03-03 DIAGNOSIS — Z880 Allergy status to penicillin: Secondary | ICD-10-CM

## 2024-03-03 DIAGNOSIS — E871 Hypo-osmolality and hyponatremia: Secondary | ICD-10-CM | POA: Diagnosis present

## 2024-03-03 DIAGNOSIS — Z560 Unemployment, unspecified: Secondary | ICD-10-CM

## 2024-03-03 DIAGNOSIS — I5043 Acute on chronic combined systolic (congestive) and diastolic (congestive) heart failure: Secondary | ICD-10-CM | POA: Diagnosis present

## 2024-03-03 DIAGNOSIS — Z5941 Food insecurity: Secondary | ICD-10-CM

## 2024-03-03 DIAGNOSIS — Z794 Long term (current) use of insulin: Secondary | ICD-10-CM

## 2024-03-03 HISTORY — DX: Heart failure, unspecified: I50.9

## 2024-03-03 LAB — CBC WITH DIFFERENTIAL/PLATELET
Abs Immature Granulocytes: 0.05 K/uL (ref 0.00–0.07)
Basophils Absolute: 0.1 K/uL (ref 0.0–0.1)
Basophils Relative: 1 %
Eosinophils Absolute: 0.1 K/uL (ref 0.0–0.5)
Eosinophils Relative: 1 %
HCT: 37.5 % (ref 36.0–46.0)
Hemoglobin: 12.3 g/dL (ref 12.0–15.0)
Immature Granulocytes: 1 %
Lymphocytes Relative: 21 %
Lymphs Abs: 2.2 K/uL (ref 0.7–4.0)
MCH: 31.1 pg (ref 26.0–34.0)
MCHC: 32.8 g/dL (ref 30.0–36.0)
MCV: 94.9 fL (ref 80.0–100.0)
Monocytes Absolute: 0.9 K/uL (ref 0.1–1.0)
Monocytes Relative: 8 %
Neutro Abs: 7.6 K/uL (ref 1.7–7.7)
Neutrophils Relative %: 68 %
Platelets: 375 K/uL (ref 150–400)
RBC: 3.95 MIL/uL (ref 3.87–5.11)
RDW: 12.5 % (ref 11.5–15.5)
WBC: 10.9 K/uL — ABNORMAL HIGH (ref 4.0–10.5)
nRBC: 0 % (ref 0.0–0.2)

## 2024-03-03 LAB — COMPREHENSIVE METABOLIC PANEL WITH GFR
ALT: 44 U/L (ref 0–44)
AST: 38 U/L (ref 15–41)
Albumin: 3.2 g/dL — ABNORMAL LOW (ref 3.5–5.0)
Alkaline Phosphatase: 116 U/L (ref 38–126)
Anion gap: 11 (ref 5–15)
BUN: 16 mg/dL (ref 6–20)
CO2: 25 mmol/L (ref 22–32)
Calcium: 8.6 mg/dL — ABNORMAL LOW (ref 8.9–10.3)
Chloride: 103 mmol/L (ref 98–111)
Creatinine, Ser: 0.72 mg/dL (ref 0.44–1.00)
GFR, Estimated: >60 mL/min (ref >60)
Glucose, Bld: 115 mg/dL — ABNORMAL HIGH (ref 70–99)
Potassium: 4.6 mmol/L (ref 3.5–5.1)
Sodium: 138 mmol/L (ref 135–145)
Total Bilirubin: <0.2 mg/dL (ref 0.0–1.2)
Total Protein: 6.6 g/dL (ref 6.5–8.1)

## 2024-03-03 LAB — TROPONIN T, HIGH SENSITIVITY
Troponin T High Sensitivity: 28 ng/L — ABNORMAL HIGH (ref 0–19)
Troponin T High Sensitivity: 28 ng/L — ABNORMAL HIGH (ref 0–19)

## 2024-03-03 LAB — GLUCOSE, CAPILLARY
Glucose-Capillary: 62 mg/dL — ABNORMAL LOW (ref 70–99)
Glucose-Capillary: 141 mg/dL — ABNORMAL HIGH (ref 70–99)
Glucose-Capillary: 182 mg/dL — ABNORMAL HIGH (ref 70–99)

## 2024-03-03 LAB — MAGNESIUM: Magnesium: 2.4 mg/dL (ref 1.7–2.4)

## 2024-03-03 LAB — PRO BRAIN NATRIURETIC PEPTIDE: Pro Brain Natriuretic Peptide: 4538 pg/mL — ABNORMAL HIGH (ref <300.0)

## 2024-03-03 MED ORDER — ENOXAPARIN SODIUM 40 MG/0.4ML IJ SOSY
40.0000 mg | PREFILLED_SYRINGE | Freq: Every day | INTRAMUSCULAR | Status: DC
  Administered 2024-03-03 – 2024-03-06 (×4): 40 mg via SUBCUTANEOUS
  Filled 2024-03-03 (×4): qty 0.4

## 2024-03-03 MED ORDER — FUROSEMIDE 10 MG/ML IJ SOLN
40.0000 mg | INTRAMUSCULAR | Status: AC
  Administered 2024-03-03: 40 mg via INTRAVENOUS
  Filled 2024-03-03: qty 4

## 2024-03-03 MED ORDER — SPIRONOLACTONE 25 MG PO TABS
25.0000 mg | ORAL_TABLET | Freq: Every day | ORAL | Status: DC
  Administered 2024-03-03 – 2024-03-07 (×5): 25 mg via ORAL
  Filled 2024-03-03 (×5): qty 1

## 2024-03-03 MED ORDER — ONDANSETRON HCL 4 MG PO TABS: 4.0000 mg | ORAL_TABLET | Freq: Four times a day (QID) | ORAL | Status: DC | PRN

## 2024-03-03 MED ORDER — INSULIN ASPART 100 UNIT/ML IJ SOLN
0.0000 [IU] | Freq: Three times a day (TID) | INTRAMUSCULAR | Status: DC
  Administered 2024-03-04: 8 [IU] via SUBCUTANEOUS
  Administered 2024-03-04 (×2): 2 [IU] via SUBCUTANEOUS

## 2024-03-03 MED ORDER — POLYETHYLENE GLYCOL 3350 17 G PO PACK: 17.0000 g | PACK | Freq: Every day | ORAL | Status: DC | PRN

## 2024-03-03 MED ORDER — INSULIN ASPART 100 UNIT/ML IJ SOLN
0.0000 [IU] | Freq: Every day | INTRAMUSCULAR | Status: DC
  Administered 2024-03-05: 2 [IU] via SUBCUTANEOUS

## 2024-03-03 MED ORDER — ONDANSETRON HCL 4 MG/2ML IJ SOLN: 4.0000 mg | Freq: Four times a day (QID) | INTRAMUSCULAR | Status: DC | PRN

## 2024-03-03 MED ORDER — INSULIN GLARGINE 100 UNIT/ML ~~LOC~~ SOLN
20.0000 [IU] | Freq: Every evening | SUBCUTANEOUS | Status: DC
  Administered 2024-03-04: 20 [IU] via SUBCUTANEOUS
  Filled 2024-03-03 (×2): qty 0.2

## 2024-03-03 MED ORDER — FUROSEMIDE 10 MG/ML IJ SOLN
40.0000 mg | Freq: Two times a day (BID) | INTRAMUSCULAR | Status: DC
  Administered 2024-03-04: 40 mg via INTRAVENOUS
  Filled 2024-03-03: qty 4

## 2024-03-03 MED ORDER — ACETAMINOPHEN 325 MG PO TABS: 650.0000 mg | ORAL_TABLET | Freq: Four times a day (QID) | ORAL | Status: DC | PRN

## 2024-03-03 MED ORDER — BASAGLAR KWIKPEN 100 UNIT/ML ~~LOC~~ SOPN: 20.0000 [IU] | PEN_INJECTOR | Freq: Every evening | SUBCUTANEOUS | Status: DC

## 2024-03-03 MED ORDER — ACETAMINOPHEN 650 MG RE SUPP: 650.0000 mg | Freq: Four times a day (QID) | RECTAL | Status: DC | PRN

## 2024-03-03 MED ORDER — DEXTROSE 50 % IV SOLN
INTRAVENOUS | Status: AC
  Filled 2024-03-03: qty 50

## 2024-03-03 MED ORDER — LOSARTAN POTASSIUM 50 MG PO TABS
50.0000 mg | ORAL_TABLET | Freq: Every day | ORAL | Status: DC
Start: 2024-03-04 — End: 2024-03-07
  Administered 2024-03-04 – 2024-03-07 (×4): 50 mg via ORAL
  Filled 2024-03-03 (×4): qty 1

## 2024-03-03 MED ORDER — DEXTROSE 50 % IV SOLN
25.0000 mL | Freq: Once | INTRAVENOUS | Status: DC
  Filled 2024-03-03: qty 50

## 2024-03-03 NOTE — ED Provider Notes (Signed)
 [facility] AT [facility] Provider Note   CSN: [id number] Arrival date & time: 03/03/24  1529     Patient presents with: Edema   Christie Anderson is a 48 y.o. female.   HPI   This patient is a 48 year old female, she is a chronic smoker, she had been admitted to the hospital February 09, 2024, she reported about a month of feeling short of breath and becoming progressively more swollen.  She does carry a prior history of diabetes and is followed at [facility] primary care for that on insulin .  When she was admitted to the hospital she had a BNP of 1135, a metabolic panel that showed hyponatremia with a sodium of 130 but normal renal function, she had a very low albumin at 2.4, flat troponins, nonreactive HIV, she was screened for C. difficile and this was negative, a GI panel for diarrhea was negative, and A1c was 12.6  Initial echocardiogram was done on September 20 and showed that the patient had an ejection fraction of 35 to 40%, there was grade 1 diastolic dysfunction, global hypokinesis of the left ventricle and moderate concentric LVH there was thought to be an infiltrative cardiomyopathy secondary to low voltage on the EKG with marked LVH  MRI revealed no definite findings of cardiac amyloidosis, on 26 September she had a right heart catheterization with an endomyocardial biopsy, the cells of this showed benign myocardium showing mild nonspecific cardial myocyte hyperplasia, it was negative for inflammation or other infiltrative process and there was no signs of amyloidosis.  The patient was discharged on 26 September.,  She was started on furosemide  and is currently taking it twice a day, dose of 40 mg, she was also started on spironolactone  25 mg  Despite these medications the patient is noted an approximate 25 pound weight gain since discharge, she is not short of breath at all and is not having any orthopnea but has had some increasing swelling of  her legs all the way up to her abdomen despite taking her medications at home    Prior to Admission medications   Medication Sig Start Date End Date Taking? Authorizing Provider  Accu-Chek Softclix Lancets lancets Use 3 (three) times daily as directed to check blood sugar. 02/16/24   Fairy Frames, MD  acetaZOLAMIDE  (DIAMOX ) 250 MG tablet Take 2 tablets (500 mg total) by mouth daily for 2 days. 02/23/24 02/25/24  Hayes Beckey CROME, NP  Blood Glucose Monitoring Suppl (BLOOD GLUCOSE MONITOR SYSTEM) w/Device KIT Use 3 (three) times daily. 02/16/24   Fairy Frames, MD  furosemide  (LASIX ) 40 MG tablet Take 1.5 tablets (60 mg total) by mouth daily. 02/23/24   Hayes Beckey CROME, NP  Glucose Blood (BLOOD GLUCOSE TEST STRIPS) STRP Use 3 (three) times daily as directed to check blood sugar. 02/16/24   Fairy Frames, MD  Insulin  Glargine (BASAGLAR  [facility]) 100 UNIT/ML Inject 30 Units into the skin every evening. May substitute as needed per insurance. 02/26/24   Grooms, Courtney, PA-C  insulin  lispro (HUMALOG ) 100 UNIT/ML KwikPen Inject 8 Units into the skin 3 (three) times daily. Patient taking differently: Inject 8 Units into the skin 3 (three) times daily. Pt is taking 4-16 units based on blood sugar (sliding scale) pt does not have scale in front of her to read scale 02/21/24   Grooms, [place], PA-C  Insulin  Pen Needle 32G X 4 MM MISC Use 3 (three) times daily. 02/16/24   Fairy Frames, MD  Insulin  Syringe-Needle U-100 (INSULIN   SYRINGE .5CC/30GX5/16) 30G X 5/16 0.5 ML MISC Use one syringe and needle per insulin  injection. 12/13/23   Grooms, [place], PA-C  Lancet Device MISC 1 each by Does not apply route 3 (three) times daily. May dispense any manufacturer covered by patient's insurance. 02/16/24   Fairy Frames, MD  losartan  (COZAAR ) 50 MG tablet Take 1 tablet (50 mg total) by mouth daily. 02/23/24   Hayes Beckey CROME, NP  potassium chloride  SA (KLOR-CON  M) 20 MEQ tablet Take 2 tablets (40 mEq total) by mouth daily  for 2 days. 02/23/24 02/25/24  Hayes Beckey CROME, NP  spironolactone  (ALDACTONE ) 25 MG tablet Take 1 tablet (25 mg total) by mouth daily. 02/23/24   Hayes Beckey CROME, NP    Allergies: Bovine (beef) protein-containing drug products, Porcine (pork) protein-containing drug products, Amoxicillin, Doxycycline, and Penicillins    Review of Systems  All other systems reviewed and are negative.   Updated Vital Signs BP (!) 144/68   Pulse 60   Temp 98.1 F (36.7 C) (Temporal)   Resp 15   Ht 1.753 m (5' 9)   Wt 65.3 kg   LMP 12/01/2011   SpO2 98%   BMI 21.27 kg/m   Physical Exam Vitals and nursing note reviewed.  Constitutional:      General: She is not in acute distress.    Appearance: She is well-developed.  HENT:     Head: Normocephalic and atraumatic.     Mouth/Throat:     Pharynx: No oropharyngeal exudate.  Eyes:     General: No scleral icterus.       Right eye: No discharge.        Left eye: No discharge.     Conjunctiva/sclera: Conjunctivae normal.     Pupils: Pupils are equal, round, and reactive to light.  Neck:     Thyroid: No thyromegaly.     Vascular: No JVD.  Cardiovascular:     Rate and Rhythm: Normal rate and regular rhythm.     Heart sounds: Normal heart sounds. No murmur heard.    No friction rub. No gallop.  Pulmonary:     Effort: Pulmonary effort is normal. No respiratory distress.     Breath sounds: Normal breath sounds. No wheezing or rales.  Abdominal:     General: Bowel sounds are normal. There is no distension.     Palpations: Abdomen is soft. There is no mass.     Tenderness: There is no abdominal tenderness.  Musculoskeletal:        General: No tenderness. Normal range of motion.     Cervical back: Normal range of motion and neck supple.     Right lower leg: Edema present.     Left lower leg: Edema present.  Lymphadenopathy:     Cervical: No cervical adenopathy.  Skin:    General: Skin is warm and dry.     Findings: No erythema or rash.   Neurological:     Mental Status: She is alert.     Coordination: Coordination normal.  Psychiatric:        Behavior: Behavior normal.     (all labs ordered are listed, but only abnormal results are displayed) Labs Reviewed  PRO BRAIN NATRIURETIC PEPTIDE - Abnormal; Notable for the following components:      Result Value   Pro Brain Natriuretic Peptide 4,538.0 (*)    All other components within normal limits  CBC WITH DIFFERENTIAL/PLATELET - Abnormal; Notable for the following components:   WBC 10.9 (*)  All other components within normal limits  COMPREHENSIVE METABOLIC PANEL WITH GFR - Abnormal; Notable for the following components:   Glucose, Bld 115 (*)    Calcium 8.6 (*)    Albumin 3.2 (*)    All other components within normal limits  TROPONIN T, HIGH SENSITIVITY - Abnormal; Notable for the following components:   Troponin T High Sensitivity 28 (*)    All other components within normal limits  MAGNESIUM   TROPONIN T, HIGH SENSITIVITY    EKG: EKG Interpretation Date/Time:  Sunday March 03 2024 15:49:59 EDT Ventricular Rate:  93 PR Interval:  152 QRS Duration:  78 QT Interval:  361 QTC Calculation: 449 R Axis:   58  Text Interpretation: Sinus rhythm Normal sinus rhythm Normal ECG Confirmed by Cleotilde Rogue ([id number]) on 03/03/2024 4:55:56 PM  Radiology: [facility] Chest Port 1 View Result Date: 03/03/2024 CLINICAL DATA:  Swelling to the abdomen, legs and face. EXAM: PORTABLE CHEST 1 VIEW COMPARISON:  February 14, 2024 FINDINGS: The heart size and mediastinal contours are within normal limits. Mild diffuse reticular opacities are noted throughout both lungs. No focal consolidation, pleural effusion or pneumothorax is identified. The visualized skeletal structures are unremarkable. IMPRESSION: Mild diffuse reticular opacities throughout both lungs, which may represent mild interstitial edema. Electronically Signed   By: Suzen Dials M.D.   On: 03/03/2024 16:16      Procedures   Medications Ordered in the ED  furosemide  (LASIX ) injection 40 mg (40 mg Intravenous Given 03/03/24 1605)                                    Medical Decision Making Amount and/or Complexity of Data Reviewed Labs: ordered. Radiology: ordered.  Risk Prescription drug management. Decision regarding hospitalization.    This patient presents to the ED for concern of increased swelling, this involves an extensive number of treatment options, and is a complaint that carries with it a high risk of complications and morbidity.  The differential diagnosis includes decompensated heart failure, could have some component of liver dysfunction as well as she does have a very low albumin,   Co morbidities / Chronic conditions that complicate the patient evaluation  Known congestive heart failure recently diagnosed, likely nonischemic   Additional history obtained:  Additional history obtained from EMR External records from outside source obtained and reviewed including medical record, see exhaustive history above   Lab Tests:  I Ordered, and personally interpreted labs.  The pertinent results include: CBC with no significant findings, metabolic panel is reassuring, troponin was 28, magnesium  of 2.4 and a BNP of over 4500 consistent with congestive heart failure   Imaging Studies ordered:  I ordered imaging studies including chest x-ray I independently visualized and interpreted imaging which showed interstitial opacities likely interstitial edema I agree with the radiologist interpretation   Cardiac Monitoring: / EKG:  The patient was maintained on a cardiac monitor.  I personally viewed and interpreted the cardiac monitored which showed an underlying rhythm of: Normal sinus rhythm   Problem List / ED Course / Critical interventions / Medication management  Patient was given IV Lasix , continues to be edematous, likely needs continued intravenous medicines as  she probably has some gastrointestinal edema, making it difficult to absorb the appropriate diuretics, I ordered medication including furosemide  Reevaluation of the patient after these medicines showed that the patient improvement I have reviewed the patients home medicines and have made  adjustments as needed   Consultations Obtained:  I requested consultation with the hospitalist Dr. Pearlean,  and discussed lab and imaging findings as well as pertinent plan - they recommend: Admission to the hospital   Social Determinants of Health:  CHF   Test / Admission - Considered:  Admit      Final diagnoses:  Acute on chronic congestive heart failure, unspecified heart failure type [facility])    ED Discharge Orders     None          Cleotilde Rogue, MD 03/03/24 1741

## 2024-03-03 NOTE — Telephone Encounter (Signed)
 Patient called answering service this afternoon to ask if she needs to go to the emergency department due to swelling, weight gain.  Tells me that she had been discharged from the hospital on 9/26 and weighed 119 pounds.  She was seen by the heart failure TOC clinic on 10/30 and her weight was up to 137 lbs. her Lasix  was increased and she was told to take Diamox  500 mg daily for 2 days.  Patient adjusted her medications appropriately.  She did not notice any improvement.  She has continued to gain weight since that time.  Her weight is currently up to 144 pounds.  Patient reports that overall her breathing feels okay.  She denies shortness of breath, but does feel more aware of her breathing.  She does have orthopnea.  Also has significant lower extremity swelling and abdominal distention.  As patient is up about 25 pounds and did not have any improvement despite addition of Diamox  and increase in Lasix , instructed patient to go to the ED for further evaluation.  Patient voiced understanding and agreement.

## 2024-03-03 NOTE — H&P (Addendum)
 History and Physical    Christie Anderson FMW:[id number] DOB: 1975/11/16 DOA: 03/03/2024  PCP: Mancil Pfeiffer, PA-C   Patient coming from: Home  I have personally briefly reviewed patient's old medical records in [facility]  Chief Complaint: Weight gain, leg swelling  HPI: Christie Anderson is a 48 y.o. female with medical history significant for systolic and diastolic CHF, hypertension, diabetes mellitus. Patient presented to the ED with complaints of bilateral lower extremity swelling, abdominal bloating and 25 pound weight gain that has gradually progressed since she was discharged from the hospital 9/26.  On discharge her weight was 119, she is weighing about 144 today. She followed up with her outpatient providers and was told to increase her dose of Lasix , and take Diamox  5500 mg for 2 days which she was compliant with.  Swelling and weight gain despite this. She denies chest pain, no difficulty breathing.  Recent hospitalization 9/19 to 9/26 also for decompensated CHF, was followed by heart failure team, discharged on Lasix  Entresto  and Aldactone .  SGLT2i-was discontinued due to uncontrolled diabetes.  She underwent right heart cath with endomyocardial biopsy, noted to have low filling pressures severely low cardiac index at 1.2, biopsy results with benign myocardium showing mild nonspecific cardial myocyte hyperplasia.  Negative for granulomas, limitation or any other infiltrative process.  ED Course: Temperature 98.1.  Heart rate 86-97.  Respirate rate 13-19.  Blood pressure systolic 136-156 O2 sats greater than 98% on room air. Pro-BNP elevated at 4538. Troponin 28. Chest x-ray shows mild diffuse reticular opacities bilaterally, suggesting mild interstitial edema. IV Lasix  40 mg x 1 given in ED.  Review of Systems: As per HPI all other systems reviewed and negative.  Past Medical History:  Diagnosis Date   CHF (congestive heart failure) (HCC)    Diabetes (HCC)     Past  Surgical History:  Procedure Laterality Date   ENDOMYOCARDIAL BIOPSY N/A 02/16/2024   Procedure: ENDOMYOCARDIAL BIOPSY;  Surgeon: Gardenia Led, DO;  Location: [facility] INVASIVE CV LAB;  Service: Cardiovascular;  Laterality: N/A;   RIGHT HEART CATH N/A 02/16/2024   Procedure: RIGHT HEART CATH;  Surgeon: Gardenia Led, DO;  Location: [facility] INVASIVE CV LAB;  Service: Cardiovascular;  Laterality: N/A;     reports that she has been smoking cigarettes. She has never used smokeless tobacco. No history on file for alcohol use and drug use.  Allergies  Allergen Reactions   Bovine (Beef) Protein-Containing Drug Products Hives and Swelling   Porcine (Pork) Protein-Containing Drug Products Hives and Swelling   Amoxicillin Other (See Comments)    Unknown    Doxycycline Nausea And Vomiting   Penicillins Other (See Comments)    Unknown     Family History  Problem Relation Age of Onset   Hypertension Mother    Stroke Mother    Stroke Paternal Grandmother     Prior to Admission medications   Medication Sig Start Date End Date Taking? Authorizing Provider  Accu-Chek Softclix Lancets lancets Use 3 (three) times daily as directed to check blood sugar. 02/16/24   Fairy Frames, MD  acetaZOLAMIDE  (DIAMOX ) 250 MG tablet Take 2 tablets (500 mg total) by mouth daily for 2 days. 02/23/24 02/25/24  Hayes Beckey CROME, NP  Blood Glucose Monitoring Suppl (BLOOD GLUCOSE MONITOR SYSTEM) w/Device KIT Use 3 (three) times daily. 02/16/24   Fairy Frames, MD  furosemide  (LASIX ) 40 MG tablet Take 1.5 tablets (60 mg total) by mouth daily. 02/23/24   Hayes Beckey CROME, NP  Glucose Blood (  BLOOD GLUCOSE TEST STRIPS) STRP Use 3 (three) times daily as directed to check blood sugar. 02/16/24   Fairy Frames, MD  Insulin  Glargine (BASAGLAR  KWIKPEN) 100 UNIT/ML Inject 30 Units into the skin every evening. May substitute as needed per insurance. 02/26/24   Grooms, Courtney, PA-C  insulin  lispro (HUMALOG ) 100 UNIT/ML KwikPen Inject 8 Units  into the skin 3 (three) times daily. Patient taking differently: Inject 8 Units into the skin 3 (three) times daily. Pt is taking 4-16 units based on blood sugar (sliding scale) pt does not have scale in front of her to read scale 02/21/24   Grooms, [place], PA-C  Insulin  Pen Needle 32G X 4 MM MISC Use 3 (three) times daily. 02/16/24   Fairy Frames, MD  Insulin  Syringe-Needle U-100 (INSULIN  SYRINGE .5CC/30GX5/16) 30G X 5/16 0.5 ML MISC Use one syringe and needle per insulin  injection. 12/13/23   Grooms, [place], PA-C  Lancet Device MISC 1 each by Does not apply route 3 (three) times daily. May dispense any manufacturer covered by patient's insurance. 02/16/24   Fairy Frames, MD  losartan  (COZAAR ) 50 MG tablet Take 1 tablet (50 mg total) by mouth daily. 02/23/24   Hayes Beckey CROME, NP  potassium chloride  SA (KLOR-CON  M) 20 MEQ tablet Take 2 tablets (40 mEq total) by mouth daily for 2 days. 02/23/24 02/25/24  Hayes Beckey CROME, NP  spironolactone  (ALDACTONE ) 25 MG tablet Take 1 tablet (25 mg total) by mouth daily. 02/23/24   Hayes Beckey CROME, NP    Physical Exam: Vitals:   03/03/24 1645 03/03/24 1715 03/03/24 1814 03/03/24 1853  BP: (!) 144/68 (!) 142/74  (!) 156/66  Pulse: 60 86  92  Resp: 15 13    Temp:   98.2 F (36.8 C) 97.7 F (36.5 C)  TempSrc:   Oral Oral  SpO2: 98% 99%  100%  Weight:    67.6 kg  Height:    5' 9 (1.753 m)    Constitutional: NAD, calm, comfortable Vitals:   03/03/24 1645 03/03/24 1715 03/03/24 1814 03/03/24 1853  BP: (!) 144/68 (!) 142/74  (!) 156/66  Pulse: 60 86  92  Resp: 15 13    Temp:   98.2 F (36.8 C) 97.7 F (36.5 C)  TempSrc:   Oral Oral  SpO2: 98% 99%  100%  Weight:    67.6 kg  Height:    5' 9 (1.753 m)   Eyes: PERRL, lids and conjunctivae normal ENMT: Mucous membranes are moist  Neck: normal, supple, no masses, no thyromegaly Respiratory: clear to auscultation bilaterally, no wheezing, no crackles. Normal respiratory effort. No accessory muscle use.   Cardiovascular: Regular rate and rhythm, 3/6 systolic murmurs / rubs / gallops.  1+ pitting bilateral lower extremity edema. Extremities warm. Abdomen: Abdomen full, no tenderness, no masses palpated. No hepatosplenomegaly.  Musculoskeletal: no clubbing / cyanosis. No joint deformity upper and lower extremities. Skin: no rashes, lesions, ulcers. No induration Neurologic:  No facial symmetry, moves extremity spontaneously, speech fluent.SABRA  Psychiatric: Normal judgment and insight. Alert and oriented x 3. Normal mood.   Labs on Admission: I have personally reviewed following labs and imaging studies  CBC: Recent Labs  Lab 03/03/24 1545  WBC 10.9*  NEUTROABS 7.6  HGB 12.3  HCT 37.5  MCV 94.9  PLT 375   Basic Metabolic Panel: Recent Labs  Lab 03/03/24 1545  NA 138  K 4.6  CL 103  CO2 25  GLUCOSE 115*  BUN 16  CREATININE 0.72  CALCIUM  8.6*  MG 2.4   GFR: Estimated Creatinine Clearance: 87.9 mL/min (by C-G formula based on SCr of 0.72 mg/dL). Liver Function Tests: Recent Labs  Lab 03/03/24 1545  AST 38  ALT 44  ALKPHOS 116  BILITOT <0.2  PROT 6.6  ALBUMIN 3.2*   BNP (last 3 results) Recent Labs    03/03/24 1545  PROBNP 4,538.0*   HbA1C: No results for input(s): HGBA1C in the last 72 hours. CBG: Recent Labs  Lab 03/03/24 1842  GLUCAP 62*   Urine analysis:    Component Value Date/Time   COLORURINE COLORLESS (A) 02/13/2024 2023   APPEARANCEUR CLEAR 02/13/2024 2023   LABSPEC 1.003 (L) 02/13/2024 2023   PHURINE 7.0 02/13/2024 2023   GLUCOSEU >=500 (A) 02/13/2024 2023   HGBUR NEGATIVE 02/13/2024 2023   BILIRUBINUR NEGATIVE 02/13/2024 2023   KETONESUR NEGATIVE 02/13/2024 2023   PROTEINUR 30 (A) 02/13/2024 2023   NITRITE NEGATIVE 02/13/2024 2023   LEUKOCYTESUR NEGATIVE 02/13/2024 2023    Radiological Exams on Admission: DG Chest Port 1 View Result Date: 03/03/2024 CLINICAL DATA:  Swelling to the abdomen, legs and face. EXAM: PORTABLE CHEST 1 VIEW  COMPARISON:  February 14, 2024 FINDINGS: The heart size and mediastinal contours are within normal limits. Mild diffuse reticular opacities are noted throughout both lungs. No focal consolidation, pleural effusion or pneumothorax is identified. The visualized skeletal structures are unremarkable. IMPRESSION: Mild diffuse reticular opacities throughout both lungs, which may represent mild interstitial edema. Electronically Signed   By: Suzen Dials M.D.   On: 03/03/2024 16:16    EKG: Independently reviewed.  Sinus rhythm, rate 93, QTc 449.  No significant change from prior.  Assessment/Plan Principal Problem:   Acute on chronic combined systolic (congestive) and diastolic (congestive) heart failure (HCC) Active Problems:   Essential hypertension   Type 2 diabetes mellitus with hyperglycemia, with long-term current use of insulin  (HCC)   Assessment and Plan:  Acute on chronic combined systolic and diastolic CHF-presenting with presenting with bilateral lower extremities swelling, abdominal bloating, 25 pound weight gain.  proBNP elevated at 4538.  Troponin 28 > 28.  Chest x-ray suggest mild interstitial edema.  Not hypoxic.  Reports compliance with Lasix  60 mg twice daily, spironolactone .  Entresto  is currently not on her medication list.  Last/recent echo 02/10/2024-EF of 35 to 40%.  - Recent hospitalization 9/19 to 9/26 - underwent right heart cath with endomyocardial biopsy, noted to have low filling pressures severely low cardiac index at 1.2, biopsy results with benign myocardium showing mild nonspecific cardial myocyte hyperplasia.  Negative for granulomas, limitation or any other infiltrative process. - Continue IV Lasix  40 twice daily  - Strict input output, daily weight, daily BMP. - Cardiology consult  Hypertension-stable. - Resume losartan , spironolactone   Diabetes mellitus - Uncontrolled, A1c 12.6-9/23/2025. CBG 62> 141-without intervention. - SSI- M -Resume long-acting  insulin  at reduced dose 20 units nightly (home dose 30 units)   DVT prophylaxis: SCDs Code Status: Full code Family Communication: Friend at bedside. Disposition Plan: > 2 days Consults called: Cardiology Admission status: Inpt Tele I certify that at the point of admission it is my clinical judgment that the patient will require inpatient hospital care spanning beyond 2 midnights from the point of admission due to high intensity of service, high risk for further deterioration and high frequency of surveillance required.    Author: Tully FORBES Carwin, MD 03/03/2024 9:25 PM  For on call review [email or web address].

## 2024-03-03 NOTE — ED Notes (Signed)
MD Miller at bedside. 

## 2024-03-03 NOTE — ED Triage Notes (Signed)
 Pt with swelling to abd/legs and face.  Recent admission for same.  Pt with weight gain 119 to 144lb  + CHF

## 2024-03-04 DIAGNOSIS — I5023 Acute on chronic systolic (congestive) heart failure: Secondary | ICD-10-CM

## 2024-03-04 LAB — BASIC METABOLIC PANEL WITH GFR
Anion gap: 8 (ref 5–15)
BUN: 17 mg/dL (ref 6–20)
CO2: 25 mmol/L (ref 22–32)
Calcium: 8.4 mg/dL — ABNORMAL LOW (ref 8.9–10.3)
Chloride: 104 mmol/L (ref 98–111)
Creatinine, Ser: 0.58 mg/dL (ref 0.44–1.00)
GFR, Estimated: >60 mL/min (ref >60)
Glucose, Bld: 139 mg/dL — ABNORMAL HIGH (ref 70–99)
Potassium: 4.7 mmol/L (ref 3.5–5.1)
Sodium: 136 mmol/L (ref 135–145)

## 2024-03-04 LAB — GLUCOSE, CAPILLARY
Glucose-Capillary: 78 mg/dL (ref 70–99)
Glucose-Capillary: 147 mg/dL — ABNORMAL HIGH (ref 70–99)
Glucose-Capillary: 254 mg/dL — ABNORMAL HIGH (ref 70–99)
Glucose-Capillary: 127 mg/dL — ABNORMAL HIGH (ref 70–99)
Glucose-Capillary: 146 mg/dL — ABNORMAL HIGH (ref 70–99)
Glucose-Capillary: 173 mg/dL — ABNORMAL HIGH (ref 70–99)

## 2024-03-04 MED ORDER — FUROSEMIDE 10 MG/ML IJ SOLN
60.0000 mg | Freq: Once | INTRAMUSCULAR | Status: AC
  Administered 2024-03-04: 60 mg via INTRAVENOUS
  Filled 2024-03-04: qty 6

## 2024-03-04 NOTE — Plan of Care (Signed)
   Problem: Education: Goal: Knowledge of General Education information will improve Description: Including pain rating scale, medication(s)/side effects and non-pharmacologic comfort measures Outcome: Progressing   Problem: Activity: Goal: Risk for activity intolerance will decrease Outcome: Progressing   Problem: Nutrition: Goal: Adequate nutrition will be maintained Outcome: Progressing

## 2024-03-04 NOTE — Progress Notes (Signed)
  Transition of Care [facility]) Screening Note   Patient Details  Name: Christie Anderson Date of Birth: 1975/11/18   Transition of Care [facility]) CM/SW Contact:    Hoy DELENA Bigness, LCSW Phone Number: 03/04/2024, 8:45 AM    Transition of Care Department [facility]) has reviewed patient and no TOC needs have been identified at this time. We will continue to monitor patient advancement through interdisciplinary progression rounds. If new patient transition needs arise, please place a TOC consult.    03/04/24 0844  TOC Brief Assessment  Insurance and Status Lapsed (Has applied for Medicaid)  Patient has primary care physician Yes  Home environment has been reviewed Home w/ 18y.o. son  Prior level of function: Independent  Prior/Current Home Services No current home services  Social Drivers of Health Review SDOH reviewed interventions complete  Readmission risk has been reviewed Yes  Transition of care needs no transition of care needs at this time

## 2024-03-04 NOTE — Progress Notes (Signed)
 Mobility Specialist Progress Note:    03/04/24 1215  Mobility  Activity Ambulated with assistance  Level of Assistance Independent  Assistive Device Front wheel walker  Distance Ambulated (ft) 240 ft  Range of Motion/Exercises Active;All extremities  Activity Response Tolerated well  Mobility Referral Yes  Mobility visit 1 Mobility  Mobility Specialist Start Time (ACUTE ONLY) 1215  Mobility Specialist Stop Time (ACUTE ONLY) 1235  Mobility Specialist Time Calculation (min) (ACUTE ONLY) 20 min   Pt received standing at doorway, agreeable to mobility. Independently able to stand and ambulate with RW. Tolerated well,  asx throughout. Left sitting EOB, all needs met.  Elodia Haviland Mobility Specialist Please contact via Special educational needs teacher or  Rehab office at [phone]

## 2024-03-04 NOTE — Progress Notes (Addendum)
 Made aware by dayshift nurse, Ronold, during report at 1900 that pt's blood sugar was low when she was admitted to the floor and would need Dextrose , as ordered. Rechecked her blood sugar; CBG read 141, pt was also requesting to eat. Dextrose  50% Solution not given, MD Emokpae made aware who discontinued the order. Pt stated her blood sugar tends to trend down during the night, CBG checked at 0350: 147. Pt did not need Novolog  at 2200 last night due to CBG of 182, following sliding scale orders. Attempted to obtain Reds Clip this morning at 0600, read Low Quality.

## 2024-03-04 NOTE — Progress Notes (Signed)
 Heart Failure Navigator Progress Note  Assessed for Heart & Vascular TOC clinic readiness.  Patient does not meet criteria due to current Advanced Heart Failure Team patient.  Hospital Follow-Up appointment scheduled for 03/12/24 @ 2:30 @ [facility].   Navigator will sign off at this time.  Charmaine Pines, RN, BSN [facility] Heart Failure Navigator Secure Chat Only

## 2024-03-04 NOTE — Discharge Instructions (Signed)
 Food Agency Name: [facility] Address: [address] [place], [place] [address] Phone: [phone] Website: www.adtsrc.org Services Offered: Meals on [employer] and Meals with Friends.   Home care, at home assisted living, volunteer services, Center  for Active Retirement, transportation  Agency Name: [facility] Address: Sites vary. Must call first. Food Pantry location: [address], [place], [place] [address] [employer] Phone: [phone] Website: none Services Offered: Museum/gallery curator, utility assistance if funds available Careers adviser for all of [place], [employer], [employer], Office manager and [employer] for [employer] area only) Walk-in  current Id and current address verification required.  Wed-Thurs: 9:30-12:00 Agency Name: [facility] Address: [address], [place], [place] [address]  Phone: [phone] or [phone] Website: none Services Offered: Food assistance Agency Name: [facility] Address: [address] [place], [place] [address]  Phone: [phone] or [phone]  Website: none Services Offered: Serves 1 hot meal a day at 11:00 am Monday-Sunday and 5 pm  on the second and fourth Sunday of each month Agency Name: [facility] Department of Health and [facility]  Services/Social Services  Address: [phone], [place], [place] [address] Phone: [phone] Website: [email or web address].[email or web address].[email or web address]  [email or web address] Services Offered: Sales executive, [facility] program Agency Name: [facility] Address: [address] [place], [place] [address] Phone: [phone] Website: www.rock[place]hope.[email or web address] Services Offered: Food pantry Tuesday, Wednesday and Thursday 9am-11:30am  (need appointment) and health clinic (9:00am-11:00am) Agency Name: [employer] [facility] Address: [address], [place] / [address], [place] Phone: [phone] [place] / [phone] [place] Website: OpinionTrades.tn NetworkAffair.co.za Services Offered: Civil Service fast streamer, food pantry, soup kitchen ([place]) emergency financial  assistance, thrift stores, showers & hygiene products ([place]),  Christmas Assistance, spiritual help

## 2024-03-04 NOTE — Consult Note (Signed)
 Cardiology Consultation   Patient ID: Christie Anderson MRN: [id number]; DOB: 04/10/1976  Admit date: 03/03/2024 Date of Consult: 03/04/2024  PCP:  Mancil Pfeiffer, PA-C   [facility] Providers Cardiologist:  HF clinic   Patient Profile: Christie Anderson is a 48 y.o. female with a hx of chronic HFrEF, DM2 who is being seen 03/04/2024 for the evaluation of SOB at the request of Dr Ricky.  History of Present Illness: [name] 48 yo female history of chronic HFrEF LVE 35-40%, DM2, present with 28 lbs weight gain, LE edema, SOB. Reports compliance with medications at time.  Recent admit 01/2024 with new diagnosis of HFrEF. Diuresed close to 50 lbs. Given LVH and low voltage on EKG questions about possible infiltrative process.  Discharge weight was 02/16/24 54.4 kg. Seen in HF clinic 10/2 with 15 lbs weight gain, , lasix  increased to 60mg  daily with plans for diamox   500mg  daily x 2 days.   01/2024 echo: LVEF 35-40%, mod LVH, grade I dd, mild RV dysfunction 01/2024 cMRI: suboptimal study, LVE 32%, nonspecific delayed enhancement. No specific findings of amyloid though suboptimal imaging 01/2024 RHC/bx: mean PA 13, PCWP 6, CI 2.3 by Fick and 1.2 by thermodilution 01/2024 limited echo :LVEF 35-40% 01/2024 endomyocardial biopsy: benign myocardium, no amyloid.    Past Medical History:  Diagnosis Date   CHF (congestive heart failure) (HCC)    Diabetes (HCC)     Past Surgical History:  Procedure Laterality Date   ENDOMYOCARDIAL BIOPSY N/A 02/16/2024   Procedure: ENDOMYOCARDIAL BIOPSY;  Surgeon: Gardenia Led, DO;  Location: [facility] INVASIVE CV [facility];  Service: Cardiovascular;  Laterality: N/A;   RIGHT HEART CATH N/A 02/16/2024   Procedure: RIGHT HEART CATH;  Surgeon: Gardenia Led, DO;  Location: [facility] INVASIVE CV LAB;  Service: Cardiovascular;  Laterality: N/A;      Scheduled Meds:  enoxaparin  (LOVENOX ) injection  40 mg Subcutaneous QHS   furosemide   40 mg Intravenous Q12H    insulin  aspart  0-15 Units Subcutaneous TID WC   insulin  aspart  0-5 Units Subcutaneous QHS   insulin  glargine  20 Units Subcutaneous QPM   losartan   50 mg Oral Daily   spironolactone   25 mg Oral Daily   Continuous Infusions:  PRN Meds: acetaminophen  **OR** acetaminophen , ondansetron  **OR** ondansetron  (ZOFRAN ) IV, polyethylene glycol  Allergies:    Allergies  Allergen Reactions   Bovine (Beef) Protein-Containing Drug Products Hives and Swelling   Porcine (Pork) Protein-Containing Drug Products Hives and Swelling   Amoxicillin Other (See Comments)    Unknown    Doxycycline Nausea And Vomiting   Penicillins Other (See Comments)    Unknown     Social History:   Social History   Socioeconomic History   Marital status: Single    Spouse name: Not on file   Number of children: 1   Years of education: Not on file   Highest education level: GED or equivalent  Occupational History   Occupation: Unemployed  Tobacco Use   Smoking status: Every Day    Types: Cigarettes   Smokeless tobacco: Never  Substance and Sexual Activity   Alcohol use: Not on file   Drug use: Not on file   Sexual activity: Not on file  Other Topics Concern   Not on file  Social History Narrative   Not on file   Social Drivers of Health   Financial Resource Strain: Medium Risk (02/26/2024)   Overall Financial Resource Strain (CARDIA)    Difficulty of Paying Living  Expenses: Somewhat hard  Food Insecurity: Food Insecurity Present (03/03/2024)   Hunger Vital Sign    Worried About Running Out of Food in the Last Year: Sometimes true    Ran Out of Food in the Last Year: Sometimes true  Transportation Needs: No Transportation Needs (03/03/2024)   PRAPARE - Administrator, Civil Service (Medical): No    Lack of Transportation (Non-Medical): No  Physical Activity: Not on file  Stress: Not on file  Social Connections: Not on file  Intimate Partner Violence: Not At Risk (03/03/2024)    Humiliation, Afraid, Rape, and Kick questionnaire    Fear of Current or Ex-Partner: No    Emotionally Abused: No    Physically Abused: No    Sexually Abused: No    Family History:    Family History  Problem Relation Age of Onset   Hypertension Mother    Stroke Mother    Stroke Paternal Grandmother      ROS:  Please see the history of present illness.   All other ROS reviewed and negative.     Physical Exam/Data: Vitals:   03/03/24 1853 03/03/24 2316 03/04/24 0354 03/04/24 0819  BP: (!) 156/66 (!) 152/91 133/75 132/87  Pulse: 92 87 80 91  Resp:      Temp: 97.7 F (36.5 C) 98.1 F (36.7 C) 98.4 F (36.9 C) 97.6 F (36.4 C)  TempSrc: Oral Oral Oral Axillary  SpO2: 100% 99% 98% 100%  Weight: 67.6 kg  66.4 kg   Height: 5' 9 (1.753 m)       Intake/Output Summary (Last 24 hours) at 03/04/2024 0939 Last data filed at 03/04/2024 9374 Gross per 24 hour  Intake 240 ml  Output --  Net 240 ml      03/04/2024    3:54 AM 03/03/2024    6:53 PM 03/03/2024    3:37 PM  Last 3 Weights  Weight (lbs) 146 lb 6.2 oz 149 lb 1.6 oz 144 lb  Weight (kg) 66.4 kg 67.631 kg 65.318 kg     Body mass index is 21.62 kg/m.  General:  Well nourished, well developed, in no acute distress HEENT: normal Neck: + JVD Vascular: No carotid bruits; Distal pulses 2+ bilaterally Cardiac:  normal S1, S2; RRR; no murmur  Lungs:  mild crackles bilaterally Abd: soft, nontender, no hepatomegaly  Ext: 2+ bilateral LE edema Musculoskeletal:  No deformities, BUE and BLE strength normal and equal Skin: warm and dry  Neuro:  CNs 2-12 intact, no focal abnormalities noted Psych:  Normal affect    Laboratory Data: High Sensitivity Troponin:   Recent Labs  Lab 02/09/24 1625 02/09/24 1943  TROPONINIHS 18* 18*     Chemistry Recent Labs  Lab 03/03/24 1545 03/04/24 0323  NA 138 136  K 4.6 4.7  CL 103 104  CO2 25 25  GLUCOSE 115* 139*  BUN 16 17  CREATININE 0.72 0.58  CALCIUM 8.6* 8.4*  MG  2.4  --   GFRNONAA >60 >60  ANIONGAP 11 8    Recent Labs  Lab 03/03/24 1545  PROT 6.6  ALBUMIN 3.2*  AST 38  ALT 44  ALKPHOS 116  BILITOT <0.2   Lipids No results for input(s): CHOL, TRIG, HDL, LABVLDL, LDLCALC, CHOLHDL in the last 168 hours.  Hematology Recent Labs  Lab 03/03/24 1545  WBC 10.9*  RBC 3.95  HGB 12.3  HCT 37.5  MCV 94.9  MCH 31.1  MCHC 32.8  RDW 12.5  PLT 375  Thyroid No results for input(s): TSH, FREET4 in the last 168 hours.  BNP Recent Labs  Lab 03/03/24 1545  PROBNP 4,538.0*    DDimer No results for input(s): DDIMER in the last 168 hours.  Radiology/Studies:  DG Chest Port 1 View Result Date: 03/03/2024 CLINICAL DATA:  Swelling to the abdomen, legs and face. EXAM: PORTABLE CHEST 1 VIEW COMPARISON:  February 14, 2024 FINDINGS: The heart size and mediastinal contours are within normal limits. Mild diffuse reticular opacities are noted throughout both lungs. No focal consolidation, pleural effusion or pneumothorax is identified. The visualized skeletal structures are unremarkable. IMPRESSION: Mild diffuse reticular opacities throughout both lungs, which may represent mild interstitial edema. Electronically Signed   By: Suzen Dials M.D.   On: 03/03/2024 16:16     Assessment and Plan: 1.Acute on chronic HFrEF - new diagnosis of HFrEF during 92/2025 admission, diuresed 50 lbs 01/2024 echo: LVEF 35-40%, mod LVH, grade I dd, mild RV dysfunction 01/2024 cMRI: suboptimal study, LVE 32%, nonspecific delayed enhancement. No specific findings of amyloid though suboptimal imaging 01/2024 RHC/bx: mean PA 13, PCWP 6, CI 2.3 by Fick and 1.2 by thermodilution 01/2024 limited echo :LVEF 35-40% 01/2024 endomyocardial biopsy: benign myocardium, no amyloid.   -discharge wt 54.4, 120 lbs. This admit weight 148 lbs CXR mild edema, proBNP 4538 - received IV lasix  40mg  x 1 yesterday, due for 40mg  bid today. Early incomplete I/Os thus far, wt  148-->146 lbs. Renal function is stable. Remains fluid overloaded, continue IV lasix . Received IV lasix  40mg  in AM, will dose 60mg  in PM and reassess tomorrow AM. She really wants to go home, discussed up 25+ lbs best option is inpatient diuresis, she agrees to stay today but wants to reassess tomorrow.    - entresto  changed to losartan  at HF clinic due to cost - jardiance  was stopped during admission due to severely poor contolled DM2 - beta blocker avoid assume due to low CI - from last HF clinic note may need to consider advanced therapies in the near future - see how she does with initial diuresis here, if difficulty would consider transfer to get HF team involved. May have just been insufficent diuretic dosing as etiology of her exacerbation.     Risk Assessment/Risk Scores:       [place]  Heart Association (NYHA) Functional Class NYHA Class III       For questions or updates, please contact [facility] Please consult [email or web address] for contact info under      Signed, Alvan Carrier, MD  03/04/2024 9:39 AM

## 2024-03-04 NOTE — Progress Notes (Signed)
 Progress Note   Patient: KEAUNDRA STEHLE FMW:[id number] DOB: April 14, 1976 DOA: 03/03/2024     1 DOS: the patient was seen and examined on 03/04/2024   Brief hospital admission narrative: As per H&P written by Dr. Pearlean on 03/03/2024 DARILYN STORBECK is a 48 y.o. female with medical history significant for systolic and diastolic CHF, hypertension, diabetes mellitus. Patient presented to the ED with complaints of bilateral lower extremity swelling, abdominal bloating and 25 pound weight gain that has gradually progressed since she was discharged from the hospital 9/26.  On discharge her weight was 119, she is weighing about 144 today. She followed up with her outpatient providers and was told to increase her dose of Lasix , and take Diamox  5500 mg for 2 days which she was compliant with.  Swelling and weight gain despite this. She denies chest pain, no difficulty breathing.   Recent hospitalization 9/19 to 9/26 also for decompensated CHF, was followed by heart failure team, discharged on Lasix  Entresto  and Aldactone .  SGLT2i-was discontinued due to uncontrolled diabetes.  She underwent right heart cath with endomyocardial biopsy, noted to have low filling pressures severely low cardiac index at 1.2, biopsy results with benign myocardium showing mild nonspecific cardial myocyte hyperplasia.  Negative for granulomas, limitation or any other infiltrative process.   ED Course: Temperature 98.1.  Heart rate 86-97.  Respirate rate 13-19.  Blood pressure systolic 136-156 O2 sats greater than 98% on room air. Pro-BNP elevated at 4538. Troponin 28. Chest x-ray shows mild diffuse reticular opacities bilaterally, suggesting mild interstitial edema. IV Lasix  40 mg x 1 given in ED.   Assessment and plan 1-acute on chronic combined systolic and diastolic heart failure - Patient still with signs of fluid overload/anasarca - No oxygen supplementation currently needed - Roughly 25 pounds gained since last  discharge - Appreciate assistance and recommendation by cardiology service - Continue IV diuresis-low-sodium diet, strict intake and output and daily weights will be continued. -check REDs Clip measurements and Follow clinical response.  2-hypertension - Continue losartan  and spironolactone . - Follow-up vital signs.  3-uncontrolled type 2 diabetes with hyperglycemia - Follow CBG fluctuation - Continue sliding scale insulin  and Long acting insulin . - Modified carbohydrate diet discussed with patient. - Recent A1c in the 12 range   Subjective:  No fever, no chest pain, no nausea vomiting.  Positive anasarca appreciated on exam.  Patient reporting mild orthopnea.  Physical Exam: Vitals:   03/03/24 1853 03/03/24 2316 03/04/24 0354 03/04/24 0819  BP: (!) 156/66 (!) 152/91 133/75 132/87  Pulse: 92 87 80 91  Resp:    18  Temp: 97.7 F (36.5 C) 98.1 F (36.7 C) 98.4 F (36.9 C) 97.6 F (36.4 C)  TempSrc: Oral Oral Oral Axillary  SpO2: 100% 99% 98% 100%  Weight: 67.6 kg  66.4 kg   Height: 5' 9 (1.753 m)      General exam: Alert, awake, oriented x 3; no chest pain, no nausea, no vomiting.  Fluid overload and anasarca appreciated on exam. Respiratory system: Good air movement bilaterally; no using accessory muscle.  Decreased breath sounds at the bases. Cardiovascular system:RRR. No murmurs, rubs, gallops. Gastrointestinal system: Abdomen is soft and nontender; increased abdominal girth appreciated on exam.  Positive bowel sounds. Central nervous system: No focal neurological deficits. Extremities: No cyanosis or clubbing; 3+ edema appreciated bilaterally. Skin: No petechiae. Psychiatry: Judgement and insight appear normal. Mood & affect appropriate.    Data Reviewed: Basic metabolic panel: Sodium 136, potassium 4.7, chloride 104, bicarb  25, BUN 17, creatinine 0.58 and GFR> 60  Family Communication:  No family at bedside.  Disposition: Status is: Inpatient Remains inpatient  appropriate because: Continue IV diuresis.  Anticipating discharge back home once medically stable.  Time spent: 50 minutes  Author: Eric Nunnery, MD 03/04/2024 12:35 PM  For on call review [email or web address].

## 2024-03-04 NOTE — Plan of Care (Signed)
   Problem: Education: Goal: Knowledge of General Education information will improve Description Including pain rating scale, medication(s)/side effects and non-pharmacologic comfort measures Outcome: Progressing   Problem: Health Behavior/Discharge Planning: Goal: Ability to manage health-related needs will improve Outcome: Progressing

## 2024-03-05 ENCOUNTER — Encounter: Payer: Self-pay | Admitting: *Deleted

## 2024-03-05 ENCOUNTER — Telehealth: Payer: Self-pay | Admitting: *Deleted

## 2024-03-05 LAB — BASIC METABOLIC PANEL WITH GFR
Anion gap: 8 (ref 5–15)
BUN: 16 mg/dL (ref 6–20)
CO2: 27 mmol/L (ref 22–32)
Calcium: 8.3 mg/dL — ABNORMAL LOW (ref 8.9–10.3)
Chloride: 104 mmol/L (ref 98–111)
Creatinine, Ser: 0.52 mg/dL (ref 0.44–1.00)
GFR, Estimated: >60 mL/min (ref >60)
Glucose, Bld: 44 mg/dL — CL (ref 70–99)
Potassium: 4.4 mmol/L (ref 3.5–5.1)
Sodium: 138 mmol/L (ref 135–145)

## 2024-03-05 LAB — GLUCOSE, CAPILLARY
Glucose-Capillary: 267 mg/dL — ABNORMAL HIGH (ref 70–99)
Glucose-Capillary: 30 mg/dL — CL (ref 70–99)
Glucose-Capillary: 30 mg/dL — CL (ref 70–99)
Glucose-Capillary: 36 mg/dL — CL (ref 70–99)
Glucose-Capillary: 99 mg/dL (ref 70–99)
Glucose-Capillary: 182 mg/dL — ABNORMAL HIGH (ref 70–99)
Glucose-Capillary: 355 mg/dL — ABNORMAL HIGH (ref 70–99)
Glucose-Capillary: 197 mg/dL — ABNORMAL HIGH (ref 70–99)
Glucose-Capillary: 362 mg/dL — ABNORMAL HIGH (ref 70–99)
Glucose-Capillary: 233 mg/dL — ABNORMAL HIGH (ref 70–99)

## 2024-03-05 LAB — MAGNESIUM: Magnesium: 2.4 mg/dL (ref 1.7–2.4)

## 2024-03-05 MED ORDER — INSULIN GLARGINE 100 UNIT/ML ~~LOC~~ SOLN
17.0000 [IU] | Freq: Every evening | SUBCUTANEOUS | Status: DC
  Administered 2024-03-05: 17 [IU] via SUBCUTANEOUS
  Filled 2024-03-05 (×2): qty 0.17

## 2024-03-05 MED ORDER — FUROSEMIDE 10 MG/ML IJ SOLN
80.0000 mg | Freq: Two times a day (BID) | INTRAMUSCULAR | Status: AC
  Administered 2024-03-05 (×2): 80 mg via INTRAVENOUS
  Filled 2024-03-05 (×2): qty 8

## 2024-03-05 MED ORDER — INSULIN ASPART 100 UNIT/ML IJ SOLN
3.0000 [IU] | Freq: Once | INTRAMUSCULAR | Status: AC
  Administered 2024-03-05: 3 [IU] via SUBCUTANEOUS

## 2024-03-05 MED ORDER — INSULIN ASPART 100 UNIT/ML IJ SOLN
0.0000 [IU] | Freq: Three times a day (TID) | INTRAMUSCULAR | Status: DC
  Administered 2024-03-05: 9 [IU] via SUBCUTANEOUS
  Administered 2024-03-05: 2 [IU] via SUBCUTANEOUS
  Administered 2024-03-06: 9 [IU] via SUBCUTANEOUS
  Administered 2024-03-06: 3 [IU] via SUBCUTANEOUS
  Administered 2024-03-07: 2 [IU] via SUBCUTANEOUS
  Administered 2024-03-07: 7 [IU] via SUBCUTANEOUS

## 2024-03-05 MED ORDER — DEXTROSE 50 % IV SOLN
INTRAVENOUS | Status: AC
  Filled 2024-03-05: qty 50

## 2024-03-05 MED ORDER — LOPERAMIDE HCL 2 MG PO CAPS
4.0000 mg | ORAL_CAPSULE | Freq: Once | ORAL | Status: AC
  Administered 2024-03-05: 4 mg via ORAL
  Filled 2024-03-05: qty 2

## 2024-03-05 MED ORDER — DEXTROSE 50 % IV SOLN: 25.0000 g | INTRAVENOUS | Status: AC

## 2024-03-05 NOTE — Progress Notes (Signed)
 Mobility Specialist Progress Note:    03/05/24 0925  Mobility  Activity Ambulated with assistance  Level of Assistance Independent  Assistive Device Front wheel walker  Distance Ambulated (ft) 800 ft  Range of Motion/Exercises Active;All extremities  Activity Response Tolerated well  Mobility Referral Yes  Mobility visit 1 Mobility  Mobility Specialist Start Time (ACUTE ONLY) [id number]  Mobility Specialist Stop Time (ACUTE ONLY) 0945  Mobility Specialist Time Calculation (min) (ACUTE ONLY) 20 min   Pt received in bed, agreeable to mobility. Independently able to stand and ambulate with RW. Tolerated well, asx throughout. Returned supine, all needs met.   Christie Anderson Mobility Specialist Please contact via Special educational needs teacher or  Rehab office at [phone]

## 2024-03-05 NOTE — Plan of Care (Signed)

## 2024-03-05 NOTE — Progress Notes (Signed)
 Progress Note   Patient: Christie Anderson FMW:[id number] DOB: 06/05/1975 DOA: 03/03/2024     2 DOS: the patient was seen and examined on 03/05/2024   Brief hospital admission narrative: As per H&P written by Dr. Pearlean on 03/03/2024 ANAPAOLA KINSEL is a 48 y.o. female with medical history significant for systolic and diastolic CHF, hypertension, diabetes mellitus. Patient presented to the ED with complaints of bilateral lower extremity swelling, abdominal bloating and 25 pound weight gain that has gradually progressed since she was discharged from the hospital 9/26.  On discharge her weight was 119, she is weighing about 144 today. She followed up with her outpatient providers and was told to increase her dose of Lasix , and take Diamox  5500 mg for 2 days which she was compliant with.  Swelling and weight gain despite this. She denies chest pain, no difficulty breathing.   Recent hospitalization 9/19 to 9/26 also for decompensated CHF, was followed by heart failure team, discharged on Lasix  Entresto  and Aldactone .  SGLT2i-was discontinued due to uncontrolled diabetes.  She underwent right heart cath with endomyocardial biopsy, noted to have low filling pressures severely low cardiac index at 1.2, biopsy results with benign myocardium showing mild nonspecific cardial myocyte hyperplasia.  Negative for granulomas, limitation or any other infiltrative process.   ED Course: Temperature 98.1.  Heart rate 86-97.  Respirate rate 13-19.  Blood pressure systolic 136-156 O2 sats greater than 98% on room air. Pro-BNP elevated at 4538. Troponin 28. Chest x-ray shows mild diffuse reticular opacities bilaterally, suggesting mild interstitial edema. IV Lasix  40 mg x 1 given in ED.   Assessment and plan 1-acute on chronic combined systolic and diastolic heart failure - Patient still with signs of fluid overload/anasarca - No oxygen supplementation currently needed - Roughly 25 pounds gained since last  discharge - Appreciate assistance and recommendation by cardiology service - Continue IV diuresis(dose adjusted and directed by cardiology service) -continue low-sodium diet, strict intake and output and daily weights will be continued. -check REDs Clip measurements and Follow clinical response.  2-hypertension - Continue losartan  and spironolactone . - Follow-up vital signs.  3-uncontrolled type 2 diabetes with hyperglycemia and hypoglycemia - Follow CBG fluctuation - Continue sliding scale insulin  and Long acting insulin . Dose adjusted to prevent low CBG's - Modified carbohydrate diet discussed with patient. - Recent A1c in the 12 range   Subjective:  No orthopnea, good sat on RA, no CP and no nausea or vomiting. Patient reporting improvement in swelling and having good urine output.  Physical Exam: Vitals:   03/05/24 0414 03/05/24 0442 03/05/24 0812 03/05/24 1943  BP: 118/69   133/77  Pulse: 82   76  Resp: 12     Temp: 97.6 F (36.4 C)   98.4 F (36.9 C)  TempSrc: Oral   Oral  SpO2: 99%   100%  Weight:  65.2 kg 65.5 kg   Height:       General exam: Alert, awake, oriented x 3; no orthopnea and good saturation on room air.  Reports good urine output.  Still with signs of fluid overload. Respiratory system: Good saturation on room air. Cardiovascular system:RRR. No rubs, no gallops, no JVD on exam Gastrointestinal system: Abdomen is nondistended, soft and nontender. No organomegaly or masses felt. Positive BS and increase abd girth. Central nervous system: Alert and oriented. No focal neurological deficits. Extremities: No cyanosis, no clubbing, 2++ edema Skin: No petechiae. Psychiatry: Judgement and insight appear normal. Mood & affect appropriate.   Data Reviewed:  Basic metabolic panel: Sodium 138, potassium 4.4, chloride 104, bicarb 27, glucose 44, BUN 16, creatinine 0.52 and GFR >60 Mg:2.4  Family Communication:  No family at bedside.  Disposition: Status is:  Inpatient Remains inpatient appropriate because: Continue IV diuresis.  Anticipating discharge back home once medically stable.  Time spent: 50 minutes  Author: Eric Nunnery, MD 03/05/2024 8:19 PM  For on call review [email or web address].

## 2024-03-05 NOTE — Progress Notes (Signed)
 Rounding Note   Patient Name: Christie Anderson Date of Encounter: 03/05/2024  [facility] Cardiologist: HF clinic  Subjective No complaints  Scheduled Meds:  dextrose   25 g Intravenous STAT   dextrose        enoxaparin  (LOVENOX ) injection  40 mg Subcutaneous QHS   insulin  aspart  0-5 Units Subcutaneous QHS   insulin  aspart  0-9 Units Subcutaneous TID WC   insulin  glargine  17 Units Subcutaneous QPM   losartan   50 mg Oral Daily   spironolactone   25 mg Oral Daily   Continuous Infusions:  PRN Meds: acetaminophen  **OR** acetaminophen , dextrose , ondansetron  **OR** ondansetron  (ZOFRAN ) IV, polyethylene glycol   Vital Signs  Vitals:   03/04/24 1957 03/05/24 0414 03/05/24 0442 03/05/24 0812  BP: 134/83 118/69    Pulse: 84 82    Resp: 14 12    Temp: 97.7 F (36.5 C) 97.6 F (36.4 C)    TempSrc: Oral Oral    SpO2: 98% 99%    Weight:   65.2 kg 65.5 kg  Height:        Intake/Output Summary (Last 24 hours) at 03/05/2024 0908 Last data filed at 03/05/2024 0500 Gross per 24 hour  Intake 1080 ml  Output --  Net 1080 ml      03/05/2024    8:12 AM 03/05/2024    4:42 AM 03/04/2024    3:54 AM  Last 3 Weights  Weight (lbs) 144 lb 6.4 oz 143 lb 11.8 oz 146 lb 6.2 oz  Weight (kg) 65.5 kg 65.2 kg 66.4 kg      Telemetry NSR - Personally Reviewed  ECG  N/a - Personally Reviewed  Physical Exam  GEN: No acute distress.   Neck: No JVD Cardiac: RRR, no murmurs, rubs, or gallops.  Respiratory: crackles bilaterally GI: Soft, nontender, non-distended  MS: 2+ bilateral LE edema Neuro:  Nonfocal  Psych: Normal affect   Labs High Sensitivity Troponin:   Recent Labs  Lab 02/09/24 1625 02/09/24 1943  TROPONINIHS 18* 18*     Chemistry Recent Labs  Lab 03/03/24 1545 03/04/24 0323 03/05/24 0440  NA 138 136 138  K 4.6 4.7 4.4  CL 103 104 104  CO2 25 25 27   GLUCOSE 115* 139* 44*  BUN 16 17 16   CREATININE 0.72 0.58 0.52  CALCIUM 8.6* 8.4* 8.3*  MG 2.4   --  2.4  PROT 6.6  --   --   ALBUMIN 3.2*  --   --   AST 38  --   --   ALT 44  --   --   ALKPHOS 116  --   --   BILITOT <0.2  --   --   GFRNONAA >60 >60 >60  ANIONGAP 11 8 8     Lipids No results for input(s): CHOL, TRIG, HDL, LABVLDL, LDLCALC, CHOLHDL in the last 168 hours.  Hematology Recent Labs  Lab 03/03/24 1545  WBC 10.9*  RBC 3.95  HGB 12.3  HCT 37.5  MCV 94.9  MCH 31.1  MCHC 32.8  RDW 12.5  PLT 375   Thyroid No results for input(s): TSH, FREET4 in the last 168 hours.  BNP Recent Labs  Lab 03/03/24 1545  PROBNP 4,538.0*    DDimer No results for input(s): DDIMER in the last 168 hours.   Radiology  DG Chest Port 1 View Result Date: 03/03/2024 CLINICAL DATA:  Swelling to the abdomen, legs and face. EXAM: PORTABLE CHEST 1 VIEW COMPARISON:  February 14, 2024 FINDINGS: The  heart size and mediastinal contours are within normal limits. Mild diffuse reticular opacities are noted throughout both lungs. No focal consolidation, pleural effusion or pneumothorax is identified. The visualized skeletal structures are unremarkable. IMPRESSION: Mild diffuse reticular opacities throughout both lungs, which may represent mild interstitial edema. Electronically Signed   By: Suzen Dials M.D.   On: 03/03/2024 16:16      Patient Profile   Christie Anderson is a 48 y.o. female with a hx of chronic HFrEF, DM2 who is being seen 03/04/2024 for the evaluation of SOB at the request of Dr Ricky.   Assessment & Plan   1.Acute on chronic HFrEF - new diagnosis of HFrEF during 92/2025 admission, diuresed 50 lbs 01/2024 echo: LVEF 35-40%, mod LVH, grade I dd, mild RV dysfunction 01/2024 cMRI: suboptimal study, LVE 32%, nonspecific delayed enhancement. No specific findings of amyloid though suboptimal imaging 01/2024 RHC/bx: mean PA 13, PCWP 6, CI 2.3 by Fick and 1.2 by thermodilution 01/2024 limited echo :LVEF 35-40% 01/2024 endomyocardial biopsy: benign myocardium, no  amyloid.    -discharge wt 54.4, 120 lbs. This admit weight 148 lbs CXR mild edema, proBNP 4538 - received IV lasix  40mg  and 60mg  yesterday. I/Os are incomplete, weight 148 on admit down to 144 lbs. Renal function is stable.  - remains fluid overloaded, dose IV lasix  80mg  bid today.  - would plan for oral torsemide 40mg  daily at discharge.     - entresto  changed to losartan  at HF clinic due to cost - jardiance  was stopped during prior admission due to severely poor contolled DM2 - beta blocker avoid assume due to low CI - from last HF clinic note may need to consider advanced therapies in the near future          For questions or updates, please contact [facility] Please consult [email or web address] for contact info under       Signed, Alvan Carrier, MD  03/05/2024, 9:08 AM

## 2024-03-05 NOTE — Care Plan (Signed)
 Pt refused Reds clip reading this morning

## 2024-03-05 NOTE — Progress Notes (Signed)
 Mobility Specialist Progress Note:    03/05/24 1400  Mobility  Activity Ambulated with assistance  Level of Assistance Independent  Assistive Device Front wheel walker  Distance Ambulated (ft) 800 ft  Range of Motion/Exercises Active;All extremities  Activity Response Tolerated well  Mobility Referral Yes  Mobility visit 1 Mobility  Mobility Specialist Start Time (ACUTE ONLY) 1400  Mobility Specialist Stop Time (ACUTE ONLY) 1420  Mobility Specialist Time Calculation (min) (ACUTE ONLY) 20 min   Pt received in bed, agreeable to mobility. Independently able to stand and ambulate with RW. Tolerated well, asx throughout. Left sitting EOB, all needs met.  Cynara Tatham Mobility Specialist Please contact via Special educational needs teacher or  Rehab office at [phone]

## 2024-03-05 NOTE — Care Plan (Signed)
 At around 0445 the tech came to check the pt's vital signs and get her weight on the scale and the pt started complaining of dizziness and impaired vision. She stated she thought her blood sugar may have dropped. The blood sugar was checked and it was 36. Immediately gave pt orange juice, apple juice, and peanut butter crackers to consume. After 15 minutes rechecked pt's blood sugar and it was 30. Informed MD and orders for dextrose  50 were placed. However when went to administer the pt refused stating she was starting to feel better after she ate a chocolate bar. Blood sugar was again rechecked at this time and it was 99. Will continue to monitor pt's symptoms and blood sugar throughout the morning.

## 2024-03-06 ENCOUNTER — Encounter (HOSPITAL_COMMUNITY): Payer: Self-pay

## 2024-03-06 LAB — GLUCOSE, CAPILLARY
Glucose-Capillary: 70 mg/dL (ref 70–99)
Glucose-Capillary: 86 mg/dL (ref 70–99)
Glucose-Capillary: 245 mg/dL — ABNORMAL HIGH (ref 70–99)
Glucose-Capillary: 231 mg/dL — ABNORMAL HIGH (ref 70–99)
Glucose-Capillary: 129 mg/dL — ABNORMAL HIGH (ref 70–99)
Glucose-Capillary: 136 mg/dL — ABNORMAL HIGH (ref 70–99)
Glucose-Capillary: 397 mg/dL — ABNORMAL HIGH (ref 70–99)
Glucose-Capillary: 239 mg/dL — ABNORMAL HIGH (ref 70–99)

## 2024-03-06 LAB — BASIC METABOLIC PANEL WITH GFR
Anion gap: 7 (ref 5–15)
BUN: 14 mg/dL (ref 6–20)
CO2: 28 mmol/L (ref 22–32)
Calcium: 8.3 mg/dL — ABNORMAL LOW (ref 8.9–10.3)
Chloride: 100 mmol/L (ref 98–111)
Creatinine, Ser: 0.57 mg/dL (ref 0.44–1.00)
GFR, Estimated: >60 mL/min (ref >60)
Glucose, Bld: 281 mg/dL — ABNORMAL HIGH (ref 70–99)
Potassium: 5.1 mmol/L (ref 3.5–5.1)
Sodium: 134 mmol/L — ABNORMAL LOW (ref 135–145)

## 2024-03-06 MED ORDER — INSULIN GLARGINE 100 UNIT/ML ~~LOC~~ SOLN
10.0000 [IU] | Freq: Every evening | SUBCUTANEOUS | Status: DC
  Administered 2024-03-06: 10 [IU] via SUBCUTANEOUS
  Filled 2024-03-06 (×2): qty 0.1

## 2024-03-06 MED ORDER — ALBUMIN HUMAN 25 % IV SOLN
25.0000 g | Freq: Once | INTRAVENOUS | Status: AC
  Administered 2024-03-06: 25 g via INTRAVENOUS
  Filled 2024-03-06: qty 100

## 2024-03-06 MED ORDER — FUROSEMIDE 10 MG/ML IJ SOLN
80.0000 mg | Freq: Three times a day (TID) | INTRAMUSCULAR | Status: AC
  Administered 2024-03-06 (×3): 80 mg via INTRAVENOUS
  Filled 2024-03-06 (×3): qty 8

## 2024-03-06 NOTE — Progress Notes (Signed)
 Mobility Specialist Progress Note:    03/06/24 0900  Mobility  Activity Ambulated with assistance  Level of Assistance Independent  Assistive Device Front wheel walker  Distance Ambulated (ft) 1000 ft  Range of Motion/Exercises Active;All extremities  Activity Response Tolerated well  Mobility Referral Yes  Mobility visit 1 Mobility  Mobility Specialist Start Time (ACUTE ONLY) 0900  Mobility Specialist Stop Time (ACUTE ONLY) 0925  Mobility Specialist Time Calculation (min) (ACUTE ONLY) 25 min   Pt received standing at doorway. Independently able to ambulate with RW. Tolerated well, asx throughout. Left sitting EOB, all needs met.  Temple Ewart Mobility Specialist Please contact via Special educational needs teacher or  Rehab office at [phone]

## 2024-03-06 NOTE — Plan of Care (Signed)

## 2024-03-06 NOTE — Progress Notes (Signed)
 Progress Note   Patient: Christie Anderson FMW:[id number] DOB: 11/14/1975  DOA: 03/03/2024     3 DOS: the patient was seen and examined on 03/06/2024    Subjective:  The patient was seen and examined this morning, reporting she is diuresing well Improved shortness of breath Hemodynamically stable   Brief hospital admission narrative: Christie Anderson is a 48 y.o. female with medical history significant for systolic and diastolic CHF, hypertension, diabetes mellitus. Patient presented to the ED with complaints of bilateral lower extremity swelling, abdominal bloating and 25 pound weight gain that has gradually progressed since she was discharged from the hospital 9/26.  On discharge her weight was 119, she is weighing about 144 today. She followed up with her outpatient providers and was told to increase her dose of Lasix , and take Diamox  5500 mg for 2 days which she was compliant with.  Swelling and weight gain despite this. She denies chest pain, no difficulty breathing.   Recent hospitalization 9/19 to 9/26 also for decompensated CHF, was followed by heart failure team, discharged on Lasix  Entresto  and Aldactone .  SGLT2i-was discontinued due to uncontrolled diabetes.  She underwent right heart cath with endomyocardial biopsy, noted to have low filling pressures severely low cardiac index at 1.2, biopsy results with benign myocardium showing mild nonspecific cardial myocyte hyperplasia.  Negative for granulomas, limitation or any other infiltrative process.   ED Course: Temperature 98.1.  Heart rate 86-97.  Respirate rate 13-19.  Blood pressure systolic 136-156 O2 sats greater than 98% on room air. Pro-BNP elevated at 4538. Troponin 28. Chest x-ray shows mild diffuse reticular opacities bilaterally, suggesting mild interstitial edema. IV Lasix  40 mg x 1 given in ED.   Assessment and plan Acute on chronic combined systolic and diastolic heart failure -Patient remains fluid  overloaded  Intake/Output Summary (Last 24 hours) at 03/06/2024 1246 Last data filed at 03/06/2024 0900 Gross per 24 hour  Intake 1080 ml  Output 1900 ml  Net -820 ml    Filed Weights   03/05/24 0442 03/05/24 0812 03/06/24 0500  Weight: 65.2 kg 65.5 kg 64.4 kg  -Weight 248 >> 141 pounds - In no respite distress - Roughly 25 pounds gained since last discharge 9/25 echo: EF 35-40%, moderate LVH, grade 1 diastolic dysfunction - On IV Lasix  80 mg twice daily, increased by cardiology to 3 times daily Today.   -Appreciate cardiology following closely  -continue low-sodium diet, strict intake and output and daily weights will be continued. -REDs Clip measurements-inconclusive  2-hypertension - Continue losartan  and spironolactone . - Follow-up vital signs.  3-uncontrolled type 2 diabetes with hyperglycemia and hypoglycemia - Follow CBG fluctuation - Continue sliding scale insulin  and Long acting insulin . Dose adjusted to prevent low CBG's - Modified carbohydrate diet discussed with patient. - Recent A1c in the 12 range  CBG (last 3)  Recent Labs    03/06/24 0427 03/06/24 0719 03/06/24 1124  GLUCAP 245* 231* 129*     Physical Exam: Vitals:   03/05/24 0442 03/05/24 0812 03/05/24 1943 03/06/24 0500  BP:   133/77 127/64  Pulse:   76 80  Resp:    17  Temp:   98.4 F (36.9 C) 98.4 F (36.9 C)  TempSrc:   Oral Oral  SpO2:   100% 100%  Weight: 65.2 kg 65.5 kg  64.4 kg  Height:           General:  AAO x 3,  cooperative, no distress;   HEENT:  Normocephalic, PERRL, otherwise  with in Normal limits   Neuro:  CNII-XII intact. , normal motor and sensation, reflexes intact   Lungs:   Clear to auscultation BL, Respirations unlabored,  No wheezes / crackles  Cardio:    S1/S2, RRR, No murmure, No Rubs or Gallops   Abdomen:  Soft, non-tender, bowel sounds active all four quadrants, no guarding or peritoneal signs.  Muscular  skeletal:  Limited exam -global generalized  weaknesses - in bed, able to move all 4 extremities,   2+ pulses,  symmetric, +2  pitting edema  Skin:  Dry, warm to touch, negative for any Rashes,  Wounds: Please see nursing documentation         Data Reviewed:    Latest Ref Rng & Units 03/06/2024    4:47 AM 03/05/2024    4:40 AM 03/04/2024    3:23 AM  CMP  Glucose 70 - 99 mg/dL 718  44  860   BUN 6 - 20 mg/dL 14  16  17    Creatinine 0.44 - 1.00 mg/dL 9.42  9.47  9.41   Sodium 135 - 145 mmol/L 134  138  136   Potassium 3.5 - 5.1 mmol/L 5.1  4.4  4.7   Chloride 98 - 111 mmol/L 100  104  104   CO2 22 - 32 mmol/L 28  27  25    Calcium 8.9 - 10.3 mg/dL 8.3  8.3  8.4       Latest Ref Rng & Units 03/03/2024    3:45 PM 02/16/2024    9:31 AM 02/15/2024    5:29 PM  CBC  WBC 4.0 - 10.5 K/uL 10.9   10.6   Hemoglobin 12.0 - 15.0 g/dL 87.6  86.3    86.0  85.6   Hematocrit 36.0 - 46.0 % 37.5  40.0    41.0  42.4   Platelets 150 - 400 K/uL 375   226     Family Communication:  No family at bedside.  Disposition: Status is: Inpatient Remains inpatient appropriate because: Continue IV diuresis.  Anticipating discharge back home once medically stable.  Time spent: 50 minutes  Author: Adriana DELENA Grams, MD 03/06/2024 12:44 PM  For on call review [email or web address].

## 2024-03-06 NOTE — Progress Notes (Signed)
 BMI-20.9 does not meet criteria for red clip this morning.

## 2024-03-06 NOTE — Progress Notes (Addendum)
 Rounding Note   Patient Name: Christie Anderson Date of Encounter: 03/06/2024  [facility] Cardiologist: HF clinic  Subjective No complaints  Scheduled Meds:  enoxaparin  (LOVENOX ) injection  40 mg Subcutaneous QHS   insulin  aspart  0-5 Units Subcutaneous QHS   insulin  aspart  0-9 Units Subcutaneous TID WC   insulin  glargine  17 Units Subcutaneous QPM   losartan   50 mg Oral Daily   spironolactone   25 mg Oral Daily   Continuous Infusions:  PRN Meds: acetaminophen  **OR** acetaminophen , ondansetron  **OR** ondansetron  (ZOFRAN ) IV, polyethylene glycol   Vital Signs  Vitals:   03/05/24 0442 03/05/24 0812 03/05/24 1943 03/06/24 0500  BP:   133/77 127/64  Pulse:   76 80  Resp:    17  Temp:   98.4 F (36.9 C) 98.4 F (36.9 C)  TempSrc:   Oral Oral  SpO2:   100% 100%  Weight: 65.2 kg 65.5 kg  64.4 kg  Height:        Intake/Output Summary (Last 24 hours) at 03/06/2024 0918 Last data filed at 03/06/2024 0900 Gross per 24 hour  Intake 1440 ml  Output 3600 ml  Net -2160 ml      03/06/2024    5:00 AM 03/05/2024    8:12 AM 03/05/2024    4:42 AM  Last 3 Weights  Weight (lbs) 141 lb 15.6 oz 144 lb 6.4 oz 143 lb 11.8 oz  Weight (kg) 64.4 kg 65.5 kg 65.2 kg      Telemetry NSR - Personally Reviewed  ECG  N/a - Personally Reviewed  Physical Exam  GEN: No acute distress.   Neck: No JVD Cardiac: RRR, no murmurs, rubs, or gallops.  Respiratory: Clear to auscultation bilaterally. GI: Soft, nontender, non-distended  MS: 2+ bilateral LE edema Neuro:  Nonfocal  Psych: Normal affect   Labs High Sensitivity Troponin:   Recent Labs  Lab 02/09/24 1625 02/09/24 1943  TROPONINIHS 18* 18*     Chemistry Recent Labs  Lab 03/03/24 1545 03/04/24 0323 03/05/24 0440 03/06/24 0447  NA 138 136 138 134*  K 4.6 4.7 4.4 5.1  CL 103 104 104 100  CO2 25 25 27 28   GLUCOSE 115* 139* 44* 281*  BUN 16 17 16 14   CREATININE 0.72 0.58 0.52 0.57  CALCIUM 8.6* 8.4* 8.3*  8.3*  MG 2.4  --  2.4  --   PROT 6.6  --   --   --   ALBUMIN 3.2*  --   --   --   AST 38  --   --   --   ALT 44  --   --   --   ALKPHOS 116  --   --   --   BILITOT <0.2  --   --   --   GFRNONAA >60 >60 >60 >60  ANIONGAP 11 8 8 7     Lipids No results for input(s): CHOL, TRIG, HDL, LABVLDL, LDLCALC, CHOLHDL in the last 168 hours.  Hematology Recent Labs  Lab 03/03/24 1545  WBC 10.9*  RBC 3.95  HGB 12.3  HCT 37.5  MCV 94.9  MCH 31.1  MCHC 32.8  RDW 12.5  PLT 375   Thyroid No results for input(s): TSH, FREET4 in the last 168 hours.  BNP Recent Labs  Lab 03/03/24 1545  PROBNP 4,538.0*    DDimer No results for input(s): DDIMER in the last 168 hours.   Radiology  No results found.    Patient Profile  Christie Anderson is a 48 y.o. female with a hx of chronic HFrEF, DM2 who is being seen 03/04/2024 for the evaluation of SOB at the request of Dr Ricky.   Assessment & Plan   1.Acute on chronic HFrEF - new diagnosis of HFrEF during 01/2024 admission, diuresed 50 lbs 01/2024 echo: LVEF 35-40%, mod LVH, grade I dd, mild RV dysfunction 01/2024 cMRI: suboptimal study, LVE 32%, nonspecific delayed enhancement. No specific findings of amyloid though suboptimal imaging 01/2024 RHC/bx: mean PA 13, PCWP 6, CI 2.3 by Fick and 1.2 by thermodilution 01/2024 limited echo :LVEF 35-40% 01/2024 endomyocardial biopsy: benign myocardium, no amyloid.    -discharge wt 54.4, 120 lbs. This admit weight 148 lbs -CXR mild edema, proBNP 4538 - received IV lasix  80mg  x 2 yesterday. I/Os report negative 2.4L ,total I/Os for admit are incomplete. Weight 148-->141 lbs. Labs are stable.  - reds vest has been low quality - Remains fluid overloaded, increase IV lasix  to 80mg  tid today and reassess in AM. She really wants to go home but willing to stay another day.  - would plan to transition to oral torsemide at discharge.    - entresto  changed to losartan  at HF clinic due to cost -  jardiance  was stopped during prior admission due to severely poor contolled DM2 - beta blocker avoid  due to low CI - from last HF clinic note may need to consider advanced therapies in the near future - she has been effectively diuresing here and thus plan has been for ongoing diuresis with outpatient HF f/u. Suspect reason for exacerbation was home diuretic dosing was not sufficent to maintain euvolemia after recent admission.    For questions or updates, please contact [facility] Please consult [email or web address] for contact info under       Signed, Alvan Carrier, MD  03/06/2024, 9:18 AM

## 2024-03-06 NOTE — Plan of Care (Signed)

## 2024-03-07 ENCOUNTER — Other Ambulatory Visit (HOSPITAL_BASED_OUTPATIENT_CLINIC_OR_DEPARTMENT_OTHER): Payer: Self-pay

## 2024-03-07 ENCOUNTER — Other Ambulatory Visit: Payer: Self-pay

## 2024-03-07 ENCOUNTER — Other Ambulatory Visit (HOSPITAL_COMMUNITY): Payer: Self-pay

## 2024-03-07 LAB — GLUCOSE, CAPILLARY
Glucose-Capillary: 196 mg/dL — ABNORMAL HIGH (ref 70–99)
Glucose-Capillary: 184 mg/dL — ABNORMAL HIGH (ref 70–99)
Glucose-Capillary: 315 mg/dL — ABNORMAL HIGH (ref 70–99)

## 2024-03-07 LAB — BASIC METABOLIC PANEL WITH GFR
Anion gap: 7 (ref 5–15)
BUN: 14 mg/dL (ref 6–20)
CO2: 30 mmol/L (ref 22–32)
Calcium: 8.8 mg/dL — ABNORMAL LOW (ref 8.9–10.3)
Chloride: 96 mmol/L — ABNORMAL LOW (ref 98–111)
Creatinine, Ser: 0.57 mg/dL (ref 0.44–1.00)
GFR, Estimated: >60 mL/min (ref >60)
Glucose, Bld: 150 mg/dL — ABNORMAL HIGH (ref 70–99)
Potassium: 4.6 mmol/L (ref 3.5–5.1)
Sodium: 133 mmol/L — ABNORMAL LOW (ref 135–145)

## 2024-03-07 LAB — MAGNESIUM: Magnesium: 2.6 mg/dL — ABNORMAL HIGH (ref 1.7–2.4)

## 2024-03-07 LAB — PRO BRAIN NATRIURETIC PEPTIDE: Pro Brain Natriuretic Peptide: 6900 pg/mL — ABNORMAL HIGH (ref <300.0)

## 2024-03-07 MED ORDER — SPIRONOLACTONE 25 MG PO TABS
25.0000 mg | ORAL_TABLET | Freq: Every day | ORAL | 2 refills | Status: DC
  Filled 2024-03-07 – 2024-03-26 (×2): qty 30, 30d supply, fill #0

## 2024-03-07 MED ORDER — LOSARTAN POTASSIUM 50 MG PO TABS
50.0000 mg | ORAL_TABLET | Freq: Every day | ORAL | 2 refills | Status: DC
  Filled 2024-03-07 – 2024-03-26 (×2): qty 30, 30d supply, fill #0

## 2024-03-07 MED ORDER — INSULIN LISPRO (1 UNIT DIAL) 100 UNIT/ML (KWIKPEN)
8.0000 [IU] | PEN_INJECTOR | Freq: Three times a day (TID) | SUBCUTANEOUS | 0 refills | Status: DC
  Filled 2024-03-07: qty 21.6, 90d supply, fill #0
  Filled 2024-03-07: qty 15, 31d supply, fill #0

## 2024-03-07 MED ORDER — FUROSEMIDE 10 MG/ML IJ SOLN: 80.0000 mg | Freq: Three times a day (TID) | INTRAMUSCULAR | Status: DC

## 2024-03-07 MED ORDER — BASAGLAR KWIKPEN 100 UNIT/ML ~~LOC~~ SOPN
30.0000 [IU] | PEN_INJECTOR | Freq: Every evening | SUBCUTANEOUS | 3 refills | Status: AC
  Filled 2024-03-07: qty 27, 90d supply, fill #0
  Filled 2024-03-07: qty 9, 30d supply, fill #0

## 2024-03-07 MED ORDER — TORSEMIDE 20 MG PO TABS: 40.0000 mg | ORAL_TABLET | Freq: Two times a day (BID) | ORAL | Status: DC

## 2024-03-07 MED ORDER — TORSEMIDE 20 MG PO TABS
40.0000 mg | ORAL_TABLET | Freq: Two times a day (BID) | ORAL | 0 refills | Status: DC
  Filled 2024-03-07: qty 90, fill #0
  Filled 2024-03-07: qty 120, 30d supply, fill #0

## 2024-03-07 MED ORDER — POTASSIUM CHLORIDE CRYS ER 20 MEQ PO TBCR
10.0000 meq | EXTENDED_RELEASE_TABLET | Freq: Every day | ORAL | 0 refills | Status: DC
  Filled 2024-03-07 (×2): qty 30, 60d supply, fill #0

## 2024-03-07 NOTE — Progress Notes (Signed)
 Mobility Specialist Progress Note:    03/07/24 0850  Mobility  Activity Ambulated with assistance  Level of Assistance Independent  Assistive Device Front wheel walker  Distance Ambulated (ft) 1000 ft  Range of Motion/Exercises Active;All extremities  Activity Response Tolerated well  Mobility Referral Yes  Mobility visit 1 Mobility  Mobility Specialist Start Time (ACUTE ONLY) 0850  Mobility Specialist Stop Time (ACUTE ONLY) 0910  Mobility Specialist Time Calculation (min) (ACUTE ONLY) 20 min   Pt received sitting EOB, agreeable to mobility. Independently able to stand and ambulate with no AD. Tolerated well, asx throughout. Returned sitting EOB, all needs met.  Christie Anderson Mobility Specialist Please contact via Special educational needs teacher or  Rehab office at [phone]

## 2024-03-07 NOTE — Progress Notes (Signed)
 Rounding Note   Patient Name: Christie Anderson Date of Encounter: 03/07/2024  [facility] Cardiologist: HF clinic  Subjective Denies any CP or SOB. Adamant she wants to go home today  Scheduled Meds:  enoxaparin  (LOVENOX ) injection  40 mg Subcutaneous QHS   furosemide   80 mg Intravenous TID   insulin  aspart  0-5 Units Subcutaneous QHS   insulin  aspart  0-9 Units Subcutaneous TID WC   insulin  glargine  10 Units Subcutaneous QPM   losartan   50 mg Oral Daily   spironolactone   25 mg Oral Daily   Continuous Infusions:  PRN Meds: acetaminophen  **OR** acetaminophen , ondansetron  **OR** ondansetron  (ZOFRAN ) IV, polyethylene glycol   Vital Signs  Vitals:   03/06/24 2004 03/07/24 0130 03/07/24 0133 03/07/24 0526  BP: 123/74 120/70 111/70   Pulse: 87 87 87   Resp: 20 14 19    Temp: 98.5 F (36.9 C) 98.3 F (36.8 C)    TempSrc: Oral Oral    SpO2: 99% 100% 100%   Weight:    59.4 kg  Height:        Intake/Output Summary (Last 24 hours) at 03/07/2024 0831 Last data filed at 03/06/2024 1600 Gross per 24 hour  Intake 304.78 ml  Output --  Net 304.78 ml      03/07/2024    5:26 AM 03/06/2024    5:00 AM 03/05/2024    8:12 AM  Last 3 Weights  Weight (lbs) 130 lb 15.3 oz 141 lb 15.6 oz 144 lb 6.4 oz  Weight (kg) 59.4 kg 64.4 kg 65.5 kg      Telemetry NSR - Personally Reviewed  ECG  N/a - Personally Reviewed  Physical Exam  GEN: Well nourished, well developed in no acute distress HEENT: Normal NECK: No JVD; No carotid bruits LYMPHATICS: No lymphadenopathy CARDIAC:RRR, no murmurs, rubs, gallops RESPIRATORY:  Clear to auscultation without rales, wheezing or rhonchi  ABDOMEN: Soft, non-tender, non-distended MUSCULOSKELETAL:  1+ bilateral pedal edema; No deformity  SKIN: Warm and dry NEUROLOGIC:  Alert and oriented x 3 PSYCHIATRIC:  Normal affect   Labs High Sensitivity Troponin:   Recent Labs  Lab 02/09/24 1625 02/09/24 1943  TROPONINIHS 18* 18*      Chemistry Recent Labs  Lab 03/03/24 1545 03/04/24 0323 03/05/24 0440 03/06/24 0447 03/07/24 0428  NA 138   < > 138 134* 133*  K 4.6   < > 4.4 5.1 4.6  CL 103   < > 104 100 96*  CO2 25   < > 27 28 30   GLUCOSE 115*   < > 44* 281* 150*  BUN 16   < > 16 14 14   CREATININE 0.72   < > 0.52 0.57 0.57  CALCIUM 8.6*   < > 8.3* 8.3* 8.8*  MG 2.4  --  2.4  --  2.6*  PROT 6.6  --   --   --   --   ALBUMIN 3.2*  --   --   --   --   AST 38  --   --   --   --   ALT 44  --   --   --   --   ALKPHOS 116  --   --   --   --   BILITOT <0.2  --   --   --   --   GFRNONAA >60   < > >60 >60 >60  ANIONGAP 11   < > 8 7 7    < > =  values in this interval not displayed.    Lipids No results for input(s): CHOL, TRIG, HDL, LABVLDL, LDLCALC, CHOLHDL in the last 168 hours.  Hematology Recent Labs  Lab 03/03/24 1545  WBC 10.9*  RBC 3.95  HGB 12.3  HCT 37.5  MCV 94.9  MCH 31.1  MCHC 32.8  RDW 12.5  PLT 375   Thyroid No results for input(s): TSH, FREET4 in the last 168 hours.  BNP Recent Labs  Lab 03/03/24 1545  PROBNP 4,538.0*    DDimer No results for input(s): DDIMER in the last 168 hours.   Radiology  No results found.    Patient Profile   SHANON SEAWRIGHT is a 48 y.o. female with a hx of chronic HFrEF, DM2 who is being seen 03/04/2024 for the evaluation of SOB at the request of Dr Ricky.   Assessment & Plan   Acute on chronic HFrEF - new diagnosis of HFrEF during 01/2024 admission, diuresed 50 lbs 01/2024 echo: LVEF 35-40%, mod LVH, grade I dd, mild RV dysfunction 01/2024 cMRI: suboptimal study, LVE 32%, nonspecific delayed enhancement. No specific findings of amyloid though suboptimal imaging 01/2024 RHC/bx: mean PA 13, PCWP 6, CI 2.3 by Fick and 1.2 by thermodilution 01/2024 limited echo :LVEF 35-40% 01/2024 endomyocardial biopsy: benign myocardium, no amyloid.  - discharge wt 54.4, 120 lbs. This admit weight 148 lbs - CXR mild edema, proBNP 4538 - Currently Lasix  80  mg IV 3 times daily; I/Os complete; Weight 148-->141--> 131 lbs - SCr 0.57; K+ 4.6 - reds vest has been low quality - has minimal pedal edema on exam - she is adamant that she wants to go home today which I think is ok - will send home on Torsemide 40mg  BID - BMET at time of AHF clinic appt 10/21  - entresto  changed to losartan  at HF clinic due to cost - jardiance  was stopped during prior admission due to severely poor contolled DM2 - No beta-blocker due to significantly reduced cardiac index - from last HF clinic note may need to consider advanced therapies in the near future - Suspect reason for exacerbation was home diuretic dosing was not sufficent to maintain euvolemia after recent admission.  - she has an AHF clinic appt on 10/21  [facility] will sign off.   Medication Recommendations: Losartan  50 mg daily, spironolactone  25 mg daily, torsemide 40 mg twice daily Other recommendations (labs, testing, etc): BMET 03/12/24 Follow up as an outpatient: Advanced heart failure clinic 03/12/24  I spent 35 minutes caring for this patient today face to face, ordering and reviewing labs, reviewing records from 2D echo 02/16/2024, seeing the patient, documenting in the record  For questions or updates, please contact [facility] Please consult [email or web address] for contact info under       Signed, Wilbert Bihari, MD  03/07/2024, 8:31 AM

## 2024-03-07 NOTE — Progress Notes (Addendum)
 Patient c/o feeling like passing out while in the bathroom.  Patient had BM, with no straining prior to the event.  Blood glucose 196, and vitals as follows:   03/07/24 0130 03/07/24 0133  Vitals  Temp 98.3 F (36.8 C)  --   Temp Source Oral  --   BP 120/70 111/70  MAP (mmHg)  --  81  Patient Position (if appropriate) Sitting Standing  Pulse Rate 87 87  ECG Heart Rate 83 84  Resp 14 19  Level of Consciousness  Level of Consciousness Alert Alert  Oxygen Therapy  SpO2 100 % 100 %  O2 Device Room [employer]

## 2024-03-07 NOTE — TOC Transition Note (Signed)
 Transition of Care [facility]) - Discharge Note   Patient Details  Name: Christie Anderson MRN: [id number] Date of Birth: 11/06/1975  [facility]) CM/SW Contact:  Sharlyne Stabs, RN Phone Number: 03/07/2024, 12:14 PM   Clinical Narrative:   Patient is discharging home. No insurance, no PCP. CM at the bedside to explain [facility] program, she is agreeable. Handout given, referral called in and added to AVS.  Team updated.    Barriers to Discharge: Barriers Resolved   Patient Goals and CMS Choice    Discharge Placement         Patient and family notified of of transfer: 03/07/24  Discharge Plan and Services Additional resources added to the After Visit Summary for     Social Drivers of Health (SDOH) Interventions SDOH Screenings   Food Insecurity: Food Insecurity Present (03/03/2024)  Housing: Low Risk  (03/03/2024)  Transportation Needs: No Transportation Needs (03/03/2024)  Utilities: Not At Risk (03/03/2024)  Depression (PHQ2-9): Medium Risk (02/21/2024)  Financial Resource Strain: Medium Risk (02/26/2024)  Tobacco Use: High Risk (03/03/2024)   Readmission Risk Interventions    03/04/2024    8:43 AM  Readmission Risk Prevention Plan  Post Dischage Appt Complete  Medication Screening Complete  Transportation Screening Complete

## 2024-03-07 NOTE — Inpatient Diabetes Management (Addendum)
 Inpatient Diabetes Program Recommendations  AACE/ADA: New Consensus Statement on Inpatient Glycemic Control  Target Ranges:  Prepandial:   less than 140 mg/dL      Peak postprandial:   less than 180 mg/dL (1-2 hours)      Critically ill patients:  140 - 180 mg/dL   Lab Results  Component Value Date   GLUCAP 184 (H) 03/07/2024   HGBA1C 12.6 (H) 02/13/2024    Review of Glycemic Control   Latest Reference Range & Units 03/06/24 07:19 03/06/24 11:24 03/06/24 12:50 03/06/24 16:25 03/06/24 20:02 03/07/24 01:28 03/07/24 07:44  Glucose-Capillary 70 - 99 mg/dL 768 (H) 870 (H) 863 (H) 397 (H) 239 (H) 196 (H) 184 (H)    Diabetes history: DM2 Outpatient Diabetes medications:  Basaglar  30 units at bedtime  Humalog  8 units TID   Current orders for Inpatient glycemic control:  Lantus  10 units daily  Novolog  0-9 units TID + 0-5 units at bedtime.   Inpatient Diabetes Program Recommendations:   Please consider starting Novolog  3 units TID with meals (if patient consume >50% of meal)   Order for [facility] consult placed.   Addendum @ 10:00am:  Spoke with patient at bedside about diabetes and home regimen for diabetes control. Discussed A1C results 12.6% and explained that current A1C indicates an average glucose of 315 mg/dl over the past 2-3 months. Discussed glucose and A1C goals. Discussed importance of checking CBGs and maintaining good CBG control to prevent long-term and short-term complications. Explained how hyperglycemia leads to damage within blood vessels which lead to the common complications seen with uncontrolled diabetes. Discussed impact of nutrition, exercise, stress, sickness, and medications on diabetes control. Discussed carbohydrates, carbohydrate goals per day and meal, along with portion sizes.  Patient states prior to admission, she checked her CBG 3-4 times per day and was taking Basaglar  30 units at bedtime but did not take her Humalog  consistently. She explains how the ISS is  confusing for her so she typically give 4-8 units TID depending on her blood sugar  - If my blood sugar is less than 300 mg/dl, I don't give any Humalog  because I don't want to go low.  I provided further education and made her aware a fasting blood sugar should be between 80-130 mg/dl and postprandial should be <180 mg/dl. I also reiterated the importance of controlling her blood sugar and the complications that are associated with hyperglycemia. I encouraged patient to use the Insulin  Sliding Scale.   She also expressed the last time she went to pick up her Basglar and Humalog  from the pharmacy is was too expensive and she will not be able to afford this in the future. Basaglar  was over $170 anHumalog  was over $70.   I reached out to [facility] and spoke with Burnard. Patient is eligible for the [facility] program. Patient's Basaglar  and Humalog  will be free at the [facility] - [address], [place], [place] [address].   Confirmed with Dr Willette to provide pt with Freestyle Libre3 samples.  Educated patient on FreeStyle Libre3 CGM regarding application and changing CGM sensor (alternate every 15 days on back of arms), 1 hour warm-up, how to scan sensor to start a new sensor, and how to use app to check glucose. Patient has been given Freestyle Libre3 sensor samples.  Provided educational packet regarding FreeStyle Libre3 CGM.  Patient using iphone FreeStyle Libre3 app to read FreeStyle Libre sensor and was able to download the app  and create an account. Placed FreeStyle Libre sensor on patient's left upper arm. Informed patient that it would be requested that attending provider provide Rx for first month of FreeStyle Libre3 sensors and that she will need PCP to continue to provide Rx for FreeStyle Libre3 sensors going forward. Asked patient to be sure to let PCP know about Liane and allow provider to review reports from [place] app so the provider can  use the information to continue to make adjustments with DM medications if needed. Patient verbalized understanding of information and has no questions at this time.   Discharge Recommendations: Other recommendations: FreeStyle Libre3+ (#[id number]) Please send RX to [facility]: Patient eligible for [facility] Long acting recommendations: Insulin  Glargine (BASAGLAR ) Kwikpen To be determined  Short acting recommendations:  Meal + Correction coverage Insulin  lispro (HUMALOG ) KwikPen  Sensitive Scale.  To be determined Hypoglycemia treatment recommendations: GVoke 1mg  Supply/Referral recommendations: Glucometer Test strips Lancet device Lancets Pen needles - standard   Thanks,  Lavanda Search, RN, MSN, [facility]  Inpatient Diabetes Coordinator  Pager [phone] (8a-5p)

## 2024-03-07 NOTE — Discharge Summary (Signed)
 Physician Discharge Summary   Patient: Christie Anderson MRN: [id number] DOB: [birth year]  Admit date:     03/03/2024  Discharge date: 03/07/24  Discharge Physician: Adriana DELENA Grams   PCP: Mancil Pfeiffer, [place]   Recommendations at discharge:   Follow with the PCP in 1 week Follow-up with your cardiologist as scheduled - Follow-up with the heart failure team as scheduled - BMP, BNP in 1 week result to PCP and cardiologist  Discharge Diagnoses: Principal Problem:   Acute on chronic combined systolic (congestive) and diastolic (congestive) heart failure (HCC) Active Problems:   Essential hypertension   Type 2 diabetes mellitus with hyperglycemia, with long-term current use of insulin  (HCC)      Christie Anderson is a 48 y.o. female with medical history significant for systolic and diastolic CHF, hypertension, diabetes mellitus. Patient presented to the ED with complaints of bilateral lower extremity swelling, abdominal bloating and 25 pound weight gain that has gradually progressed since she was discharged from the hospital 9/26.  On discharge her weight was 119, she is weighing about 144 today. She followed up with her outpatient providers and was told to increase her dose of Lasix , and take Diamox  5500 mg for 2 days which she was compliant with.  Swelling and weight gain despite this. She denies chest pain, no difficulty breathing.   Recent hospitalization 9/19 to 9/26 also for decompensated CHF, was followed by heart failure team, discharged on Lasix  Entresto  and Aldactone .  SGLT2i-was discontinued due to uncontrolled diabetes.  She underwent right heart cath with endomyocardial biopsy, noted to have low filling pressures severely low cardiac index at 1.2, biopsy results with benign myocardium showing mild nonspecific cardial myocyte hyperplasia.  Negative for granulomas, limitation or any other infiltrative process.   ED Course: Temperature 98.1.  Heart rate 86-97.  Respirate rate  13-19.  Blood pressure systolic 136-156 O2 sats greater than 98% on room air. Pro-BNP elevated at 4538. Troponin 28. Chest x-ray shows mild diffuse reticular opacities bilaterally, suggesting mild interstitial edema. IV Lasix  40 mg x 1 given in ED.    Acute on chronic combined systolic and diastolic heart failure Much improved with aggressive IV diuretics, medication changed to oral Cardiology:  - Torsemide 40mg  BID - BMET at time of AHF clinic appt 10/21  - entresto  changed to losartan  at HF clinic due to cost - jardiance  was stopped during prior admission due to severely poor contolled DM2 - No beta-blocker due to significantly reduced cardiac index  Filed Weights   03/05/24 0812 03/06/24 0500 03/07/24 0526  Weight: 65.5 kg 64.4 kg 59.4 kg       9/25 echo: EF 35-40%, moderate LVH, grade 1 diastolic dysfunction - On IV Lasix  80 mg twice daily,>> switch to p.o. torsemide 40 mg twice daily     -continue low-sodium diet, strict intake and output and daily weights will be continued. -REDs Clip measurements-inconclusive   2-hypertension - Continue losartan  and spironolactone . - Follow-up vital signs.   3-uncontrolled type 2 diabetes with hyperglycemia and hypoglycemia - Due to financial difficulty, resuming home insulin  regimen as adjusted dose for tighter glycemic control - Modified carbohydrate diet discussed with patient. - Recent A1c in the 12 range         Consultants: Cardiology/diabetic coordinator Procedures performed: 2D echocardiogram Disposition: Home Diet recommendation:  Discharge Diet Orders (From admission, onward)     Start     Ordered   03/07/24 0000  Diet - low sodium heart healthy  03/07/24 1111   03/07/24 0000  Diet Carb Modified        03/07/24 1111           Diabetic/cardiac diet DISCHARGE MEDICATION: Allergies as of 03/07/2024       Reactions   Bovine (beef) Protein-containing Drug Products Hives, Swelling   Porcine  (pork) Protein-containing Drug Products Hives, Swelling   Amoxicillin Other (See Comments)   Unknown    Doxycycline Nausea And Vomiting   Penicillins Other (See Comments)   Unknown         Medication List     STOP taking these medications    acetaZOLAMIDE  250 MG tablet Commonly known as: DIAMOX    furosemide  40 MG tablet Commonly known as: LASIX        TAKE these medications    Accu-Chek Guide Test test strip Generic drug: glucose blood Use 3 (three) times daily as directed to check blood sugar.   Accu-Chek Guide w/Device Kit Use 3 (three) times daily.   Accu-Chek Softclix Lancets lancets Use 3 (three) times daily as directed to check blood sugar.   Basaglar  KwikPen 100 UNIT/ML Inject 30 Units into the skin every evening. May substitute as needed per insurance.   insulin  lispro 100 UNIT/ML KwikPen Commonly known as: HUMALOG  Inject 8 Units into the skin 3 (three) times daily. Pt is taking 4-16 units based on blood sugar (sliding scale) pt does not have scale in front of her to read scale   INSULIN  SYRINGE .5CC/30GX5/16 30G X 5/16 0.5 ML Misc Use one syringe and needle per insulin  injection.   Lancet Device Misc 1 each by Does not apply route 3 (three) times daily. May dispense any manufacturer covered by patient's insurance.   losartan  50 MG tablet Commonly known as: COZAAR  Take 1 tablet (50 mg total) by mouth daily.   potassium chloride  SA 20 MEQ tablet Commonly known as: KLOR-CON  M Take 0.5 tablets (10 mEq total) by mouth daily. What changed: how much to take   spironolactone  25 MG tablet Commonly known as: ALDACTONE  Take 1 tablet (25 mg total) by mouth daily.   TechLite Pen Needles 32G X 4 MM Misc Generic drug: Insulin  Pen Needle Use 3 (three) times daily.   Torsemide 40 MG Tabs Take 40 mg by mouth 2 (two) times daily. Start taking on: March 08, 2024        Follow-up Information     [facility].  Go on 03/12/2024.   Specialty: Cardiology Why: Hospital Follow-Up on 03/12/24 @ 2:30 Please bring all medications to follow-up appointment. [facility] [address] [facility] Parking at the [employer] or [employer] code 1430 to park under the building. Contact information: [address] [place]  [address] [phone]               Discharge Exam: Fredricka Weights   03/05/24 9187 03/06/24 0500 03/07/24 0526  Weight: 65.5 kg 64.4 kg 59.4 kg        General:  AAO x 3,  cooperative, no distress;   HEENT:  Normocephalic, PERRL, otherwise with in Normal limits   Neuro:  CNII-XII intact. , normal motor and sensation, reflexes intact   Lungs:   Clear to auscultation BL, Respirations unlabored,  No wheezes / crackles  Cardio:    S1/S2, RRR, No murmure, No Rubs or Gallops   Abdomen:  Soft, non-tender, bowel sounds active all four quadrants, no guarding or peritoneal signs.  Muscular  skeletal:  Limited exam -global generalized weaknesses - in bed, able to move all 4 extremities,   2+ pulses,  symmetric, +1 pitting edema  Skin:  Dry, warm to touch, negative for any Rashes,  Wounds: Please see nursing documentation          Condition at discharge: good  The results of significant diagnostics from this hospitalization (including imaging, microbiology, ancillary and laboratory) are listed below for reference.   Imaging Studies: DG Chest Port 1 View Result Date: 03/03/2024 CLINICAL DATA:  Swelling to the abdomen, legs and face. EXAM: PORTABLE CHEST 1 VIEW COMPARISON:  February 14, 2024 FINDINGS: The heart size and mediastinal contours are within normal limits. Mild diffuse reticular opacities are noted throughout both lungs. No focal consolidation, pleural effusion or pneumothorax is identified. The visualized skeletal structures are unremarkable. IMPRESSION: Mild diffuse reticular opacities throughout both lungs, which may represent  mild interstitial edema. Electronically Signed   By: Suzen Dials M.D.   On: 03/03/2024 16:16   ECHOCARDIOGRAM LIMITED Result Date: 02/16/2024    ECHOCARDIOGRAM LIMITED REPORT   Patient Name:   MITCHELLE SULTAN Date of Exam: 02/16/2024 Medical Rec #:  [id number]      Height:       69.0 in Accession #:    [id number]     Weight:       119.9 lb Date of Birth:  1975-10-31      BSA:          1.662 m Patient Age:    48 years       BP:           112/72 mmHg Patient Gender: F              HR:           69 bpm. Exam Location:  Inpatient Procedure: Limited Echo and Color Doppler (Both Spectral and Color Flow Doppler            were utilized during procedure). Indications:    Endomyocardial Biopsy  History:        Patient has prior history of Echocardiogram examinations, most                 recent 02/10/2024. CHF; Risk Factors:Diabetes.  Sonographer:    Philomena Daring Referring Phys: [id number] ADITYA SABHARWAL IMPRESSIONS  1. Left ventricular ejection fraction, by estimation, is 35 to 40%. The left ventricle has moderately decreased function.  2. Myocardial biopsy catheter noted transiently in RV.  3. Trivial effusion noted pre procedure. FINDINGS  Left Ventricle: Left ventricular ejection fraction, by estimation, is 35 to 40%. The left ventricle has moderately decreased function. Right Ventricle: Myocardial biopsy catheter noted transiently in RV. Pericardium: Trivial effusion noted pre procedure. Trivial pericardial effusion is present. Additional Comments: Color Doppler performed.  Oneil Parchment MD Electronically signed by Oneil Parchment MD Signature Date/Time: 02/16/2024/11:35:24 AM    Final    CARDIAC CATHETERIZATION Result Date: 02/16/2024 HEMODYNAMICS: RA:   3 mmHg (mean) RV:   19/5 mmHg PA:   17/10 mmHg (13 mean) PCWP:  6 mmHg (mean)    Estimated Fick CO/CI   3.9 L/min, 2.28 L/min/m2 Thermodilution CO/CI   2.3 L/min, 1.2 L/min/m2    TPG    7  mmHg     PVR     1.8-3 Wood Units PAPi      2.5  Post biopsy RA:  IMPRESSION: Low pre and post capillary filling pressures Severely reduced cardiac index  by TD; moderately reduced by TD Three RV endomyocardial samples obtained under echo guidance. No pericardial effusion or TR pre or post procedure. Aditya Sabharwal 10:05 AM  MR CARDIAC MORPHOLOGY W WO CONTRAST Result Date: 02/15/2024 CLINICAL DATA:  Heart failure, systolic, determine etiology EXAM: MR CARDIA MORPHOLOGY WITHOUT AND WITH CONTRAST; MR CARDIAC VELOCITY FLOW MAPPING TECHNIQUE: The patient was scanned on a 1.5 Tesla Siemens magnet. A dedicated cardiac coil was used. Functional imaging was done using TrueFisp sequences. 2,3, and 4 chamber views were done to assess for RWMA's. Modified Simpson's rule using a short axis stack was used to calculate an ejection fraction on a dedicated work [employer]. The patient received 6mL GADAVIST  GADOBUTROL  1 MMOL/ML IV SOLN. After 10 minutes inversion recovery sequences were used to assess for infiltration and scar tissue. Phase contrast velocity encoded images obtained x 2. This examination is tailored for evaluation cardiac anatomy and function and provides very limited assessment of noncardiac structures, which are accordingly not evaluated during interpretation. If there is clinical concern for extracardiac pathology, further evaluation with CT imaging should be considered. FINDINGS: LEFT VENTRICLE: Left ventricular chamber size: Mildly dilated. Left ventricular wall thickness: Moderately increased. Maximal wall thickness 14 mm Left ventricular systolic function: Moderately reduced LVEF = 32% Global hypokinesis. No myocardial edema, T2 = 55 msec Normal first pass perfusion. There is post contrast delayed myocardial enhancement: Midmyocardial stripe of delayed enhancement in the basal septum, nonspecific finding. Normal T1 myocardial nulling kinetics suggest against a diagnosis of cardiac amyloidosis. ECV = 32% RIGHT VENTRICLE: Normal right ventricular chamber  size. Normal right ventricular wall thickness. Mildly reduced right ventricular systolic function. RVEF = 39% There are no regional wall motion abnormalities. Global hypokinesis. No post contrast delayed myocardial enhancement. ATRIA: Left atrium: Mildly dilated Right atrium: normal PERICARDIUM: Normal pericardium.  Small pericardial effusion. OTHER: No significant extracardiac findings. MEASUREMENTS: Qp/Qs: Not reported due to image quality. VALVES: Tricuspid aortic valve. Aortic valve regurgitation: Mild, regurgitant fraction 5% Pulmonary valve regurgitation: Trace, regurgitant fraction <1% Mitral valve regurgitation: Trace, regurgitant fraction <1% Tricuspid valve regurgitation: Poor quality phase contrast images, not able to be assessed. Left ventricle: LV female LV EF: 32 % (Normal 52-79%) Absolute volumes: LV EDV: (Normal 78-167 mL) LV ESV: (Normal 21-64 mL) LV SV: 64mL (Normal 52-114 mL) CO: 4.2L/min (Normal 2.7-6.3 L/min) Indexed volumes: CI: 2.5L/min/sq-m (normal 1.9-3.9 L/min/sq-m) LV EDV: 130mL/sq-m (Normal 50-96 mL/sq-m) LV ESV: 55mL/sq-m (Normal 10-40 mL/sq-m) LV SV: 60mL/sq-m (Normal 33-64 mL/sq-m) Right ventricle: RV female RV EF: 39% (Normal 52-80%) Absolute volumes: RV EDV: (Normal 79-175 mL) RV ESV: 81mL (Normal 13-75 mL) RV SV: 51mL (Normal 56-110 mL) CO: 3.4L/min (Normal 2.7-6 L/min) Indexed volumes: CI: 2.0L/min/sq-m (normal 1.8-3.8 L/min/sq-m) RV EDV: 50mL/sq-m (normal 51-97 mL/sq-m) RV ESV: 38mL/sq-m (Normal 9-42 mL/sq-m) RV SV: 26mL/sq-m (normal 35-61 mL/sq-m) IMPRESSION: 1. Suboptimal image quality affects quality of delayed enhancement images. 2. Moderately reduced left ventricular systolic function with mild dilation, LVEF 32%. Nonspecific pattern of delayed enhancement with midwall septal scar, which can be seen in nonischemic cardiomyopathy. 3. ECV with nonspecific elevation, 32%. No definite findings of cardiac amyloidosis. Given suboptimal image quality, would  consider PYP scan and myeloma labs to definitively evaluate for amyloidosis. 4. Mildly reduced RV systolic function with normal RV chamber size. RVEF 39%. No delayed myocardial enhancement. 5.  Moderate right pleural effusion Electronically Signed   By: Soyla Merck M.D.   On: 02/15/2024 14:24   MR CARDIAC VELOCITY FLOW MAP Result  Date: 02/15/2024 CLINICAL DATA:  Heart failure, systolic, determine etiology EXAM: MR CARDIA MORPHOLOGY WITHOUT AND WITH CONTRAST; MR CARDIAC VELOCITY FLOW MAPPING TECHNIQUE: The patient was scanned on a 1.5 Tesla Siemens magnet. A dedicated cardiac coil was used. Functional imaging was done using TrueFisp sequences. 2,3, and 4 chamber views were done to assess for RWMA's. Modified Simpson's rule using a short axis stack was used to calculate an ejection fraction on a dedicated work [employer]. The patient received 6mL GADAVIST  GADOBUTROL  1 MMOL/ML IV SOLN. After 10 minutes inversion recovery sequences were used to assess for infiltration and scar tissue. Phase contrast velocity encoded images obtained x 2. This examination is tailored for evaluation cardiac anatomy and function and provides very limited assessment of noncardiac structures, which are accordingly not evaluated during interpretation. If there is clinical concern for extracardiac pathology, further evaluation with CT imaging should be considered. FINDINGS: LEFT VENTRICLE: Left ventricular chamber size: Mildly dilated. Left ventricular wall thickness: Moderately increased. Maximal wall thickness 14 mm Left ventricular systolic function: Moderately reduced LVEF = 32% Global hypokinesis. No myocardial edema, T2 = 55 msec Normal first pass perfusion. There is post contrast delayed myocardial enhancement: Midmyocardial stripe of delayed enhancement in the basal septum, nonspecific finding. Normal T1 myocardial nulling kinetics suggest against a diagnosis of cardiac amyloidosis. ECV = 32% RIGHT VENTRICLE:  Normal right ventricular chamber size. Normal right ventricular wall thickness. Mildly reduced right ventricular systolic function. RVEF = 39% There are no regional wall motion abnormalities. Global hypokinesis. No post contrast delayed myocardial enhancement. ATRIA: Left atrium: Mildly dilated Right atrium: normal PERICARDIUM: Normal pericardium.  Small pericardial effusion. OTHER: No significant extracardiac findings. MEASUREMENTS: Qp/Qs: Not reported due to image quality. VALVES: Tricuspid aortic valve. Aortic valve regurgitation: Mild, regurgitant fraction 5% Pulmonary valve regurgitation: Trace, regurgitant fraction <1% Mitral valve regurgitation: Trace, regurgitant fraction <1% Tricuspid valve regurgitation: Poor quality phase contrast images, not able to be assessed. Left ventricle: LV female LV EF: 32 % (Normal 52-79%) Absolute volumes: LV EDV: (Normal 78-167 mL) LV ESV: (Normal 21-64 mL) LV SV: 64mL (Normal 52-114 mL) CO: 4.2L/min (Normal 2.7-6.3 L/min) Indexed volumes: CI: 2.5L/min/sq-m (normal 1.9-3.9 L/min/sq-m) LV EDV: 196mL/sq-m (Normal 50-96 mL/sq-m) LV ESV: 3mL/sq-m (Normal 10-40 mL/sq-m) LV SV: 19mL/sq-m (Normal 33-64 mL/sq-m) Right ventricle: RV female RV EF: 39% (Normal 52-80%) Absolute volumes: RV EDV: (Normal 79-175 mL) RV ESV: 81mL (Normal 13-75 mL) RV SV: 51mL (Normal 56-110 mL) CO: 3.4L/min (Normal 2.7-6 L/min) Indexed volumes: CI: 2.0L/min/sq-m (normal 1.8-3.8 L/min/sq-m) RV EDV: 79mL/sq-m (normal 51-97 mL/sq-m) RV ESV: 64mL/sq-m (Normal 9-42 mL/sq-m) RV SV: 37mL/sq-m (normal 35-61 mL/sq-m) IMPRESSION: 1. Suboptimal image quality affects quality of delayed enhancement images. 2. Moderately reduced left ventricular systolic function with mild dilation, LVEF 32%. Nonspecific pattern of delayed enhancement with midwall septal scar, which can be seen in nonischemic cardiomyopathy. 3. ECV with nonspecific elevation, 32%. No definite findings of cardiac amyloidosis. Given  suboptimal image quality, would consider PYP scan and myeloma labs to definitively evaluate for amyloidosis. 4. Mildly reduced RV systolic function with normal RV chamber size. RVEF 39%. No delayed myocardial enhancement. 5.  Moderate right pleural effusion Electronically Signed   By: Soyla Merck M.D.   On: 02/15/2024 14:24   MR CARDIAC VELOCITY FLOW MAP Result Date: 02/15/2024 CLINICAL DATA:  Heart failure, systolic, determine etiology EXAM: MR CARDIA MORPHOLOGY WITHOUT AND WITH CONTRAST; MR CARDIAC VELOCITY FLOW MAPPING TECHNIQUE: The patient was scanned on a 1.5 Tesla Siemens magnet. A  dedicated cardiac coil was used. Functional imaging was done using TrueFisp sequences. 2,3, and 4 chamber views were done to assess for RWMA's. Modified Simpson's rule using a short axis stack was used to calculate an ejection fraction on a dedicated work [employer] officer, [employer]. The patient received 6mL GADAVIST  GADOBUTROL  1 MMOL/ML IV SOLN. After 10 minutes inversion recovery sequences were used to assess for infiltration and scar tissue. Phase contrast velocity encoded images obtained x 2. This examination is tailored for evaluation cardiac anatomy and function and provides very limited assessment of noncardiac structures, which are accordingly not evaluated during interpretation. If there is clinical concern for extracardiac pathology, further evaluation with CT imaging should be considered. FINDINGS: LEFT VENTRICLE: Left ventricular chamber size: Mildly dilated. Left ventricular wall thickness: Moderately increased. Maximal wall thickness 14 mm Left ventricular systolic function: Moderately reduced LVEF = 32% Global hypokinesis. No myocardial edema, T2 = 55 msec Normal first pass perfusion. There is post contrast delayed myocardial enhancement: Midmyocardial stripe of delayed enhancement in the basal septum, nonspecific finding. Normal T1 myocardial nulling kinetics suggest against a diagnosis of cardiac  amyloidosis. ECV = 32% RIGHT VENTRICLE: Normal right ventricular chamber size. Normal right ventricular wall thickness. Mildly reduced right ventricular systolic function. RVEF = 39% There are no regional wall motion abnormalities. Global hypokinesis. No post contrast delayed myocardial enhancement. ATRIA: Left atrium: Mildly dilated Right atrium: normal PERICARDIUM: Normal pericardium.  Small pericardial effusion. OTHER: No significant extracardiac findings. MEASUREMENTS: Qp/Qs: Not reported due to image quality. VALVES: Tricuspid aortic valve. Aortic valve regurgitation: Mild, regurgitant fraction 5% Pulmonary valve regurgitation: Trace, regurgitant fraction <1% Mitral valve regurgitation: Trace, regurgitant fraction <1% Tricuspid valve regurgitation: Poor quality phase contrast images, not able to be assessed. Left ventricle: LV female LV EF: 32 % (Normal 52-79%) Absolute volumes: LV EDV: (Normal 78-167 mL) LV ESV: (Normal 21-64 mL) LV SV: 64mL (Normal 52-114 mL) CO: 4.2L/min (Normal 2.7-6.3 L/min) Indexed volumes: CI: 2.5L/min/sq-m (normal 1.9-3.9 L/min/sq-m) LV EDV: 153mL/sq-m (Normal 50-96 mL/sq-m) LV ESV: 61mL/sq-m (Normal 10-40 mL/sq-m) LV SV: 57mL/sq-m (Normal 33-64 mL/sq-m) Right ventricle: RV female RV EF: 39% (Normal 52-80%) Absolute volumes: RV EDV: (Normal 79-175 mL) RV ESV: 81mL (Normal 13-75 mL) RV SV: 51mL (Normal 56-110 mL) CO: 3.4L/min (Normal 2.7-6 L/min) Indexed volumes: CI: 2.0L/min/sq-m (normal 1.8-3.8 L/min/sq-m) RV EDV: 27mL/sq-m (normal 51-97 mL/sq-m) RV ESV: 18mL/sq-m (Normal 9-42 mL/sq-m) RV SV: 7mL/sq-m (normal 35-61 mL/sq-m) IMPRESSION: 1. Suboptimal image quality affects quality of delayed enhancement images. 2. Moderately reduced left ventricular systolic function with mild dilation, LVEF 32%. Nonspecific pattern of delayed enhancement with midwall septal scar, which can be seen in nonischemic cardiomyopathy. 3. ECV with nonspecific elevation, 32%. No definite  findings of cardiac amyloidosis. Given suboptimal image quality, would consider PYP scan and myeloma labs to definitively evaluate for amyloidosis. 4. Mildly reduced RV systolic function with normal RV chamber size. RVEF 39%. No delayed myocardial enhancement. 5.  Moderate right pleural effusion Electronically Signed   By: Soyla Merck M.D.   On: 02/15/2024 14:24   DG CHEST PORT 1 VIEW Result Date: 02/14/2024 CLINICAL DATA:  Hypoxia EXAM: PORTABLE CHEST 1 VIEW COMPARISON:  February 09, 2024 FINDINGS: Interval placement of right-sided PICC line with tip at the cavoatrial junction. Improved aeration of the lungs with likely decrease in size of small right pleural effusion and likely resolution of left pleural effusion. Persistent left basilar consolidation/atelectasis. Cardiomediastinal silhouette is within normal limits. No acute osseous findings. IMPRESSION: Interval placement of right  upper extremity PICC line with tip at the cavoatrial junction. Interval decrease in size of small right pleural effusion and resolution of left pleural effusion. Improved aeration of the lung bases with persistent likely right lung base atelectasis. Electronically Signed   By: Michaeline Blanch M.D.   On: 02/14/2024 15:01   US  EKG SITE RITE Result Date: 02/13/2024 If Site Rite image not attached, placement could not be confirmed due to current cardiac rhythm.  ECHOCARDIOGRAM COMPLETE Result Date: 02/10/2024    ECHOCARDIOGRAM REPORT   Patient Name:   ZARYIA MARKEL Date of Exam: 02/10/2024 Medical Rec #:  [id number]      Height:       69.0 in Accession #:    [id number]     Weight:       179.5 lb Date of Birth:  November 17, 1975      BSA:          1.973 m Patient Age:    48 years       BP:           152/99 mmHg Patient Gender: F              HR:           83 bpm. Exam Location:  [facility] Procedure: 2D Echo, Cardiac Doppler, Color Doppler and Strain Analysis (Both            Spectral and Color Flow Doppler were utilized during  procedure). Indications:    I50.40* Unspecified combined systolic (congestive) and diastolic                 (congestive) heart failure  History:        Patient has no prior history of Echocardiogram examinations.                 CHF; Risk Factors:Diabetes.  Sonographer:    Ellouise Mose RDCS Referring Phys: [phone] JACOB JINNY PEEL  Sonographer Comments: Delay to get patient in bed. IMPRESSIONS  1. Left ventricular ejection fraction, by estimation, is 35 to 40%. The left ventricle has moderately decreased function. The left ventricle demonstrates global hypokinesis. There is moderate concentric left ventricular hypertrophy. Left ventricular diastolic parameters are consistent with Grade I diastolic dysfunction (impaired relaxation). Elevated left atrial pressure. The average left ventricular global longitudinal strain is -13.5 %. The global longitudinal strain is abnormal.  2. Right ventricular systolic function is mildly reduced. The right ventricular size is normal. Tricuspid regurgitation signal is inadequate for assessing PA pressure.  3. Left atrial size was mildly dilated.  4. Large pleural effusion in the left lateral region.  5. The mitral valve is normal in structure. Trivial mitral valve regurgitation.  6. The aortic valve is tricuspid. Aortic valve regurgitation is not visualized. No aortic stenosis is present.  7. The inferior vena cava is normal in size with greater than 50% respiratory variability, suggesting right atrial pressure of 3 mmHg. Comparison(s): Marked LVH with low QRS voltage on ECG suggests infiltrative cardiomyopathy, in particular cardiac amyloidosis. Conclusion(s)/Recommendation(s): Consider cardiac MRI or PYP nuclear scintigraphy for infiltrative cardiomyopathy. FINDINGS  Left Ventricle: Left ventricular ejection fraction, by estimation, is 35 to 40%. The left ventricle has moderately decreased function. The left ventricle demonstrates global hypokinesis. The average left ventricular global  longitudinal strain is -13.5 %. Strain was performed and the global longitudinal strain is abnormal. The left ventricular internal cavity size was normal in size. There is moderate concentric left ventricular hypertrophy. Left ventricular diastolic parameters  are consistent with Grade I diastolic dysfunction (impaired relaxation). Elevated left atrial pressure. Right Ventricle: The right ventricular size is normal. No increase in right ventricular wall thickness. Right ventricular systolic function is mildly reduced. Tricuspid regurgitation signal is inadequate for assessing PA pressure. Left Atrium: Left atrial size was mildly dilated. Right Atrium: Right atrial size was normal in size. Pericardium: There is no evidence of pericardial effusion. Mitral Valve: The mitral valve is normal in structure. Mild to moderate mitral annular calcification. Trivial mitral valve regurgitation. Tricuspid Valve: The tricuspid valve is normal in structure. Tricuspid valve regurgitation is not demonstrated. Aortic Valve: The aortic valve is tricuspid. Aortic valve regurgitation is not visualized. No aortic stenosis is present. Pulmonic Valve: The pulmonic valve was not well visualized. Pulmonic valve regurgitation is trivial. Aorta: The aortic root and ascending aorta are structurally normal, with no evidence of dilitation. Venous: The inferior vena cava is normal in size with greater than 50% respiratory variability, suggesting right atrial pressure of 3 mmHg. IAS/Shunts: No atrial level shunt detected by color flow Doppler. Additional Comments: There is a large pleural effusion in the left lateral region.  LEFT VENTRICLE PLAX 2D LVIDd:         4.40 cm      Diastology LVIDs:         3.50 cm      LV e' medial:    4.24 cm/s LV PW:         1.50 cm      LV E/e' medial:  12.2 LV IVS:        1.65 cm      LV e' lateral:   3.70 cm/s LVOT diam:     2.20 cm      LV E/e' lateral: 13.9 LV SV:         54 LV SV Index:   27           2D  Longitudinal Strain LVOT Area:     3.80 cm     2D Strain GLS Avg:     -13.5 %  LV Volumes (MOD) LV vol d, MOD A2C: 126.2 ml LV vol d, MOD A4C: 123.0 ml LV vol s, MOD A2C: 89.2 ml LV vol s, MOD A4C: 72.2 ml LV SV MOD A2C:     37.0 ml LV SV MOD A4C:     123.0 ml LV SV MOD BP:      43.7 ml RIGHT VENTRICLE             IVC RV S prime:     10.10 cm/s  IVC diam: 1.70 cm TAPSE (M-mode): 1.6 cm LEFT ATRIUM           Index        RIGHT ATRIUM          Index LA diam:      3.40 cm 1.72 cm/m   RA Area:     9.11 cm LA Vol (A2C): 31.0 ml 15.71 ml/m  RA Volume:   16.50 ml 8.36 ml/m LA Vol (A4C): 47.0 ml 23.81 ml/m  AORTIC VALVE LVOT Vmax:   86.40 cm/s LVOT Vmean:  55.300 cm/s LVOT VTI:    0.142 m  AORTA Ao Root diam: 3.50 cm Ao Asc diam:  3.60 cm MITRAL VALVE MV Area (PHT): 3.91 cm    SHUNTS MV Decel Time: 194 msec    Systemic VTI:  0.14 m MV E velocity: 51.60 cm/s  Systemic Diam: 2.20 cm MV A velocity: 66.50  cm/s MV E/A ratio:  0.78 Mihai Croitoru MD Electronically signed by Jerel Balding MD Signature Date/Time: 02/10/2024/1:31:24 PM    Final    DG Chest Portable 1 View Result Date: 02/09/2024 CLINICAL DATA:  Difficulty breathing as well as difficulty walking. Patient states fluid buildup. EXAM: PORTABLE CHEST 1 VIEW COMPARISON:  11/29/2023 FINDINGS: Lungs are somewhat hypoinflated with interval worsening of a moderate size right pleural effusion and small left pleural effusion. There is likely associated bibasilar atelectasis. Infection in the lung bases is possible. Remainder of the exam is unchanged. IMPRESSION: Interval worsening of moderate size right pleural effusion and small left pleural effusion with likely associated bibasilar atelectasis. Infection in the lung bases is possible. Electronically Signed   By: Toribio Agreste M.D.   On: 02/09/2024 16:54    Microbiology: Results for orders placed or performed during the hospital encounter of 02/09/24  C Difficile Quick Screen w PCR reflex     Status: None    Collection Time: 02/11/24 10:26 AM   Specimen: STOOL  Result Value Ref Range Status   C Diff antigen NEGATIVE NEGATIVE Final   C Diff toxin NEGATIVE NEGATIVE Final   C Diff interpretation No C. difficile detected.  Final    Comment: Performed at [facility], [address] [place], [place] [address]  Gastrointestinal Panel by PCR , Stool     Status: None   Collection Time: 02/11/24 10:26 AM   Specimen: STOOL  Result Value Ref Range Status   Campylobacter species NOT DETECTED NOT DETECTED Final   Plesimonas shigelloides NOT DETECTED NOT DETECTED Final   Salmonella species NOT DETECTED NOT DETECTED Final   Yersinia enterocolitica NOT DETECTED NOT DETECTED Final   Vibrio species NOT DETECTED NOT DETECTED Final   Vibrio cholerae NOT DETECTED NOT DETECTED Final   Enteroaggregative E coli (EAEC) NOT DETECTED NOT DETECTED Final   Enteropathogenic E coli (EPEC) NOT DETECTED NOT DETECTED Final   Enterotoxigenic E coli (ETEC) NOT DETECTED NOT DETECTED Final   Shiga like toxin producing E coli (STEC) NOT DETECTED NOT DETECTED Final   Shigella/Enteroinvasive E coli (EIEC) NOT DETECTED NOT DETECTED Final   Cryptosporidium NOT DETECTED NOT DETECTED Final   Cyclospora cayetanensis NOT DETECTED NOT DETECTED Final   Entamoeba histolytica NOT DETECTED NOT DETECTED Final   Giardia lamblia NOT DETECTED NOT DETECTED Final   Adenovirus F40/41 NOT DETECTED NOT DETECTED Final   Astrovirus NOT DETECTED NOT DETECTED Final   Norovirus GI/GII NOT DETECTED NOT DETECTED Final   Rotavirus A NOT DETECTED NOT DETECTED Final   Sapovirus (I, II, IV, and V) NOT DETECTED NOT DETECTED Final    Comment: Performed at [facility], [address] [place], [place] [address]    Labs: CBC: Recent Labs  Lab 03/03/24 1545  WBC 10.9*  NEUTROABS 7.6  HGB 12.3  HCT 37.5  MCV 94.9  PLT 375   Basic Metabolic Panel: Recent Labs  Lab 03/03/24 1545 03/04/24 0323 03/05/24 0440 03/06/24 0447 03/07/24 0428   NA 138 136 138 134* 133*  K 4.6 4.7 4.4 5.1 4.6  CL 103 104 104 100 96*  CO2 25 25 27 28 30   GLUCOSE 115* 139* 44* 281* 150*  BUN 16 17 16 14 14   CREATININE 0.72 0.58 0.52 0.57 0.57  CALCIUM 8.6* 8.4* 8.3* 8.3* 8.8*  MG 2.4  --  2.4  --  2.6*   Liver Function Tests: Recent Labs  Lab 03/03/24 1545  AST 38  ALT 44  ALKPHOS 116  BILITOT <0.2  PROT 6.6  ALBUMIN 3.2*   CBG: Recent Labs  Lab 03/06/24 1250 03/06/24 1625 03/06/24 2002 03/07/24 0128 03/07/24 0744  GLUCAP 136* 397* 239* 196* 184*    Discharge time spent: greater than 45 minutes.  Signed: Adriana DELENA Grams, MD [facility] 03/07/2024

## 2024-03-08 ENCOUNTER — Other Ambulatory Visit: Payer: Self-pay

## 2024-03-11 ENCOUNTER — Telehealth (HOSPITAL_COMMUNITY): Payer: Self-pay

## 2024-03-11 NOTE — Telephone Encounter (Signed)
 Called to confirm/remind patient of their appointment at the [facility] on 03/12/24.   Appointment:   [] Confirmed  [x] Left mess   [] No answer/No voice mail  [] VM Full/unable to leave message  [] Phone not in service  Patient reminded to bring all medications and/or complete list.

## 2024-03-11 NOTE — Progress Notes (Signed)
 ADV[facility]ED [facility] CLINIC NOTE  Primary Care: [facility], [place], [place] Primary Cardiologist: Dr. Alvan HF Cardiologist: Dr. Zenaida  HPI: Christie Anderson is a 48 y.o. female with HFrEF and tobacco abuse.   Admitted 9/25 with acute HFrEF with marked volume overload, weight up ~ 50lbs over 6 months. Echo showed LVEF 35-40% RV mildly reduced. Marked LVH with low volts on EKG. Diuresed with Lasix  gtt and Diamox , overall down 60 lbs. Underwent cMRI showing LVEF 32%,  RVEF 39%, nonspecific pattern of LGE with midwall septal scar, seen in NiCM. No definite findings of cardiac amyloidosis. Underwent RHC + endomyocardial biopsy showing severely reduced filling pressures, and severely reduced cardiac index. Biopsy results showed benign myocardium showing mild nonspecific cardial myocyte hyperplasia. Negative for granulomas, inflammation or any other infiltrative process. GDMT titrated and discharged home, weight 120 lbs.  Re-admitted 10/25 with a/c HF. She was diuresed and discharged home, weight 130 lbs.  Today she returns for post hospital HF follow up. Overall feeling fine. Feels occasional heart flutters with exertion. She feels weak and not physically active. Feels swelling in abdomen and legs. Denies abnormal bleeding, CP, dizziness, or PND/Orthopnea. Appetite waxes and wanes. Weight at home 135 pounds. Decreased torsemide to 20 bid 2/2 to nausea. Stopped smoking, now vaping.  Cardiac Studies - Ltd echo (02/16/24): EF 35-40% - RHC (9/25): RA 3, PA 17/10 (13), PCWP 6, CO/CI (Fick) 2.28/1.2, PVR 1.8-3 WU, PAPi 2.5 - cMRI (9/25): LVEF 32%, RVEF 39%, nonspecific ECV elevation (32%) - Echo (02/10/24): EF 35-40%, moderate LVH, G1DD, RV mildly reduced,   Past Medical History:  Diagnosis Date   CHF (congestive heart failure) (HCC)    Diabetes (HCC)    Current Outpatient Medications  Medication Sig Dispense Refill   Accu-Chek Softclix Lancets lancets Use 3 (three) times daily as directed to check blood sugar.  100 each 0   Blood Glucose Monitoring Suppl (BLOOD GLUCOSE MONITOR SYSTEM) w/Device KIT Use 3 (three) times daily. 1 kit 0   Glucose Blood (BLOOD GLUCOSE TEST STRIPS) STRP Use 3 (three) times daily as directed to check blood sugar. 100 strip 0   Insulin  Glargine (BASAGLAR  KWIKPEN) 100 UNIT/ML Inject 30 Units into the skin every evening. May substitute as needed per insurance. 27 mL 3   insulin  lispro (HUMALOG ) 100 UNIT/ML KwikPen Inject 8 Units into the skin 3 (three) times daily. Pt is taking 4-16 units based on blood sugar (sliding scale) pt does not have scale in front of her to read scale 21.6 mL 0   Insulin  Pen Needle 32G X 4 MM MISC Use 3 (three) times daily. 100 each 0   Insulin  Syringe-Needle U-100 (INSULIN  SYRINGE .5CC/30GX5/16) 30G X 5/16 0.5 ML MISC Use one syringe and needle per insulin  injection. 100 each 1   Lancet Device MISC 1 each by Does not apply route 3 (three) times daily. May dispense any manufacturer covered by patient's insurance. 1 each 0   losartan  (COZAAR ) 50 MG tablet Take 1 tablet (50 mg total) by mouth daily. 30 tablet 2   potassium chloride  SA (KLOR-CON  M) 20 MEQ tablet Take 0.5 tablets (10 mEq total) by mouth daily. 30 tablet 0   spironolactone  (ALDACTONE ) 25 MG tablet Take 1 tablet (25 mg total) by mouth daily. 30 tablet 2   torsemide (DEMADEX) 20 MG tablet Take 2 tablets (40 mg total) by mouth 2 (two) times daily. 180 tablet 0   No current facility-administered medications for this encounter.   Allergies  Allergen Reactions  Bovine (Beef) Protein-Containing Drug Products Hives and Swelling   Porcine (Pork) Protein-Containing Drug Products Hives and Swelling   Amoxicillin Other (See Comments)    Unknown    Doxycycline Nausea And Vomiting   Penicillins Other (See Comments)    Unknown    Social History   Socioeconomic History   Marital status: Single    Spouse name: Not on file   Number of children: 1   Years of education: Not on file   Highest  education level: GED or equivalent  Occupational History   Occupation: Unemployed  Tobacco Use   Smoking status: Every Day    Types: Cigarettes   Smokeless tobacco: Never  Substance and Sexual Activity   Alcohol use: Not on file   Drug use: Not on file   Sexual activity: Not on file  Other Topics Concern   Not on file  Social History Narrative   Not on file   Social Drivers of Health   Financial Resource Strain: Medium Risk (02/26/2024)   Overall Financial Resource Strain (CARDIA)    Difficulty of Paying Living Expenses: Somewhat hard  Food Insecurity: Food Insecurity Present (03/03/2024)   Hunger Vital Sign    Worried About Running Out of Food in the Last Year: Sometimes true    Ran Out of Food in the Last Year: Sometimes true  Transportation Needs: No Transportation Needs (03/03/2024)   PRAPARE - Administrator, Civil Service (Medical): No    Lack of Transportation (Non-Medical): No  Physical Activity: Not on file  Stress: Not on file  Social Connections: Not on file  Intimate Partner Violence: Not At Risk (03/03/2024)   Humiliation, Afraid, Rape, and Kick questionnaire    Fear of Current or Ex-Partner: No    Emotionally Abused: No    Physically Abused: No    Sexually Abused: No   Family History  Problem Relation Age of Onset   Hypertension Mother    Stroke Mother    Stroke Paternal Grandmother    Wt Readings from Last 3 Encounters:  03/12/24 63 kg (138 lb 12.8 oz)  03/07/24 59.4 kg (130 lb 15.3 oz)  02/27/24 61.2 kg (135 lb)   BP 126/62   Pulse 89   Wt 63 kg (138 lb 12.8 oz)   LMP 12/01/2011   SpO2 99%   BMI 20.50 kg/m   PHYSICAL EXAM: General:  NAD. No resp difficulty, arrived in [facility], chronically-ill appearing. HEENT: Normal Neck: Supple. JVP 10-12 Cor: Regular rate & rhythm. No rubs, gallops or murmurs. Lungs: Clear Abdomen: tender, nondistended.  Extremities: No cyanosis, clubbing, rash, 2-3+ BLE edema to knees; legs warm Neuro: Alert &  oriented x 3, moves all 4 extremities w/o difficulty. Affect pleasant.  ReDs reading: 40%, abnormal  ECG (personally reviewed): NSR   ASSESSMENT & PLAN: Chronic HFrEF: - Echo 9/25: EF 35-40% RV mildly reduced. Marked LVH with low volts on EKG.  - ? Infiltrative disease. No previous cardiac diagnosis. Mom died from HF.  - EKG low voltage with poor anterior R wave progression consistent with underlying amyloid.  - RHC 9/25: severely reduced filling pressures and severely reduced cardiac index - MMP no M spike observed. K/L ratio elevated at 1.85. Biopsy not consistent with cardiac amyloid. - CMRI 9/25:  LVEF 32%, RVEF 39%, nonspecific pattern of delayed enhancement with midwall septal scar, seen in NiCM. No definite findings of cardiac amyloidosis.  - NYHA II-III, functional class confounded by deconditioning/weakness. Volume up. Weight up 8 lbs,  ReDs 40%. Warm and wet on exam - Use Furoscix  daily + 40 KCL daily x 3 days, hold torsemide while using Furoscix  - After 3 days, resume torsemide 20 mg bid + 20 KCL daily - Continue spiro 25 mg daily  - Continue losartan  50 mg daily. (Uninsured so no Entresto ) - Off SGLTI2 with A1c >10. - Eventual beta blocker when diuresed - Will need to consider advanced therapies in the near future.  - Continue compression hose. - CMET and BNP today, repeat BMET at close follow up.  - Discuss Cardiac Rehab when insured - Will need repeat echo ~ 3 months.    Uncontrolled DM  - Hgb A1C 14.  - No SGLT2i with A1c 14. - Management per PCP.   Tobacco Abuse - Stopped cigarettes, now vapes - Cessation advised.   SDOH - uninsured, Medicaid-pending - Engage HFSW to help with resources - Cardiac meds thru HF fund until insurance approved  Follow up in 10 days with APP (will need EKG, BMET and Mag). She is high risk for re-admission.  Harlene Gainer, FNP-BC 03/12/24

## 2024-03-12 ENCOUNTER — Ambulatory Visit (HOSPITAL_COMMUNITY): Payer: Self-pay | Admitting: Cardiology

## 2024-03-12 ENCOUNTER — Ambulatory Visit (HOSPITAL_COMMUNITY): Payer: Self-pay | Admitting: Family Medicine

## 2024-03-12 ENCOUNTER — Ambulatory Visit (HOSPITAL_COMMUNITY)
Admission: RE | Admit: 2024-03-12 | Discharge: 2024-03-12 | Disposition: A | Discharge status: Home / Self Care | Payer: MEDICAID | Source: Ambulatory Visit | Attending: Family Medicine | Admitting: Family Medicine

## 2024-03-12 ENCOUNTER — Encounter (HOSPITAL_COMMUNITY): Payer: Self-pay

## 2024-03-12 ENCOUNTER — Other Ambulatory Visit: Payer: Self-pay

## 2024-03-12 DIAGNOSIS — Z59868 Other specified financial insecurity: Secondary | ICD-10-CM | POA: Insufficient documentation

## 2024-03-12 DIAGNOSIS — Z5971 Insufficient health insurance coverage: Secondary | ICD-10-CM | POA: Insufficient documentation

## 2024-03-12 DIAGNOSIS — Z560 Unemployment, unspecified: Secondary | ICD-10-CM | POA: Insufficient documentation

## 2024-03-12 DIAGNOSIS — I517 Cardiomegaly: Secondary | ICD-10-CM | POA: Insufficient documentation

## 2024-03-12 DIAGNOSIS — R531 Weakness: Secondary | ICD-10-CM | POA: Insufficient documentation

## 2024-03-12 DIAGNOSIS — R9431 Abnormal electrocardiogram [ECG] [EKG]: Secondary | ICD-10-CM | POA: Insufficient documentation

## 2024-03-12 DIAGNOSIS — Z720 Tobacco use: Secondary | ICD-10-CM | POA: Insufficient documentation

## 2024-03-12 DIAGNOSIS — R1900 Intra-abdominal and pelvic swelling, mass and lump, unspecified site: Secondary | ICD-10-CM | POA: Insufficient documentation

## 2024-03-12 DIAGNOSIS — Z79899 Other long term (current) drug therapy: Secondary | ICD-10-CM | POA: Insufficient documentation

## 2024-03-12 DIAGNOSIS — E119 Type 2 diabetes mellitus without complications: Secondary | ICD-10-CM

## 2024-03-12 DIAGNOSIS — I5022 Chronic systolic (congestive) heart failure: Secondary | ICD-10-CM | POA: Insufficient documentation

## 2024-03-12 DIAGNOSIS — Z139 Encounter for screening, unspecified: Secondary | ICD-10-CM

## 2024-03-12 DIAGNOSIS — I428 Other cardiomyopathies: Secondary | ICD-10-CM | POA: Insufficient documentation

## 2024-03-12 DIAGNOSIS — Z794 Long term (current) use of insulin: Secondary | ICD-10-CM | POA: Insufficient documentation

## 2024-03-12 DIAGNOSIS — I5020 Unspecified systolic (congestive) heart failure: Secondary | ICD-10-CM

## 2024-03-12 DIAGNOSIS — E1165 Type 2 diabetes mellitus with hyperglycemia: Secondary | ICD-10-CM | POA: Insufficient documentation

## 2024-03-12 LAB — BRAIN NATRIURETIC PEPTIDE: B Natriuretic Peptide: 477.9 pg/mL — ABNORMAL HIGH (ref 0.0–100.0)

## 2024-03-12 LAB — COMPREHENSIVE METABOLIC PANEL WITH GFR
ALT: 28 U/L (ref 0–44)
AST: 24 U/L (ref 15–41)
Albumin: 2.7 g/dL — ABNORMAL LOW (ref 3.5–5.0)
Alkaline Phosphatase: 75 U/L (ref 38–126)
Anion gap: 6 (ref 5–15)
BUN: 22 mg/dL — ABNORMAL HIGH (ref 6–20)
CO2: 27 mmol/L (ref 22–32)
Calcium: 8.5 mg/dL — ABNORMAL LOW (ref 8.9–10.3)
Chloride: 100 mmol/L (ref 98–111)
Creatinine, Ser: 0.73 mg/dL (ref 0.44–1.00)
GFR, Estimated: >60 mL/min (ref >60)
Glucose, Bld: 275 mg/dL — ABNORMAL HIGH (ref 70–99)
Potassium: 4.9 mmol/L (ref 3.5–5.1)
Sodium: 133 mmol/L — ABNORMAL LOW (ref 135–145)
Total Bilirubin: 0.5 mg/dL (ref 0.0–1.2)
Total Protein: 6.7 g/dL (ref 6.5–8.1)

## 2024-03-12 MED ORDER — FUROSCIX 80 MG/10ML ~~LOC~~ CTKT: 80.0000 mg | CARTRIDGE | Freq: Every day | SUBCUTANEOUS | Status: DC | PRN

## 2024-03-12 MED ORDER — POTASSIUM CHLORIDE CRYS ER 20 MEQ PO TBCR: 10.0000 meq | EXTENDED_RELEASE_TABLET | Freq: Two times a day (BID) | ORAL | Status: DC

## 2024-03-12 MED ORDER — TORSEMIDE 20 MG PO TABS
20.0000 mg | ORAL_TABLET | Freq: Two times a day (BID) | ORAL | 3 refills | Status: DC
  Filled 2024-03-12 – 2024-03-19 (×2): qty 60, 30d supply, fill #0

## 2024-03-12 NOTE — Progress Notes (Signed)
 Medication Samples have been provided to the patient.  Drug name: Furoscix        Strength: 80mg         Qty: 3 kits  LOT: [id number]  Exp.Date: 04/21/2025  Dosing instructions: as directed by the HF clinic  The patient has been instructed regarding the correct time, dose, and frequency of taking this medication, including desired effects and most common side effects.   Kenitha Glendinning B Makaylyn Sinyard 3:41 PM 03/12/2024

## 2024-03-12 NOTE — Patient Instructions (Addendum)
 Medication Changes:  Your provider has order Furoscix  for you. This is an on-body infuser that gives you a dose of Furosemide .   It will be shipped to your home from [facility], they will call you before shipping  Ensure you write down the time you start your infusion so that if there is a problem you will know how long the infusion lasted  Use Furoscix  only AS DIRECTED by our office  DO NOT TORSEMIDE WHILE TAKING FUROSCIX    Dosing Directions:   Day 1= TAKE 1 DOSE OF FUROSCIX -- WITH (2) TABLETS OF POTASSIUM   Day 2= TAKE 1 DOSE OF FUROSCIX -- WITH (2) TABLETS OF POTASSIUM   Day 3= TAKE 1 DOSE OF FUROSCIX -- WITH (2) TABLETS OF POTASSIUM   ON SATURDAY RESTART TORSEMIDE 20MG  TWICE DAILY WITH (1/2) TABLET 10MEQ OF POTASSIUM TWICE DAILY   Lab Work:  Labs done today, your results will be available in MyChart, we will contact you for abnormal readings.  Follow-Up in: 2 WEEKS AS SCHEDULED WITH APP CLINIC   At the [facility], you and your health needs are our priority. We have a designated team specialized in the treatment of Heart Failure. This Care Team includes your primary Heart Failure Specialized Cardiologist (physician), Advanced Practice Providers (APPs- Physician Assistants and Nurse Practitioners), and Pharmacist who all work together to provide you with the care you need, when you need it.   You may see any of the following providers on your designated Care Team at your next follow up:  Dr. Toribio Fuel Dr. Ezra Shuck Dr. Ria Commander Dr. Odis Brownie Greig Mosses, NP Caffie Shed, [place] [facility] [place], [place] Beckey Coe, NP [place] Lee, NP Tinnie Redman, PharmD   Please be sure to bring in all your medications bottles to every appointment.   Need to Contact Us :  If you have any questions or concerns before your next appointment please send us  a message through [place] or call our office at  [phone].    TO LEAVE A MESSAGE FOR THE NURSE SELECT OPTION 2, PLEASE LEAVE A MESSAGE INCLUDING: YOUR NAME DATE OF BIRTH CALL BACK NUMBER REASON FOR CALL**this is important as we prioritize the call backs  YOU WILL RECEIVE A CALL BACK THE SAME DAY AS LONG AS YOU CALL BEFORE 4:00 PM

## 2024-03-12 NOTE — Addendum Note (Signed)
 Encounter addended by: Dante Jeannine HERO, CMA on: 03/12/2024 4:11 PM  Actions taken: Visit diagnoses modified, Order list changed, Diagnosis association updated

## 2024-03-15 ENCOUNTER — Telehealth (HOSPITAL_COMMUNITY): Payer: Self-pay | Admitting: Cardiology

## 2024-03-15 NOTE — Telephone Encounter (Signed)
 Patient called to inform provider she will not use third dose of furoscix  today. Reports she was able to use 2 out of 3 ordered however increase in dizziness and sick to her stomach.  Pt reports she went back to  torsemide this AM   Wanted to notify provider in the event additional changes are needed

## 2024-03-15 NOTE — Telephone Encounter (Signed)
PT AWARE AND VOICED UNDERSTANDING  

## 2024-03-19 ENCOUNTER — Other Ambulatory Visit: Payer: Self-pay

## 2024-03-19 ENCOUNTER — Encounter (HOSPITAL_COMMUNITY): Payer: Self-pay

## 2024-03-19 ENCOUNTER — Emergency Department (HOSPITAL_COMMUNITY): Payer: MEDICAID

## 2024-03-19 ENCOUNTER — Inpatient Hospital Stay (HOSPITAL_COMMUNITY)
Admission: EM | Admit: 2024-03-19 | Discharge: 2024-03-21 | DRG: 291 | Disposition: A | Discharge status: Home / Self Care | Payer: MEDICAID | Attending: Hospitalist | Admitting: Hospitalist

## 2024-03-19 ENCOUNTER — Ambulatory Visit: Payer: Self-pay | Admitting: Physician Assistant

## 2024-03-19 DIAGNOSIS — E1165 Type 2 diabetes mellitus with hyperglycemia: Secondary | ICD-10-CM | POA: Diagnosis present

## 2024-03-19 DIAGNOSIS — I10 Essential (primary) hypertension: Secondary | ICD-10-CM | POA: Diagnosis present

## 2024-03-19 DIAGNOSIS — Z794 Long term (current) use of insulin: Secondary | ICD-10-CM

## 2024-03-19 DIAGNOSIS — I5043 Acute on chronic combined systolic (congestive) and diastolic (congestive) heart failure: Secondary | ICD-10-CM | POA: Diagnosis present

## 2024-03-19 DIAGNOSIS — I110 Hypertensive heart disease with heart failure: Principal | ICD-10-CM | POA: Diagnosis present

## 2024-03-19 DIAGNOSIS — Z880 Allergy status to penicillin: Secondary | ICD-10-CM

## 2024-03-19 DIAGNOSIS — Z59868 Other specified financial insecurity: Secondary | ICD-10-CM

## 2024-03-19 DIAGNOSIS — I428 Other cardiomyopathies: Secondary | ICD-10-CM | POA: Diagnosis present

## 2024-03-19 DIAGNOSIS — I509 Heart failure, unspecified: Principal | ICD-10-CM

## 2024-03-19 DIAGNOSIS — Z823 Family history of stroke: Secondary | ICD-10-CM

## 2024-03-19 DIAGNOSIS — Z888 Allergy status to other drugs, medicaments and biological substances status: Secondary | ICD-10-CM

## 2024-03-19 DIAGNOSIS — Z79899 Other long term (current) drug therapy: Secondary | ICD-10-CM

## 2024-03-19 DIAGNOSIS — F17290 Nicotine dependence, other tobacco product, uncomplicated: Secondary | ICD-10-CM | POA: Diagnosis present

## 2024-03-19 DIAGNOSIS — Z8249 Family history of ischemic heart disease and other diseases of the circulatory system: Secondary | ICD-10-CM

## 2024-03-19 DIAGNOSIS — Z5948 Other specified lack of adequate food: Secondary | ICD-10-CM

## 2024-03-19 DIAGNOSIS — Z5941 Food insecurity: Secondary | ICD-10-CM

## 2024-03-19 DIAGNOSIS — D649 Anemia, unspecified: Secondary | ICD-10-CM | POA: Diagnosis present

## 2024-03-19 DIAGNOSIS — Z5971 Insufficient health insurance coverage: Secondary | ICD-10-CM

## 2024-03-19 DIAGNOSIS — I5023 Acute on chronic systolic (congestive) heart failure: Principal | ICD-10-CM | POA: Diagnosis present

## 2024-03-19 DIAGNOSIS — Z560 Unemployment, unspecified: Secondary | ICD-10-CM

## 2024-03-19 HISTORY — DX: Essential (primary) hypertension: I10

## 2024-03-19 LAB — CBC WITH DIFFERENTIAL/PLATELET
Abs Immature Granulocytes: 0.08 K/uL — ABNORMAL HIGH (ref 0.00–0.07)
Basophils Absolute: 0 K/uL (ref 0.0–0.1)
Basophils Relative: 1 %
Eosinophils Absolute: 0.2 K/uL (ref 0.0–0.5)
Eosinophils Relative: 2 %
HCT: 38.7 % (ref 36.0–46.0)
Hemoglobin: 12.3 g/dL (ref 12.0–15.0)
Immature Granulocytes: 1 %
Lymphocytes Relative: 18 %
Lymphs Abs: 1.6 K/uL (ref 0.7–4.0)
MCH: 30.8 pg (ref 26.0–34.0)
MCHC: 31.8 g/dL (ref 30.0–36.0)
MCV: 96.8 fL (ref 80.0–100.0)
Monocytes Absolute: 0.7 K/uL (ref 0.1–1.0)
Monocytes Relative: 8 %
Neutro Abs: 6.2 K/uL (ref 1.7–7.7)
Neutrophils Relative %: 70 %
Platelets: 283 K/uL (ref 150–400)
RBC: 4 MIL/uL (ref 3.87–5.11)
RDW: 12.4 % (ref 11.5–15.5)
WBC: 8.8 K/uL (ref 4.0–10.5)
nRBC: 0 % (ref 0.0–0.2)

## 2024-03-19 LAB — COMPREHENSIVE METABOLIC PANEL WITH GFR
ALT: 38 U/L (ref 0–44)
AST: 26 U/L (ref 15–41)
Albumin: 3.6 g/dL (ref 3.5–5.0)
Alkaline Phosphatase: 88 U/L (ref 38–126)
Anion gap: 7 (ref 5–15)
BUN: 17 mg/dL (ref 6–20)
CO2: 31 mmol/L (ref 22–32)
Calcium: 8.8 mg/dL — ABNORMAL LOW (ref 8.9–10.3)
Chloride: 100 mmol/L (ref 98–111)
Creatinine, Ser: 0.73 mg/dL (ref 0.44–1.00)
GFR, Estimated: >60 mL/min (ref >60)
Glucose, Bld: 189 mg/dL — ABNORMAL HIGH (ref 70–99)
Potassium: 4.9 mmol/L (ref 3.5–5.1)
Sodium: 138 mmol/L (ref 135–145)
Total Bilirubin: <0.2 mg/dL (ref 0.0–1.2)
Total Protein: 7.1 g/dL (ref 6.5–8.1)

## 2024-03-19 LAB — TROPONIN T, HIGH SENSITIVITY
Troponin T High Sensitivity: 26 ng/L — ABNORMAL HIGH (ref 0–19)
Troponin T High Sensitivity: 30 ng/L — ABNORMAL HIGH (ref 0–19)

## 2024-03-19 LAB — GLUCOSE, CAPILLARY: Glucose-Capillary: 260 mg/dL — ABNORMAL HIGH (ref 70–99)

## 2024-03-19 LAB — PRO BRAIN NATRIURETIC PEPTIDE: Pro Brain Natriuretic Peptide: 3027 pg/mL — ABNORMAL HIGH (ref <300.0)

## 2024-03-19 MED ORDER — LOSARTAN POTASSIUM 50 MG PO TABS
50.0000 mg | ORAL_TABLET | Freq: Every day | ORAL | Status: DC
  Administered 2024-03-20 – 2024-03-21 (×2): 50 mg via ORAL
  Filled 2024-03-19 (×2): qty 1

## 2024-03-19 MED ORDER — ONDANSETRON HCL 4 MG/2ML IJ SOLN: 4.0000 mg | Freq: Four times a day (QID) | INTRAMUSCULAR | Status: DC | PRN

## 2024-03-19 MED ORDER — ENOXAPARIN SODIUM 40 MG/0.4ML IJ SOSY
40.0000 mg | PREFILLED_SYRINGE | INTRAMUSCULAR | Status: DC
  Administered 2024-03-19 – 2024-03-20 (×2): 40 mg via SUBCUTANEOUS
  Filled 2024-03-19 (×2): qty 0.4

## 2024-03-19 MED ORDER — INSULIN GLARGINE-YFGN 100 UNIT/ML ~~LOC~~ SOLN
20.0000 [IU] | Freq: Every evening | SUBCUTANEOUS | Status: DC
  Administered 2024-03-19 – 2024-03-20 (×2): 20 [IU] via SUBCUTANEOUS
  Filled 2024-03-19 (×3): qty 0.2

## 2024-03-19 MED ORDER — FUROSEMIDE 10 MG/ML IJ SOLN
80.0000 mg | Freq: Three times a day (TID) | INTRAMUSCULAR | Status: DC
  Administered 2024-03-19 – 2024-03-20 (×4): 80 mg via INTRAVENOUS
  Filled 2024-03-19 (×5): qty 8

## 2024-03-19 MED ORDER — INSULIN ASPART 100 UNIT/ML IJ SOLN
0.0000 [IU] | Freq: Three times a day (TID) | INTRAMUSCULAR | Status: DC
  Administered 2024-03-20: 8 [IU] via SUBCUTANEOUS
  Administered 2024-03-20: 2 [IU] via SUBCUTANEOUS
  Administered 2024-03-20: 8 [IU] via SUBCUTANEOUS
  Administered 2024-03-21: 3 [IU] via SUBCUTANEOUS
  Administered 2024-03-21: 8 [IU] via SUBCUTANEOUS

## 2024-03-19 MED ORDER — ONDANSETRON HCL 4 MG PO TABS: 4.0000 mg | ORAL_TABLET | Freq: Four times a day (QID) | ORAL | Status: DC | PRN

## 2024-03-19 MED ORDER — POLYETHYLENE GLYCOL 3350 17 G PO PACK: 17.0000 g | PACK | Freq: Every day | ORAL | Status: DC | PRN

## 2024-03-19 MED ORDER — ACETAMINOPHEN 325 MG PO TABS: 650.0000 mg | ORAL_TABLET | Freq: Four times a day (QID) | ORAL | Status: DC | PRN

## 2024-03-19 MED ORDER — FUROSEMIDE 10 MG/ML IJ SOLN
80.0000 mg | Freq: Once | INTRAMUSCULAR | Status: AC
  Administered 2024-03-19: 80 mg via INTRAVENOUS
  Filled 2024-03-19: qty 8

## 2024-03-19 MED ORDER — BASAGLAR KWIKPEN 100 UNIT/ML ~~LOC~~ SOPN: 20.0000 [IU] | PEN_INJECTOR | Freq: Every evening | SUBCUTANEOUS | Status: DC

## 2024-03-19 MED ORDER — ACETAMINOPHEN 650 MG RE SUPP: 650.0000 mg | Freq: Four times a day (QID) | RECTAL | Status: DC | PRN

## 2024-03-19 MED ORDER — INSULIN ASPART 100 UNIT/ML IJ SOLN
0.0000 [IU] | Freq: Every day | INTRAMUSCULAR | Status: DC
  Administered 2024-03-19: 3 [IU] via SUBCUTANEOUS

## 2024-03-19 MED ORDER — SPIRONOLACTONE 25 MG PO TABS
25.0000 mg | ORAL_TABLET | Freq: Every day | ORAL | Status: DC
  Administered 2024-03-20 – 2024-03-21 (×2): 25 mg via ORAL
  Filled 2024-03-19 (×2): qty 1

## 2024-03-19 NOTE — ED Triage Notes (Signed)
 Pt denies feeling SOB, but reports her chest does feel heavy.

## 2024-03-19 NOTE — H&P (Signed)
 History and Physical    Christie Anderson FMW:[id number] DOB: 12/22/1975 DOA: 03/19/2024  PCP: Mancil Pfeiffer, PA-C   Patient coming from: Home  I have personally briefly reviewed patient's old medical records in [facility]  Chief Complaint: Weight gain, leg swelling  HPI: Christie Anderson is a 48 y.o. female with medical history significant for diastolic and systolic CHF, hypertension and diabetes mellitus. Patient presented to the ED with complaints of progressive weight gain, with bilateral lower extremity swelling, bloating - thighs, abdomen, since recent hospitalization.  She reports some chest heaviness when she lies down otherwise she is not short of breath.  No chest pain.  She reports weight has gone from 134 when she was discharged from the hospital to 152 today.  Recent hospitalization 10/12 to 10/16 for decompensated CHF, she was diuresed.  Per charts she was 130.9 pounds on discharge.  She was discharged on torsemide 40 mg twice daily. She is weighing 152 pounds today.  She reports she onset of dizziness while on 40 mg torsemide BID and so dose was decreased to 20 mg BID by her cardiologist.  She was started on Furoscix  80 mg subcut injection by heart failure clinic, she was only able to take 2 doses.  ED Course: Temperature 98.  Heart rate 89.  Respirate rate 17.  Blood pressure 152/91, O2 sat 100% on room air. proBNP elevated at 3027. Chest x-ray without acute abnormality, shows mild chronic interstitial lung disease bronchitic changes compatible with smoking. EDP talked to Dr. Alvan, recommended starting Lasix  80 mg 3 times daily, cardiology team will see in consult and determine if patient should be managed by heart failure team.  Review of Systems: As per HPI all other systems reviewed and negative.  Past Medical History:  Diagnosis Date   CHF (congestive heart failure) (HCC)    Diabetes (HCC)    Hypertension     Past Surgical History:  Procedure Laterality Date    ENDOMYOCARDIAL BIOPSY N/A 02/16/2024   Procedure: ENDOMYOCARDIAL BIOPSY;  Surgeon: Gardenia Led, DO;  Location: [facility];  Service: Cardiovascular;  Laterality: N/A;   RIGHT HEART CATH N/A 02/16/2024   Procedure: RIGHT HEART CATH;  Surgeon: Gardenia Led, DO;  Location: [facility];  Service: Cardiovascular;  Laterality: N/A;     reports that she has been smoking cigarettes. She has been exposed to tobacco smoke. She has never used smokeless tobacco. She reports that she does not currently use alcohol. She reports that she does not use drugs.  Allergies  Allergen Reactions   Bovine (Beef) Protein-Containing Drug Products Hives and Swelling   Porcine (Pork) Protein-Containing Drug Products Hives and Swelling   Amoxicillin Other (See Comments)    Unknown    Doxycycline Nausea And Vomiting   Penicillins Other (See Comments)    Unknown     Family History  Problem Relation Age of Onset   Hypertension Mother    Stroke Mother    Stroke Paternal Grandmother     Prior to Admission medications   Medication Sig Start Date End Date Taking? Authorizing Provider  Accu-Chek Softclix Lancets lancets Use 3 (three) times daily as directed to check blood sugar. 02/16/24   Fairy Frames, MD  Blood Glucose Monitoring Suppl (BLOOD GLUCOSE MONITOR SYSTEM) w/Device KIT Use 3 (three) times daily. 02/16/24   Fairy Frames, MD  Furosemide  (FUROSCIX ) 80 MG/10ML CTKT Inject 80 mg into the skin daily as needed (AS DIRECTED BY THE HEART FAILURE CLINIC). 03/12/24  [place]ford, Harlene M, FNP  Glucose Blood (BLOOD GLUCOSE TEST STRIPS) STRP Use 3 (three) times daily as directed to check blood sugar. 02/16/24   Fairy Frames, MD  Insulin  Glargine (BASAGLAR  [facility]) 100 UNIT/ML Inject 30 Units into the skin every evening. May substitute as needed per insurance. 03/07/24   Shahmehdi, Adriana LABOR, MD  insulin  lispro (HUMALOG ) 100 UNIT/ML KwikPen Inject 8 Units into the skin 3 (three) times daily.  Pt is taking 4-16 units based on blood sugar (sliding scale) pt does not have scale in front of her to read scale 03/07/24 06/05/24  Shahmehdi, Adriana LABOR, MD  Insulin  Pen Needle 32G X 4 MM MISC Use 3 (three) times daily. 02/16/24   Fairy Frames, MD  Insulin  Syringe-Needle U-100 (INSULIN  SYRINGE .5CC/30GX5/16) 30G X 5/16 0.5 ML MISC Use one syringe and needle per insulin  injection. 12/13/23   Grooms, [place], PA-C  Lancet Device MISC 1 each by Does not apply route 3 (three) times daily. May dispense any manufacturer covered by patient's insurance. 02/16/24   Fairy Frames, MD  losartan  (COZAAR ) 50 MG tablet Take 1 tablet (50 mg total) by mouth daily. 03/07/24   Shahmehdi, Adriana LABOR, MD  potassium chloride  SA (KLOR-CON  M) 20 MEQ tablet Take 0.5 tablets (10 mEq total) by mouth 2 (two) times daily. 03/12/24   Milford, Harlene HERO, FNP  spironolactone  (ALDACTONE ) 25 MG tablet Take 1 tablet (25 mg total) by mouth daily. 03/07/24   Shahmehdi, Seyed A, MD  torsemide (DEMADEX) 20 MG tablet Take 1 tablet (20 mg total) by mouth 2 (two) times daily. 03/12/24   Glena Harlene HERO, FNP    Physical Exam: Vitals:   03/19/24 1430 03/19/24 1430 03/19/24 1432  BP:  (!) 152/91   Pulse:  89   Resp:  17   Temp:  98 F (36.7 C)   TempSrc:  Oral   SpO2:  100%   Weight: 63 kg  68.9 kg  Height: 5' 9 (1.753 m)      Constitutional: NAD, calm, comfortable Vitals:   03/19/24 1430 03/19/24 1430 03/19/24 1432  BP:  (!) 152/91   Pulse:  89   Resp:  17   Temp:  98 F (36.7 C)   TempSrc:  Oral   SpO2:  100%   Weight: 63 kg  68.9 kg  Height: 5' 9 (1.753 m)     Eyes: PERRL, lids and conjunctivae normal ENMT: Mucous membranes are moist.   Neck: normal, supple, no masses, no thyromegaly Respiratory: clear to auscultation bilaterally, no wheezing, no crackles. Normal respiratory effort. No accessory muscle use.  Cardiovascular: Regular rate and rhythm, no murmurs / rubs / gallops.  At least 2+ pitting bilateral  lower extremity edema to knees, legs appear tight from swelling.  Abdomen: no tenderness, no masses palpated. No hepatosplenomegaly  Musculoskeletal: no clubbing / cyanosis. No joint deformity upper and lower extremities.  Skin: no rashes, lesions, ulcers. No induration Neurologic: No facial asymmetry, moves extremities spontaneously, speech fluent.Christie Anderson  Psychiatric: Normal judgment and insight. Alert and oriented x 3. Normal mood.   Labs on Admission: I have personally reviewed following labs and imaging studies  CBC: Recent Labs  Lab 03/19/24 1454  WBC 8.8  NEUTROABS 6.2  HGB 12.3  HCT 38.7  MCV 96.8  PLT 283   Basic Metabolic Panel: Recent Labs  Lab 03/19/24 1454  NA 138  K 4.9  CL 100  CO2 31  GLUCOSE 189*  BUN 17  CREATININE 0.73  CALCIUM 8.8*  GFR: Estimated Creatinine Clearance: 87.9 mL/min (by C-G formula based on SCr of 0.73 mg/dL). Liver Function Tests: Recent Labs  Lab 03/19/24 1454  AST 26  ALT 38  ALKPHOS 88  BILITOT <0.2  PROT 7.1  ALBUMIN 3.6   BNP (last 3 results) Recent Labs    03/03/24 1545 03/07/24 0428 03/19/24 1454  PROBNP 4,538.0* 6,900.0* 3,027.0*   Urine analysis:    Component Value Date/Time   COLORURINE COLORLESS (A) 02/13/2024 2023   APPEARANCEUR CLEAR 02/13/2024 2023   LABSPEC 1.003 (L) 02/13/2024 2023   PHURINE 7.0 02/13/2024 2023   GLUCOSEU >=500 (A) 02/13/2024 2023   HGBUR NEGATIVE 02/13/2024 2023   BILIRUBINUR NEGATIVE 02/13/2024 2023   KETONESUR NEGATIVE 02/13/2024 2023   PROTEINUR 30 (A) 02/13/2024 2023   NITRITE NEGATIVE 02/13/2024 2023   LEUKOCYTESUR NEGATIVE 02/13/2024 2023    Radiological Exams on Admission: DG Chest 2 View Result Date: 03/19/2024 CLINICAL DATA:  Shortness of breath. Fluid retention with diffuse body swelling. Smoker. EXAM: CHEST - 2 VIEW COMPARISON:  03/03/2024 FINDINGS: Normal sized heart. Stable mild chronic interstitial lung disease with mild peribronchial thickening. Normal pulmonary  vasculature. No airspace consolidation or pleural fluid. Unremarkable bones. IMPRESSION: 1. No acute abnormality. 2. Stable mild chronic interstitial lung disease and mild chronic bronchitic changes compatible with the history of smoking. Electronically Signed   By: Elspeth Bathe M.D.   On: 03/19/2024 15:01    EKG: Independently reviewed.  Sinus rhythm, rate 81, QTc 452.  No significant change from prior.  Assessment/Plan Principal Problem:   Acute on chronic systolic CHF (congestive heart failure) (HCC) Active Problems:   Essential hypertension   Type 2 diabetes mellitus with hyperglycemia, with long-term current use of insulin  (HCC)   Assessment and Plan:  Acute on chronic systolic CHF- last echo 02/16/2024 shows EF of 35 to 40%.  Weight gain from 130lbs after recent hospitalization for same, to 152lbs today. At least 2+ bilateral pitting lower extremity edema.  Was on torsemide 40  BID, did not tolerate due to onset of dizziness dose was reduced to 20 twice daily. Chest x-ray no acute abnormality.  proBNP elevated at 3027.  Troponin 26 - EDP talked to Dr. Alvan, IV Lasix  80 3 times daily, team will see in consult in a.m, will determine if admission to [facility] for heart failure team management is needed - Strict input output, daily weights, daily BMP - Not on Jardiance  due to poorly controlled diabetes mellitus - Not on beta-blocker due to significantly reduced cardiac index -on losartan , spironolactone , resume  Hypertension-stable. - Resume losartan  50 mg daily, spironolactone  25 daily  Uncontrolled diabetes mellitus-A1c 9/23- 12. - SSI- M - Resume home Lantus  at reduced dose 20 units daily(home dose 30 units)   DVT prophylaxis: Lovenox  Code Status: FULL Family Communication: Friends at bedside Disposition Plan:  > 2 days Consults called: Cardiology Admission status: Inpt Tele  I certify that at the point of admission it is my clinical judgment that the patient will require  inpatient hospital care spanning beyond 2 midnights from the point of admission due to high intensity of service, high risk for further deterioration and high frequency of surveillance required.   Author: Tully FORBES Carwin, MD 03/19/2024 6:17 PM  For on call review [email or web address].

## 2024-03-19 NOTE — ED Provider Notes (Signed)
 Christie Anderson   CSN: [id number] Arrival date & time: 03/19/24  1418     Patient presents with: fluid retention   Christie Anderson is a 48 y.o. female.  She has a history of heart failure and had a recent admission for fluid overload.  She just saw cardiology a week ago and was starting to gain fluid again.  She was prescribed Furoscix  but was only able to tolerate 1 day of it.  It made her too dizzy.  Continue to take torsemide.  Has been gaining fluid.  Feels some chest tightness and a little bit of shortness of breath when she lays down but not as bad as when she got admitted.  She does not want it to get that bad.  {Add pertinent medical, surgical, social history, OB history to HPI:32947} HPI     Prior to Admission medications   Medication Sig Start Date End Date Taking? Authorizing Provider  Accu-Chek Softclix Lancets lancets Use 3 (three) times daily as directed to check blood sugar. 02/16/24   Fairy Frames, MD  Blood Glucose Monitoring Suppl (BLOOD GLUCOSE MONITOR SYSTEM) w/Device KIT Use 3 (three) times daily. 02/16/24   Fairy Frames, MD  Furosemide  (FUROSCIX ) 80 MG/10ML CTKT Inject 80 mg into the skin daily as needed (AS DIRECTED BY THE HEART FAILURE CLINIC). 03/12/24   Milford, Harlene HERO, FNP  Glucose Blood (BLOOD GLUCOSE TEST STRIPS) STRP Use 3 (three) times daily as directed to check blood sugar. 02/16/24   Fairy Frames, MD  Insulin  Glargine (BASAGLAR  KWIKPEN) 100 UNIT/ML Inject 30 Units into the skin every evening. May substitute as needed per insurance. 03/07/24   Shahmehdi, Adriana LABOR, MD  insulin  lispro (HUMALOG ) 100 UNIT/ML KwikPen Inject 8 Units into the skin 3 (three) times daily. Pt is taking 4-16 units based on blood sugar (sliding scale) pt does not have scale in front of her to read scale 03/07/24 06/05/24  Shahmehdi, Adriana LABOR, MD  Insulin  Pen Needle 32G X 4 MM MISC Use 3 (three) times daily. 02/16/24   Fairy Frames, MD  Insulin  Syringe-Needle U-100 (INSULIN  SYRINGE .5CC/30GX5/16) 30G X 5/16 0.5 ML MISC Use one syringe and needle per insulin  injection. 12/13/23   Grooms, [place], PA-C  Lancet Device MISC 1 each by Does not apply route 3 (three) times daily. May dispense any manufacturer covered by patient's insurance. 02/16/24   Fairy Frames, MD  losartan  (COZAAR ) 50 MG tablet Take 1 tablet (50 mg total) by mouth daily. 03/07/24   Shahmehdi, Adriana LABOR, MD  potassium chloride  SA (KLOR-CON  M) 20 MEQ tablet Take 0.5 tablets (10 mEq total) by mouth 2 (two) times daily. 03/12/24   Milford, Harlene HERO, FNP  spironolactone  (ALDACTONE ) 25 MG tablet Take 1 tablet (25 mg total) by mouth daily. 03/07/24   Shahmehdi, Seyed A, MD  torsemide (DEMADEX) 20 MG tablet Take 1 tablet (20 mg total) by mouth 2 (two) times daily. 03/12/24   Glena Harlene HERO, FNP    Allergies: Bovine (beef) protein-containing drug products, Porcine (pork) protein-containing drug products, Amoxicillin, Doxycycline, and Penicillins    Review of Systems  Constitutional:  Positive for fatigue and unexpected weight change. Negative for fever.  HENT:  Negative for sore throat.   Respiratory:  Positive for chest tightness. Negative for shortness of breath.   Cardiovascular:  Negative for chest pain.  Gastrointestinal:  Negative for abdominal pain.  Genitourinary:  Negative for dysuria.  Skin:  Negative for rash.  Updated Vital Signs BP (!) 152/91   Pulse 89   Temp 98 F (36.7 C) (Oral)   Resp 17   Ht 5' 9 (1.753 m)   Wt 68.9 kg   LMP 12/01/2011   SpO2 100%   BMI 22.45 kg/m   Physical Exam Vitals and nursing Anderson reviewed.  Constitutional:      General: She is not in acute distress.    Appearance: Normal appearance. She is well-developed.  HENT:     Head: Normocephalic and atraumatic.  Eyes:     Conjunctiva/sclera: Conjunctivae normal.  Cardiovascular:     Rate and Rhythm: Normal rate and regular rhythm.     Heart  sounds: No murmur heard. Pulmonary:     Effort: Pulmonary effort is normal. No respiratory distress.     Breath sounds: Normal breath sounds.  Abdominal:     Palpations: Abdomen is soft.     Tenderness: There is no abdominal tenderness. There is no guarding or rebound.  Musculoskeletal:        General: No swelling.     Cervical back: Neck supple.     Right lower leg: Edema present.     Left lower leg: Edema present.  Skin:    General: Skin is warm and dry.     Capillary Refill: Capillary refill takes less than 2 seconds.  Neurological:     General: No focal deficit present.     Mental Status: She is alert.     (all labs ordered are listed, but only abnormal results are displayed) Labs Reviewed  PRO BRAIN NATRIURETIC PEPTIDE - Abnormal; Notable for the following components:      Result Value   Pro Brain Natriuretic Peptide 3,027.0 (*)    All other components within normal limits  CBC WITH DIFFERENTIAL/PLATELET - Abnormal; Notable for the following components:   Abs Immature Granulocytes 0.08 (*)    All other components within normal limits  COMPREHENSIVE METABOLIC PANEL WITH GFR - Abnormal; Notable for the following components:   Glucose, Bld 189 (*)    Calcium 8.8 (*)    All other components within normal limits  TROPONIN T, HIGH SENSITIVITY - Abnormal; Notable for the following components:   Troponin T High Sensitivity 26 (*)    All other components within normal limits  TROPONIN T, HIGH SENSITIVITY    EKG: EKG Interpretation Date/Time:  Tuesday March 19 2024 14:33:24 EDT Ventricular Rate:  89 PR Interval:  160 QRS Duration:  66 QT Interval:  372 QTC Calculation: 452 R Axis:   30  Text Interpretation: Normal sinus rhythm Possible Left atrial enlargement Low voltage QRS Septal infarct , age undetermined Abnormal ECG When compared with ECG of 12-Mar-2024 14:53, No significant change since last tracing Confirmed by Towana Sharper [phone]) on 03/19/2024 3:02:27  PM  Radiology: DG Chest 2 View Result Date: 03/19/2024 CLINICAL DATA:  Shortness of breath. Fluid retention with diffuse body swelling. Smoker. EXAM: CHEST - 2 VIEW COMPARISON:  03/03/2024 FINDINGS: Normal sized heart. Stable mild chronic interstitial lung disease with mild peribronchial thickening. Normal pulmonary vasculature. No airspace consolidation or pleural fluid. Unremarkable bones. IMPRESSION: 1. No acute abnormality. 2. Stable mild chronic interstitial lung disease and mild chronic bronchitic changes compatible with the history of smoking. Electronically Signed   By: Elspeth Bathe M.D.   On: 03/19/2024 15:01    {Document cardiac monitor, telemetry assessment procedure when appropriate:32947} Procedures   Medications Ordered in the ED - No data to display  Clinical Course  as of 03/19/24 1644  Tue Mar 19, 2024  1637 I discussed with cardiology Dr. Alvan who recommended admission for IV diuresis with 80 mg Lasix .  Discussed with [facility] hospitalist Dr. Pearlean who will evaluate for admission. [MB]    Clinical Course User Index [MB] Towana Ozell BROCKS, MD   {Click here for ABCD2, HEART and other calculators REFRESH Anderson before signing:1}                              Medical Decision Making Amount and/or Complexity of Data Reviewed Labs: ordered. Radiology: ordered.   This patient complains of ***; this involves an extensive number of treatment Options and is a complaint that carries with it a high risk of complications and morbidity. The differential includes ***  I ordered, reviewed and interpreted labs, which included *** I ordered medication *** and reviewed PMP when indicated. I ordered imaging studies which included *** and I independently    visualized and interpreted imaging which showed *** Additional history obtained from *** Previous records obtained and reviewed *** I consulted *** and discussed lab and imaging findings and discussed disposition.  Cardiac  monitoring reviewed, *** Social determinants considered, *** Critical Interventions: ***  After the interventions stated above, I reevaluated the patient and found *** Admission and further testing considered, ***   {Document critical care time when appropriate  Document review of labs and clinical decision tools ie CHADS2VASC2, etc  Document your independent review of radiology images and any outside records  Document your discussion with family members, caretakers and with consultants  Document social determinants of health affecting pt's care  Document your decision making why or why not admission, treatments were needed:32947:::1}   Final diagnoses:  None    ED Discharge Orders     None

## 2024-03-19 NOTE — ED Triage Notes (Signed)
 Pt arrived via POV c/o fluid retention and swelling throughout her whole body but reports it is worse in her lower extremities.

## 2024-03-19 NOTE — ED Triage Notes (Signed)
 Pt reports recent changes to her medications and reports she started taking her Torsemide on Sunday.

## 2024-03-20 ENCOUNTER — Other Ambulatory Visit: Payer: Self-pay

## 2024-03-20 LAB — GLUCOSE, CAPILLARY
Glucose-Capillary: 143 mg/dL — ABNORMAL HIGH (ref 70–99)
Glucose-Capillary: 156 mg/dL — ABNORMAL HIGH (ref 70–99)
Glucose-Capillary: 169 mg/dL — ABNORMAL HIGH (ref 70–99)
Glucose-Capillary: 276 mg/dL — ABNORMAL HIGH (ref 70–99)
Glucose-Capillary: 281 mg/dL — ABNORMAL HIGH (ref 70–99)

## 2024-03-20 LAB — CBC
HCT: 32.9 % — ABNORMAL LOW (ref 36.0–46.0)
Hemoglobin: 10.6 g/dL — ABNORMAL LOW (ref 12.0–15.0)
MCH: 30.6 pg (ref 26.0–34.0)
MCHC: 32.2 g/dL (ref 30.0–36.0)
MCV: 95.1 fL (ref 80.0–100.0)
Platelets: 234 K/uL (ref 150–400)
RBC: 3.46 MIL/uL — ABNORMAL LOW (ref 3.87–5.11)
RDW: 12.4 % (ref 11.5–15.5)
WBC: 8.1 K/uL (ref 4.0–10.5)
nRBC: 0 % (ref 0.0–0.2)

## 2024-03-20 LAB — BASIC METABOLIC PANEL WITH GFR
Anion gap: 8 (ref 5–15)
BUN: 17 mg/dL (ref 6–20)
CO2: 28 mmol/L (ref 22–32)
Calcium: 8.5 mg/dL — ABNORMAL LOW (ref 8.9–10.3)
Chloride: 100 mmol/L (ref 98–111)
Creatinine, Ser: 0.6 mg/dL (ref 0.44–1.00)
GFR, Estimated: >60 mL/min (ref >60)
Glucose, Bld: 165 mg/dL — ABNORMAL HIGH (ref 70–99)
Potassium: 4.4 mmol/L (ref 3.5–5.1)
Sodium: 136 mmol/L (ref 135–145)

## 2024-03-20 LAB — MAGNESIUM: Magnesium: 2.2 mg/dL (ref 1.7–2.4)

## 2024-03-20 LAB — POTASSIUM: Potassium: 5 mmol/L (ref 3.5–5.1)

## 2024-03-20 NOTE — Progress Notes (Signed)
 Mobility Specialist Progress Note:    03/20/24 1300  Mobility  Activity Ambulated with assistance  Level of Assistance Independent  Assistive Device None  Distance Ambulated (ft) 900 ft  Range of Motion/Exercises Active;All extremities  Activity Response Tolerated well  Mobility Referral Yes  Mobility visit 1 Mobility  Mobility Specialist Start Time (ACUTE ONLY) 1300  Mobility Specialist Stop Time (ACUTE ONLY) 1320  Mobility Specialist Time Calculation (min) (ACUTE ONLY) 20 min   Pt received getting coffee, agreeable to mobility. Independently able to stand and ambulate with no AD. Tolerated well, asx throughout. Left in room with visitor, all needs met.   Preslie Depasquale Mobility Specialist Please contact via Special Educational Needs Teacher or  Rehab office at [phone]

## 2024-03-20 NOTE — Progress Notes (Signed)
 Mobility Specialist Progress Note:    03/20/24 0840  Mobility  Activity Ambulated with assistance  Level of Assistance Independent  Assistive Device None  Distance Ambulated (ft) 180 ft  Range of Motion/Exercises Active;All extremities  Activity Response Tolerated well  Mobility Referral Yes  Mobility visit 1 Mobility  Mobility Specialist Start Time (ACUTE ONLY) 0840  Mobility Specialist Stop Time (ACUTE ONLY) 0900  Mobility Specialist Time Calculation (min) (ACUTE ONLY) 20 min   Pt received standing at doorway, wanting to walk to get coffee. Independently able to stand and ambulate with no AD. Tolerated well,asx throughout. Returned to room, all needs met.  Tametra Ahart Mobility Specialist Please contact via Special Educational Needs Teacher or  Rehab office at [phone]

## 2024-03-20 NOTE — Discharge Instructions (Signed)
 Food Agency Name: [facility] Address: [address] [place], [place] [address] Phone: [phone] Website: www.adtsrc.org Services Offered: Meals on [employer] and Meals with Friends.   Home care, at home assisted living, volunteer services, Center  for Active Retirement, transportation  Agency Name: [facility] Address: Sites vary. Must call first. Food Pantry location: [address], [place], [place] [address] [employer] Phone: [phone] Website: none Services Offered: Museum/gallery curator, utility assistance if funds available Careers adviser for all of [place], [employer], [employer], Office manager and [employer] for [employer] area only) Walk-in  current Id and current address verification required.  Wed-Thurs: 9:30-12:00 Agency Name: [facility] Address: [address], [place], [place] [address]  Phone: [phone] or [phone] Website: none Services Offered: Food assistance Agency Name: [facility] Address: [address] [place], [place] [address]  Phone: [phone] or [phone]  Website: none Services Offered: Serves 1 hot meal a day at 11:00 am Monday-Sunday and 5 pm  on the second and fourth Sunday of each month Agency Name: [facility] Department of Health and [facility]  Services/Social Services  Address: [phone], [place], [place] [address] Phone: [phone] Website: [email or web address].[email or web address].[email or web address]  [email or web address] Services Offered: Sales executive, [facility] program Agency Name: [facility] Address: [address] [place], [place] [address] Phone: [phone] Website: www.rock[place]hope.[email or web address] Services Offered: Food pantry Tuesday, Wednesday and Thursday 9am-11:30am  (need appointment) and health clinic (9:00am-11:00am) Agency Name: [employer] [facility] Address: [address], [place] / [address], [place] Phone: [phone] [place] / [phone] [place] Website: OpinionTrades.tn NetworkAffair.co.za Services Offered: Civil Service fast streamer, food pantry, soup kitchen ([place]) emergency financial  assistance, thrift stores, showers & hygiene products ([place]),  Christmas Assistance, spiritual help

## 2024-03-20 NOTE — Progress Notes (Signed)
 Patient has expressed concerns about insulin  dosing and administration. She states she is not a new diabetic but that she is relatively new to inulin and would like more education. Diabetes coordinator consult order was placed.

## 2024-03-20 NOTE — Progress Notes (Signed)
 PROGRESS NOTE    Christie Anderson  FMW:[id number] DOB: 01/10/1976 DOA: 03/19/2024 PCP: Mancil Pfeiffer, PA-C   Brief Narrative:   Christie Anderson is a 48 y.o. female with medical history significant for diastolic and systolic CHF, hypertension and diabetes mellitus. Patient presented to the ED with complaints of progressive weight gain, with bilateral lower extremity swelling, bloating - thighs, abdomen, since recent hospitalization.  She reports some chest heaviness when she lies down otherwise she is not short of breath.  No chest pain.  She reports weight has gone from 134 when she was discharged from the hospital to 152 today.  Recent hospitalization 10/12 to 10/16 for decompensated CHF, she was diuresed.  Per charts she was 130.9 pounds on discharge.  She was discharged on torsemide 40 mg twice daily. She is weighing 152 pounds today.  She reports she onset of dizziness while on 40 mg torsemide BID and so dose was decreased to 20 mg BID by her cardiologist.  She was started on Furoscix  80 mg subcut injection by heart failure clinic, she was only able to take 2 doses.   ED Course: Temperature 98.  Heart rate 89.  Respirate rate 17.  Blood pressure 152/91, O2 sat 100% on room air. proBNP elevated at 3027. Chest x-ray without acute abnormality, shows mild chronic interstitial lung disease bronchitic changes compatible with smoking. Patient admitted for decompensated heart failure requiring iv diuresis.   Assessment & Plan:   Principal Problem:   Acute on chronic systolic CHF (congestive heart failure) (HCC) Active Problems:   Essential hypertension   Type 2 diabetes mellitus with hyperglycemia, with long-term current use of insulin  (HCC)    Acute on chronic systolic CHF- last echo 02/16/2024 shows EF of 35 to 40%.  Weight gain from 130lbs after recent hospitalization for same, to 152lbs today. At least 2+ bilateral pitting lower extremity edema.  Was on torsemide 40  BID, did not  tolerate due to onset of dizziness dose was reduced to 20 twice daily. Chest x-ray no acute abnormality.  proBNP elevated at 3027.  Troponin 26 - Cardiology consult pending, continue IV Lasix  80 mg 3 times daily.  Creatinine 0.6, potassium 4.4, sodium 136 today.  Negative output was 1.9 L overnight. - Strict input output, daily weights, daily BMP - Not on Jardiance  due to poorly controlled diabetes mellitus - Not on beta-blocker due to significantly reduced cardiac index -on losartan , spironolactone ,   Hypertension-stable. -  losartan  50 mg daily, spironolactone  25 daily  Chronic anemia: Hemoglobin 12 on admission, down to 10.6 today.  Will monitor daily   Uncontrolled diabetes mellitus-A1c 9/23- 12. - SSI- M - Resume home Lantus  at reduced dose 20 units daily(home dose 30 units)     DVT prophylaxis: Lovenox  Code Status: FULL Family Communication: None  disposition Plan:  > 2 days Consults called: Cardiology Admission status: Inpt Tele  I certify that at the point of admission it is my clinical judgment that the patient will require inpatient hospital care spanning beyond 2 midnights from the point of admission due to high intensity of service, high risk for further deterioration and high frequency of surveillance required.    Subjective:  Patient seen and examined at the bedside.  She feels better.  Lower extremity edema is improving.  She is up and ambulatory, denies dizziness today.  Vital signs have been stable. Objective: Vitals:   03/19/24 2124 03/20/24 0217 03/20/24 0528 03/20/24 1509  BP: 138/87 121/62 134/70 123/74  Pulse: 94  90 87 79  Resp: 18 16    Temp: 98.2 F (36.8 C) 98.7 F (37.1 C) 97.6 F (36.4 C) 98.1 F (36.7 C)  TempSrc: Oral Oral Oral Oral  SpO2: 98% 98% 99% 99%  Weight:   65.2 kg   Height:        Intake/Output Summary (Last 24 hours) at 03/20/2024 1609 Last data filed at 03/20/2024 1300 Gross per 24 hour  Intake 960 ml  Output 2200 ml  Net  -1240 ml   Filed Weights   03/19/24 1432 03/19/24 1819 03/20/24 0528  Weight: 68.9 kg 67.7 kg 65.2 kg    Examination:  General exam: Appears calm and comfortable  Respiratory system: Bilateral decreased breath sounds at bases Cardiovascular system: S1 & S2 heard, Rate controlled Gastrointestinal system: Abdomen is nondistended, soft and nontender. Normal bowel sounds heard. Extremities: No cyanosis, clubbing, edema  Central nervous system: Alert and oriented. No focal neurological deficits. Moving extremities Skin: No rashes, lesions or ulcers Psychiatry: Judgement and insight appear normal. Mood & affect appropriate.     Data Reviewed: I have personally reviewed following labs and imaging studies  CBC: Recent Labs  Lab 03/19/24 1454 03/20/24 0421  WBC 8.8 8.1  NEUTROABS 6.2  --   HGB 12.3 10.6*  HCT 38.7 32.9*  MCV 96.8 95.1  PLT 283 234   Basic Metabolic Panel: Recent Labs  Lab 03/19/24 1454 03/20/24 0421  NA 138 136  K 4.9 4.4  CL 100 100  CO2 31 28  GLUCOSE 189* 165*  BUN 17 17  CREATININE 0.73 0.60  CALCIUM 8.8* 8.5*   GFR: Estimated Creatinine Clearance: 86.6 mL/min (by C-G formula based on SCr of 0.6 mg/dL). Liver Function Tests: Recent Labs  Lab 03/19/24 1454  AST 26  ALT 38  ALKPHOS 88  BILITOT <0.2  PROT 7.1  ALBUMIN 3.6   No results for input(s): LIPASE, AMYLASE in the last 168 hours. No results for input(s): AMMONIA in the last 168 hours. Coagulation Profile: No results for input(s): INR, PROTIME in the last 168 hours. Cardiac Enzymes: No results for input(s): CKTOTAL, CKMB, CKMBINDEX, TROPONINI in the last 168 hours. BNP (last 3 results) Recent Labs    03/03/24 1545 03/07/24 0428 03/19/24 1454  PROBNP 4,538.0* 6,900.0* 3,027.0*   HbA1C: No results for input(s): HGBA1C in the last 72 hours. CBG: Recent Labs  Lab 03/19/24 2116 03/20/24 0035 03/20/24 0753 03/20/24 1156  GLUCAP 260* 169* 143* 281*    Lipid Profile: No results for input(s): CHOL, HDL, LDLCALC, TRIG, CHOLHDL, LDLDIRECT in the last 72 hours. Thyroid Function Tests: No results for input(s): TSH, T4TOTAL, FREET4, T3FREE, THYROIDAB in the last 72 hours. Anemia Panel: No results for input(s): VITAMINB12, FOLATE, FERRITIN, TIBC, IRON, RETICCTPCT in the last 72 hours. Sepsis Labs: No results for input(s): PROCALCITON, LATICACIDVEN in the last 168 hours.  No results found for this or any previous visit (from the past 240 hours).       Radiology Studies: DG Chest 2 View Result Date: 03/19/2024 CLINICAL DATA:  Shortness of breath. Fluid retention with diffuse body swelling. Smoker. EXAM: CHEST - 2 VIEW COMPARISON:  03/03/2024 FINDINGS: Normal sized heart. Stable mild chronic interstitial lung disease with mild peribronchial thickening. Normal pulmonary vasculature. No airspace consolidation or pleural fluid. Unremarkable bones. IMPRESSION: 1. No acute abnormality. 2. Stable mild chronic interstitial lung disease and mild chronic bronchitic changes compatible with the history of smoking. Electronically Signed   By: Elspeth Robynn HERO.D.  On: 03/19/2024 15:01        Scheduled Meds:  enoxaparin  (LOVENOX ) injection  40 mg Subcutaneous Q24H   furosemide   80 mg Intravenous TID   insulin  aspart  0-15 Units Subcutaneous TID WC   insulin  aspart  0-5 Units Subcutaneous QHS   insulin  glargine-yfgn  20 Units Subcutaneous QPM   losartan   50 mg Oral Daily   spironolactone   25 mg Oral Daily   Continuous Infusions:        Derryl Duval, MD [facility] 03/20/2024, 4:09 PM

## 2024-03-20 NOTE — Plan of Care (Signed)
   Problem: Activity: Goal: Risk for activity intolerance will decrease Outcome: Progressing   Problem: Coping: Goal: Level of anxiety will decrease Outcome: Progressing

## 2024-03-20 NOTE — Consult Note (Addendum)
 Cardiology Consultation   Patient ID: Christie Anderson MRN: [id number]; DOB: 1976-03-18  Admit date: 03/19/2024 Date of Consult: 03/20/2024  PCP:  Mancil Pfeiffer, PA-C   [facility] Providers Cardiologist: Advanced Heart Failure   Patient Profile: Christie Anderson is a 48 y.o. 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 Mcarthur.  History of Present Illness:  Ms. Christie Anderson was initially diagnosed with HFrEF during admission in 01/2024 and underwent RHC during that time which showed severely reduced filling pressures and reduced cardiac index. Endomyocardial biopsy was performed during that admission and showed no evidence of infiltrative disease. Was discharged home on Lasix  40 mg daily, Entresto  24-26 mg twice daily and Spironolactone  25 mg daily.  She had a recurrent admission at [facility] from 10/12 - 03/07/2024 for a CHF exacerbation as her initial discharge weight in 01/2024 have been 120 lbs and weight had increased to 148 lbs at the time of her recurrent admission. GDMT had been limited as Entresto  was changed to Losartan  as an outpatient due to cost and Jardiance  had been stopped during her prior admission given severely poorly controlled Type II DM. Had also not been on beta-blocker therapy due to low cardiac index. Was insistent on going home on 03/07/2024 and weight at that time was 130 lbs. Was discharged on Torsemide 40 mg twice daily, Losartan  50 mg daily and Spironolactone  25 mg daily.  She did follow-up with the [facility] on 03/12/2024 and had reduced Torsemide to 20 mg twice daily due to nausea.  Weight was at 138 lbs and Reds vest was elevated at 40. She was prescribed Furoscix  to use for 3 days and then resume Torsemide 20 mg twice  daily. Was recommend to consider advanced therapies in the near future. She did contact the office and said she only used 2 out of the 3 doses of Furoscix  due to dizziness and nausea.  Presented back to [facility] ED yesterday afternoon for evaluation of worsening chest tightness, shortness of breath and fluid retention. Initial labs showed WBC 8.8, Hgb 12.3, platelets 283, Na+ 138, K+ 4.9 and creatinine 0.73.  AST 26 and ALT 38. Initial and repeat Troponin T flat at 26 and 30. Pro BNP at 3027 (previously 6900 earlier this month). EKG showed normal sinus rhythm, heart rate 89 with no acute ST changes. CXR showed stable mild chronic interstitial lung disease and mild bronchitic changes but no acute findings.  The ED provider did review with Dr. Alvan and was recommended to start IV Lasix  80 mg 3 times daily. Repeat labs today show creatinine is stable at 0.60 and K+ is at 4.4. Weight was listed as 149 lbs yesterday and now at 143 lbs. Recorded net output of -1.7 L thus far.   In talking the patient today, she reports that she could tell that her breathing was getting heavy in the days leading to admission.  Reports having two-pillow orthopnea along with episodes of PND. No chest pain or palpitations. Has been taking Torsemide 20 mg twice daily as she had dizziness with higher doses.  Has been weighing herself at home and notes her weight has increased over the past week but she did not try taking extra doses of Torsemide as she reports not being  told she could do this in the past. She does try to follow a low-sodium diet and limits her fluid intake.  Past Medical History:  Diagnosis Date   CHF (congestive heart failure) (HCC)    Diabetes (HCC)    Hypertension     Past Surgical History:  Procedure Laterality Date   ENDOMYOCARDIAL BIOPSY N/A 02/16/2024   Procedure: ENDOMYOCARDIAL BIOPSY;  Surgeon: Gardenia Led, DO;  Location: [facility] INVASIVE CV LAB;  Service: Cardiovascular;  Laterality: N/A;    RIGHT HEART CATH N/A 02/16/2024   Procedure: RIGHT HEART CATH;  Surgeon: Gardenia Led, DO;  Location: [facility] INVASIVE CV LAB;  Service: Cardiovascular;  Laterality: N/A;     Home Medications:  Prior to Admission medications   Medication Sig Start Date End Date Taking? Authorizing Provider  Accu-Chek Softclix Lancets lancets Use 3 (three) times daily as directed to check blood sugar. 02/16/24   Fairy Frames, MD  Blood Glucose Monitoring Suppl (BLOOD GLUCOSE MONITOR SYSTEM) w/Device KIT Use 3 (three) times daily. 02/16/24   Fairy Frames, MD  Furosemide  (FUROSCIX ) 80 MG/10ML CTKT Inject 80 mg into the skin daily as needed (AS DIRECTED BY THE HEART FAILURE CLINIC). 03/12/24   [place], Harlene HERO, FNP  Glucose Blood (BLOOD GLUCOSE TEST STRIPS) STRP Use 3 (three) times daily as directed to check blood sugar. 02/16/24   Fairy Frames, MD  Insulin  Glargine (BASAGLAR  KWIKPEN) 100 UNIT/ML Inject 30 Units into the skin every evening. May substitute as needed per insurance. 03/07/24   Shahmehdi, Adriana LABOR, MD  insulin  lispro (HUMALOG ) 100 UNIT/ML KwikPen Inject 8 Units into the skin 3 (three) times daily. Pt is taking 4-16 units based on blood sugar (sliding scale) pt does not have scale in front of her to read scale 03/07/24 06/05/24  Willette Adriana LABOR, MD  Insulin  Pen Needle 32G X 4 MM MISC Use 3 (three) times daily. 02/16/24   Fairy Frames, MD  Insulin  Syringe-Needle U-100 (INSULIN  SYRINGE .5CC/30GX5/16) 30G X 5/16 0.5 ML MISC Use one syringe and needle per insulin  injection. 12/13/23   [place]rooms, [place], PA-C  Lancet Device MISC 1 each by Does not apply route 3 (three) times daily. May dispense any manufacturer covered by patient's insurance. 02/16/24   Fairy Frames, MD  losartan  (COZAAR ) 50 MG tablet Take 1 tablet (50 mg total) by mouth daily. 03/07/24   Shahmehdi, Adriana LABOR, MD  potassium chloride  SA (KLOR-CON  M) 20 MEQ tablet Take 0.5 tablets (10 mEq total) by mouth 2 (two) times daily. 03/12/24    [place]ford, Harlene HERO, FNP  spironolactone  (ALDACTONE ) 25 MG tablet Take 1 tablet (25 mg total) by mouth daily. 03/07/24   Shahmehdi, Seyed A, MD  torsemide (DEMADEX) 20 MG tablet Take 1 tablet (20 mg total) by mouth 2 (two) times daily. 03/12/24   [place]a Harlene HERO, FNP    Scheduled Meds:  enoxaparin  (LOVENOX ) injection  40 mg Subcutaneous Q24H   furosemide   80 mg Intravenous TID   insulin  aspart  0-15 Units Subcutaneous TID WC   insulin  aspart  0-5 Units Subcutaneous QHS   insulin  glargine-yfgn  20 Units Subcutaneous QPM   losartan   50 mg Oral Daily   spironolactone   25 mg Oral Daily   Continuous Infusions:  PRN Meds: acetaminophen  **OR** acetaminophen , ondansetron  **OR** ondansetron  (ZOFRAN ) IV, polyethylene glycol  Allergies:    Allergies  Allergen Reactions   Bovine (Beef) Protein-Containing Drug Products Hives and Swelling   Porcine (Pork) Protein-Containing Drug Products Hives and Swelling   Amoxicillin Other (See  Comments)    Unknown    Doxycycline Nausea And Vomiting   Penicillins Other (See Comments)    Unknown     Social History:   Social History   Socioeconomic History   Marital status: Single    Spouse name: Not on file   Number of children: 1   Years of education: Not on file   Highest education level: GED or equivalent  Occupational History   Occupation: Unemployed  Tobacco Use   Smoking status: Every Day    Types: Cigarettes    Passive exposure: Current   Smokeless tobacco: Never  Vaping Use   Vaping status: Never Used  Substance and Sexual Activity   Alcohol use: Not Currently   Drug use: Never   Sexual activity: Not on file  Other Topics Concern   Not on file  Social History Narrative   Not on file   Social Drivers of Health   Financial Resource Strain: Medium Risk (02/26/2024)   Overall Financial Resource Strain (CARDIA)    Difficulty of Paying Living Expenses: Somewhat hard  Food Insecurity: Food Insecurity Present (03/19/2024)   Hunger  Vital Sign    Worried About Running Out of Food in the Last Year: Sometimes true    Ran Out of Food in the Last Year: Sometimes true  Transportation Needs: No Transportation Needs (03/19/2024)   PRAPARE - Administrator, Civil Service (Medical): No    Lack of Transportation (Non-Medical): No  Physical Activity: Not on file  Stress: Not on file  Social Connections: Not on file  Intimate Partner Violence: Not At Risk (03/19/2024)   Humiliation, Afraid, Rape, and Kick questionnaire    Fear of Current or Ex-Partner: No    Emotionally Abused: No    Physically Abused: No    Sexually Abused: No    Family History:    Family History  Problem Relation Age of Onset   Hypertension Mother    Stroke Mother    Stroke Paternal Grandmother      ROS:  Please see the history of present illness.   All other ROS reviewed and negative.     Physical Exam/Data: Vitals:   03/19/24 1819 03/19/24 2124 03/20/24 0217 03/20/24 0528  BP: 139/83 138/87 121/62 134/70  Pulse: 96 94 90 87  Resp:  18 16   Temp: 98.4 F (36.9 C) 98.2 F (36.8 C) 98.7 F (37.1 C) 97.6 F (36.4 C)  TempSrc: Oral Oral Oral Oral  SpO2: 99% 98% 98% 99%  Weight: 67.7 kg   65.2 kg  Height: 5' 9 (1.753 m)       Intake/Output Summary (Last 24 hours) at 03/20/2024 0958 Last data filed at 03/20/2024 0557 Gross per 24 hour  Intake 240 ml  Output 2200 ml  Net -1960 ml      03/20/2024    5:28 AM 03/19/2024    6:19 PM 03/19/2024    2:32 PM  Last 3 Weights  Weight (lbs) 143 lb 11.8 oz 149 lb 4 oz 152 lb  Weight (kg) 65.2 kg 67.7 kg 68.947 kg     Body mass index is 21.23 kg/m.  General:  Well nourished, well developed female appearing in no acute distress HEENT: normal Neck: JVD elevated to jaw-line.  Vascular: No carotid bruits; Distal pulses 2+ bilaterally Cardiac:  normal S1, S2; RRR; no murmur  Lungs: Decreased breath sounds along bases.  No wheezing. Abd: soft, nontender, no hepatomegaly  Ext: 1+  pitting edema bilaterally.  Musculoskeletal:  No deformities, BUE and BLE strength normal and equal Skin: warm and dry  Neuro:  CNs 2-12 intact, no focal abnormalities noted Psych:  Normal affect   EKG:  The EKG was personally reviewed and demonstrates: NSR, HR 89 with no acute ST changes. Telemetry:  Telemetry was personally reviewed and demonstrates:  NSR, HR in 80's to 90's with occasional PVC's.   Relevant CV Studies:  Echocardiogram: 01/2024 IMPRESSIONS     1. Left ventricular ejection fraction, by estimation, is 35 to 40%. The  left ventricle has moderately decreased function. The left ventricle  demonstrates global hypokinesis. There is moderate concentric left  ventricular hypertrophy. Left ventricular  diastolic parameters are consistent with Grade I diastolic dysfunction  (impaired relaxation). Elevated left atrial pressure. The average left  ventricular global longitudinal strain is -13.5 %. The global longitudinal  strain is abnormal.   2. Right ventricular systolic function is mildly reduced. The right  ventricular size is normal. Tricuspid regurgitation signal is inadequate  for assessing PA pressure.   3. Left atrial size was mildly dilated.   4. Large pleural effusion in the left lateral region.   5. The mitral valve is normal in structure. Trivial mitral valve  regurgitation.   6. The aortic valve is tricuspid. Aortic valve regurgitation is not  visualized. No aortic stenosis is present.   7. The inferior vena cava is normal in size with greater than 50%  respiratory variability, suggesting right atrial pressure of 3 mmHg.   Comparison(s): Marked LVH with low QRS voltage on ECG suggests  infiltrative cardiomyopathy, in particular cardiac amyloidosis.   cMRI: 01/2024 IMPRESSION: 1. Suboptimal image quality affects quality of delayed enhancement images.   2. Moderately reduced left ventricular systolic function with mild dilation, LVEF 32%. Nonspecific  pattern of delayed enhancement with midwall septal scar, which can be seen in nonischemic cardiomyopathy.   3. ECV with nonspecific elevation, 32%. No definite findings of cardiac amyloidosis. Given suboptimal image quality, would consider PYP scan and myeloma labs to definitively evaluate for amyloidosis.   4. Mildly reduced RV systolic function with normal RV chamber size. RVEF 39%. No delayed myocardial enhancement.   5.  Moderate right pleural effusion   RHC: 01/2024 HEMODYNAMICS: RA:                  3 mmHg (mean) RV:                  19/5 mmHg PA:                  17/10 mmHg (13 mean) PCWP:            6 mmHg (mean)                                      Estimated Fick CO/CI   3.9 L/min, 2.28 L/min/m2 Thermodilution CO/CI   2.3 L/min, 1.2 L/min/m2                                              TPG                 7  mmHg  PVR                 1.8-3 Wood Units  PAPi                2.5     Post biopsy RA:     IMPRESSION: Low pre and post capillary filling pressures Severely reduced cardiac index by TD; moderately reduced by TD Three RV endomyocardial samples obtained under echo guidance. No pericardial effusion or TR pre or post procedure.  Laboratory Data: High Sensitivity Troponin:  No results for input(s): TROPONINIHS in the last 720 hours.   Chemistry Recent Labs  Lab 03/19/24 1454 03/20/24 0421  NA 138 136  K 4.9 4.4  CL 100 100  CO2 31 28  GLUCOSE 189* 165*  BUN 17 17  CREATININE 0.73 0.60  CALCIUM 8.8* 8.5*  GFRNONAA >60 >60  ANIONGAP 7 8    Recent Labs  Lab 03/19/24 1454  PROT 7.1  ALBUMIN 3.6  AST 26  ALT 38  ALKPHOS 88  BILITOT <0.2   Lipids No results for input(s): CHOL, TRIG, HDL, LABVLDL, LDLCALC, CHOLHDL in the last 168 hours.  Hematology Recent Labs  Lab 03/19/24 1454 03/20/24 0421  WBC 8.8 8.1  RBC 4.00 3.46*  HGB 12.3 10.6*  HCT 38.7 32.9*  MCV 96.8 95.1  MCH 30.8  30.6  MCHC 31.8 32.2  RDW 12.4 12.4  PLT 283 234   Thyroid No results for input(s): TSH, FREET4 in the last 168 hours.  BNP Recent Labs  Lab 03/19/24 1454  PROBNP 3,027.0*    DDimer No results for input(s): DDIMER in the last 168 hours.  Radiology/Studies:  DG Chest 2 View Result Date: 03/19/2024 CLINICAL DATA:  Shortness of breath. Fluid retention with diffuse body swelling. Smoker. EXAM: CHEST - 2 VIEW COMPARISON:  03/03/2024 FINDINGS: Normal sized heart. Stable mild chronic interstitial lung disease with mild peribronchial thickening. Normal pulmonary vasculature. No airspace consolidation or pleural fluid. Unremarkable bones. IMPRESSION: 1. No acute abnormality. 2. Stable mild chronic interstitial lung disease and mild chronic bronchitic changes compatible with the history of smoking. Electronically Signed   By: Elspeth Bathe M.D.   On: 03/19/2024 15:01     Assessment and Plan:  1. Acute HFrEF - Most recent echocardiogram showed her EF was reduced at 35 to 40% and RHC in 01/2024 showed severely reduced filling pressures and severely reduced cardiac index. Endomyocardial biopsy was not consistent with cardiac amyloid. - Presents with worsening dyspnea on exertion, orthopnea and lower extremity edema with an associated 15+ pound weight gain on her home scales. As discussed above, outpatient diuresis has been challenging as she reports frequent dizziness with diuretic therapy.  Was taking Torsemide 20 mg twice daily at home and says she was unaware to take extra doses if needed. Intolerant to Furoscix  due to dizziness and presyncope. - She is responding well to IV Lasix  80 mg 3 times daily at this time and reports significant improvement in symptoms thus far. If she continues to have adequate urine output and renal function remains stable, suspect that she can remain here at [facility] for IV diuresis. Otherwise, will need to transfer to [facility] for AHF evaluation. Anticipate she  will require a higher standing dose of Torsemide at home but was previously intolerant to 40 mg twice daily. May need to discharge on 40 mg in AM/20 mg in PM and we reviewed that she should take an extra 20 mg if needed for weight  gain greater than 2 pounds overnight or 5 pounds in 1 week. She has been continued on Losartan  50 mg daily and Spironolactone  25 mg daily. Not on a beta-blocker due to low cardiac index and was previously unable to afford Entresto  given lack of insurance coverage. Jardiance  was also discontinued during her prior admission given poorly controlled Type II DM and no longer on beta-blocker therapy due to low cardiac index.  2. HTN - BP has overall been well-controlled, at 134/70 on most recent check. She has been continued on Losartan  50 mg daily and Spironolactone  25 mg daily.  3. Uncontrolled Type 2 DM - Hgb A1c was at 12.6 when checked in 01/2022 (previously 14.4). Management per the admitting team.    Risk Assessment/Risk Scores:  [place]  Heart Association (NYHA) Functional Class NYHA Class IV   For questions or updates, please contact [facility] Please consult [email or web address] for contact info under   Signed, Laymon CHRISTELLA Qua, PA-C  03/20/2024 9:58 AM  Attending Note  Patient seen and discussed with PA Qua, I agree with her documentation.  48 yo female history of chronic HFrEF LVE 35-40%, DM2, with roughly 20 lbs weight gain, edema, SOB.  Admit 01/2024 with new diagnosis of HFrEF. Diuresed close to 50 lbs. Given LVH and low voltage on EKG questions about possible infiltrative process. Discharge weight was 02/16/24 120 lbs. Readmitted 03/03/24 with fluid overload, had gained 28 lbs. Diuresed, she requested to be discharged prior to full diuresis and was 130 lbs at discharge 03/07/24. Presented again yesterday with recurrent fluid overload, weight around 150 lbs, up 20 lbs since last discharge.  Seen HF clinic 03/12/24, She had lowered her torsemide  from 40mg  bid to 20mg  bid due to nausea. Weight gain at that time, plan was for outpatient [place] lasix . Reports side effects to [place] lasix .    1.Acute on chronic HFrEF - new diagnosis of HFrEF during 01/2024 admission, diuresed 50 lbs 01/2024 echo: LVEF 35-40%, mod LVH, grade I dd, mild RV dysfunction 01/2024 cMRI: suboptimal study, LVE 32%, nonspecific delayed enhancement. No specific findings of amyloid though suboptimal imaging 01/2024 RHC/bx: mean PA 13, PCWP 6, CI 2.3 by Fick and 1.2 by thermodilution 01/2024 limited echo :LVEF 35-40% 01/2024 endomyocardial biopsy: benign myocardium, no amyloid.   -presents again with recurrent weight gain, edema.  - started on IV lasix  80mg  tid. I/Os suggest negative 2 L, weight is 149 lbs -->143 lbs. From 01/2024 discharge she was down to 120 lbs. Renal function is stable. Continue IV diuresis, remains fluid overloaded.  - side effects to torsemide 40mg  bid, tolerated 20mg  bid. Side effects to [place] home lasix . Likely would plan for torsemide 40mg  in AM and 20mg  in PM at discharge, likely discuss with HF team close to d/c if any other potential suggestions.   - entresto  changed to losartan  at HF clinic due to cost - jardiance  was stopped during prior admission due to severely poor contolled DM2 - beta blocker avoid  due to low CI - from last HF clinic note may need to consider advanced therapies in the near future  Dorn Ross MD

## 2024-03-20 NOTE — Inpatient Diabetes Management (Addendum)
 Inpatient Diabetes Program Recommendations  AACE/ADA: New Consensus Statement on Inpatient Glycemic Control (2015)  Target Ranges:  Prepandial:   less than 140 mg/dL      Peak postprandial:   less than 180 mg/dL (1-2 hours)      Critically ill patients:  140 - 180 mg/dL   Lab Results  Component Value Date   GLUCAP 143 (H) 03/20/2024   HGBA1C 12.6 (H) 02/13/2024    Review of Glycemic Control  Latest Reference Range & Units 03/19/24 21:16 03/20/24 00:35 03/20/24 07:53  Glucose-Capillary 70 - 99 mg/dL 739 (H) 830 (H) 856 (H)   Diabetes history: DM 2 Outpatient Diabetes medications:  Basaglar  30 units daily Humalog  4-16 units tid with meals  Current orders for Inpatient glycemic control:  Novolog  0-15 units tid with meals and HS Semglee  20 units q PM  Inpatient Diabetes Program Recommendations:    Agree with current orders.  If post-prandial CBG's >180 mg/dL, consider also adding Novolog  3 units tid with meals (hold if patient eats less than 50% or NPO).  Note patient was just discharged on 10/16.  Per referral she does not have scale at home for knowing how to take her Humalog .   At discharge, will need to provide her with scale on how to take her insulin .   ** scale from  PCP visit on 02/21/24 was  Short acting insulin  sliding scale with meals - 140-180= 4 units - 181- 220 = 6 units  - 221- 260= 8 units - 261- 300= 10 units - 301-350= 12 units  - 351- 400= 14 units - higher than 400 = 16 units   Thanks,  Randall Bullocks, RN, BC-ADM Inpatient Diabetes Coordinator Pager [phone]  (8a-5p)

## 2024-03-20 NOTE — Plan of Care (Signed)

## 2024-03-20 NOTE — Progress Notes (Signed)
 Heart Failure Navigator Progress Note  Assessed for Heart & Vascular TOC clinic readiness. Patient is already a Advanced Heart Failure Team patient.  She has a previously scheduled appointment on 04/01/24 @ 1:30 at [facility].    Navigator will sign of at this time.    Charmaine Pines, RN, BSN [facility] Heart Failure Navigator Secure Chat Only

## 2024-03-20 NOTE — Progress Notes (Signed)
 While performing patients q shift assessment patient stated, Do you have to do this every five hours?. Informed patient that the assessment is performed every shift with the new nurse.

## 2024-03-20 NOTE — Plan of Care (Signed)
  Problem: Education: Goal: Knowledge of General Education information will improve Description: Including pain rating scale, medication(s)/side effects and non-pharmacologic comfort measures Outcome: Progressing   Problem: Clinical Measurements: Goal: Respiratory complications will improve Outcome: Progressing Goal: Cardiovascular complication will be avoided Outcome: Progressing   Problem: Activity: Goal: Risk for activity intolerance will decrease Outcome: Progressing   Problem: Elimination: Goal: Will not experience complications related to urinary retention Outcome: Progressing   Problem: Pain Managment: Goal: General experience of comfort will improve and/or be controlled Outcome: Progressing

## 2024-03-20 NOTE — Progress Notes (Signed)
  Transition of Care [facility]) Screening Note   Patient Details  Name: Christie Anderson Date of Birth: [birth year]   Transition of Care [facility]) CM/SW Contact:    Hoy DELENA Bigness, LCSW Phone Number: 03/20/2024, 8:53 AM    Transition of Care Department [facility]) has reviewed patient and no TOC needs have been identified at this time. We will continue to monitor patient advancement through interdisciplinary progression rounds. If new patient transition needs arise, please place a TOC consult.    03/20/24 0852  TOC Brief Assessment  Insurance and Status Lapsed (Has applied for Medicaid)  Patient has primary care physician Yes  Home environment has been reviewed Home w/ 18y.o. son  Prior level of function: Independent  Prior/Current Home Services No current home services  Social Drivers of Health Review SDOH reviewed interventions complete  Readmission risk has been reviewed Yes  Transition of care needs no transition of care needs at this time

## 2024-03-21 ENCOUNTER — Ambulatory Visit (HOSPITAL_COMMUNITY): Payer: Self-pay

## 2024-03-21 DIAGNOSIS — E118 Type 2 diabetes mellitus with unspecified complications: Secondary | ICD-10-CM

## 2024-03-21 LAB — BASIC METABOLIC PANEL WITH GFR
Anion gap: 8 (ref 5–15)
BUN: 15 mg/dL (ref 6–20)
CO2: 29 mmol/L (ref 22–32)
Calcium: 8.4 mg/dL — ABNORMAL LOW (ref 8.9–10.3)
Chloride: 97 mmol/L — ABNORMAL LOW (ref 98–111)
Creatinine, Ser: 0.69 mg/dL (ref 0.44–1.00)
GFR, Estimated: >60 mL/min (ref >60)
Glucose, Bld: 184 mg/dL — ABNORMAL HIGH (ref 70–99)
Potassium: 4.5 mmol/L (ref 3.5–5.1)
Sodium: 134 mmol/L — ABNORMAL LOW (ref 135–145)

## 2024-03-21 LAB — GLUCOSE, CAPILLARY
Glucose-Capillary: 164 mg/dL — ABNORMAL HIGH (ref 70–99)
Glucose-Capillary: 264 mg/dL — ABNORMAL HIGH (ref 70–99)

## 2024-03-21 LAB — HEMOGLOBIN AND HEMATOCRIT, BLOOD
HCT: 32.3 % — ABNORMAL LOW (ref 36.0–46.0)
Hemoglobin: 10.6 g/dL — ABNORMAL LOW (ref 12.0–15.0)

## 2024-03-21 MED ORDER — INSULIN LISPRO (1 UNIT DIAL) 100 UNIT/ML (KWIKPEN): 3.0000 [IU] | PEN_INJECTOR | Freq: Three times a day (TID) | SUBCUTANEOUS | Status: AC

## 2024-03-21 MED ORDER — TORSEMIDE 20 MG PO TABS: ORAL_TABLET | ORAL | Status: DC

## 2024-03-21 NOTE — Inpatient Diabetes Management (Signed)
 Inpatient Diabetes Program Recommendations  AACE/ADA: New Consensus Statement on Inpatient Glycemic Control (2015)  Target Ranges:  Prepandial:   less than 140 mg/dL      Peak postprandial:   less than 180 mg/dL (1-2 hours)      Critically ill patients:  140 - 180 mg/dL   Lab Results  Component Value Date   GLUCAP 264 (H) 03/21/2024   HGBA1C 12.6 (H) 02/13/2024    Review of Glycemic Control  Latest Reference Range & Units 03/20/24 11:56 03/20/24 17:20 03/20/24 21:56 03/21/24 07:40 03/21/24 11:13  Glucose-Capillary 70 - 99 mg/dL 718 (H) 723 (H) 843 (H) 164 (H) 264 (H)  Diabetes history: DM 2 Outpatient Diabetes medications:  Basaglar  30 units daily Humalog  4-16 units tid with meals  Current orders for Inpatient glycemic control:  Novolog  0-15 units tid with meals and HS Semglee  20 units q PM   Inpatient Diabetes Program Recommendations:    Spoke to patient regarding her questions on insulins.  She states that she could not find the sliding scale that she was to use at home so she has been guessing.  I was able to locate the ordered insulin  on there chart from 02/21/24.  Placed in discharge education.  Also asked MD to put in medication section of discharge.  Patient appreciative of information and states that makes a lot more sense.    Thanks,  Randall Bullocks, RN, BC-ADM Inpatient Diabetes Coordinator Pager [phone]  (8a-5p)

## 2024-03-21 NOTE — Discharge Summary (Signed)
 Physician Discharge Summary  Christie Anderson DOB: 02-02-1976 DOA: 03/19/2024  PCP: Mancil Pfeiffer, PA-C  Admit date: 03/19/2024 Discharge date: 03/21/2024  Admitted From: Home Disposition: Home  Recommendations for Outpatient Follow-up:  Follow up with PCP in 1 week with repeat CBC/BMP Follow-up with cardiology as scheduled on November 10 Follow up in ED if symptoms worsen or new appear   Home Health: No Equipment/Devices: None  Discharge Condition: Stable CODE STATUS: Full Diet recommendation: Heart healthy/low salt/fluid restriction  Brief/Interim Summary: Christie Anderson is a 48 y.o. female with medical history significant for diastolic and systolic CHF, hypertension and diabetes mellitus. Patient presented to the ED with complaints of progressive weight gain, with bilateral lower extremity swelling, bloating - thighs, abdomen, since recent hospitalization.  Recent hospitalization 10/12 to 10/16 for decompensated CHF, she was diuresed.  Per charts she was 130.9 pounds on discharge down from 152.  She was discharged on torsemide 40 mg twice daily.  Her weight has increased again to 152 pounds.  She reports she onset of dizziness while on 40 mg torsemide BID and so dose was decreased to 20 mg BID by her cardiologist.  She was started on Furoscix  80 mg subcut injection by heart failure clinic, she was only able to take 2 doses.  She says she feels dizzy with Furoscix .   ED Course: Temperature 98.  Heart rate 89.  Respirate rate 17.  Blood pressure 152/91, O2 sat 100% on room air. proBNP elevated at 3027. Chest x-ray without acute abnormality, shows mild chronic interstitial lung disease bronchitic changes compatible with smoking. Patient admitted for decompensated heart failure requiring iv diuresis.  Hospital course She was evaluated by cardiology.  She was initiated on IV Lasix  80 mg 3 times daily with good response.  Weight down to 139 pounds on the day of discharge.   She is recommended for torsemide 40 mg in the a.m. and 20 mg in the p.m.  No beta-blocker due to severely reduced cardiac index.  She will continue losartan  50 mg daily, spironolactone  25 mg once daily.  She tried Furoscix  at home but did not tolerate due to dizziness.  She has rescheduled with heart failure clinic on November 10 which she will keep.  Cardiology also recommending left heart cath which has not been done before.  Patient discharged home in stable condition.  Discharge Diagnoses:  Principal Problem:   Acute on chronic systolic CHF (congestive heart failure) (HCC) Active Problems:   Essential hypertension   Type 2 diabetes mellitus with hyperglycemia, with long-term current use of insulin  [facility])    Discharge Instructions  Discharge Instructions     (HEART FAILURE PATIENTS) Call MD:  Anytime you have any of the following symptoms: 1) 3 pound weight gain in 24 hours or 5 pounds in 1 week 2) shortness of breath, with or without a dry hacking cough 3) swelling in the hands, feet or stomach 4) if you have to sleep on extra pillows at night in order to breathe.   Complete by: As directed    Call MD for:  persistant dizziness or light-headedness   Complete by: As directed    Diet - low sodium heart healthy   Complete by: As directed    Discharge instructions   Complete by: As directed    1.  Adjust diuretics to torsemide 40 mg in the a.m. and 20 mg in the p.m.  Continue spironolactone  and other cardiac medications. 2.  Follow-up with the heart failure clinic  as already scheduled. 3.  Return to the ER if recurrence of symptoms.   Increase activity slowly   Complete by: As directed       Allergies as of 03/21/2024       Reactions   Bovine (beef) Protein-containing Drug Products Hives, Swelling   Porcine (pork) Protein-containing Drug Products Hives, Swelling   Amoxicillin Other (See Comments)   Unknown    Doxycycline Nausea And Vomiting   Penicillins Other (See Comments)    Unknown         Medication List     STOP taking these medications    Furoscix  80 MG/10ML Ctkt Generic drug: Furosemide        TAKE these medications    Accu-Chek Guide Test test strip Generic drug: glucose blood Use 3 (three) times daily as directed to check blood sugar.   Accu-Chek Guide w/Device Kit Use 3 (three) times daily.   Accu-Chek Softclix Lancets lancets Use 3 (three) times daily as directed to check blood sugar.   Basaglar  KwikPen 100 UNIT/ML Inject 30 Units into the skin every evening. May substitute as needed per insurance.   HumaLOG  KwikPen 100 UNIT/ML KwikPen Generic drug: insulin  lispro Inject 8 Units into the skin 3 (three) times daily. Pt is taking 4-16 units based on blood sugar (sliding scale) pt does not have scale in front of her to read scale   INSULIN  SYRINGE .5CC/30GX5/16 30G X 5/16 0.5 ML Misc Use one syringe and needle per insulin  injection.   Lancet Device Misc 1 each by Does not apply route 3 (three) times daily. May dispense any manufacturer covered by patient's insurance.   losartan  50 MG tablet Commonly known as: COZAAR  Take 1 tablet (50 mg total) by mouth daily.   potassium chloride  SA 20 MEQ tablet Commonly known as: KLOR-CON  M Take 0.5 tablets (10 mEq total) by mouth 2 (two) times daily.   spironolactone  25 MG tablet Commonly known as: ALDACTONE  Take 1 tablet (25 mg total) by mouth daily.   TechLite Pen Needles 32G X 4 MM Misc Generic drug: Insulin  Pen Needle Use 3 (three) times daily.   torsemide 20 MG tablet Commonly known as: DEMADEX Take 2 tablets in the morning and 1 tablet in the evening. What changed:  how much to take how to take this when to take this additional instructions        Follow-up Information     [facility]. Go on 04/01/2024.   Specialty: Cardiology Why: Hospital Follow-Up 04/01/24 @ 1:30 PM Please bring all medications to follow-up  appointment [facility] , [address] off of [address] [employer] Parking at the door or use [employer] Code [phone] to park under the building. Contact information: [address] [place]  [phone]               Allergies  Allergen Reactions   Bovine (Beef) Protein-Containing Drug Products Hives and Swelling   Porcine (Pork) Protein-Containing Drug Products Hives and Swelling   Amoxicillin Other (See Comments)    Unknown    Doxycycline Nausea And Vomiting   Penicillins Other (See Comments)    Unknown     Consultations:    Procedures/Studies: DG Chest 2 View Result Date: 03/19/2024 CLINICAL DATA:  Shortness of breath. Fluid retention with diffuse body swelling. Smoker. EXAM: CHEST - 2 VIEW COMPARISON:  03/03/2024 FINDINGS: Normal sized heart. Stable mild chronic interstitial lung disease with mild peribronchial thickening. Normal  pulmonary vasculature. No airspace consolidation or pleural fluid. Unremarkable bones. IMPRESSION: 1. No acute abnormality. 2. Stable mild chronic interstitial lung disease and mild chronic bronchitic changes compatible with the history of smoking. Electronically Signed   By: Elspeth Bathe M.D.   On: 03/19/2024 15:01   DG Chest Port 1 View Result Date: 03/03/2024 CLINICAL DATA:  Swelling to the abdomen, legs and face. EXAM: PORTABLE CHEST 1 VIEW COMPARISON:  February 14, 2024 FINDINGS: The heart size and mediastinal contours are within normal limits. Mild diffuse reticular opacities are noted throughout both lungs. No focal consolidation, pleural effusion or pneumothorax is identified. The visualized skeletal structures are unremarkable. IMPRESSION: Mild diffuse reticular opacities throughout both lungs, which may represent mild interstitial edema. Electronically Signed   By: Suzen Dials M.D.   On: 03/03/2024 16:16      Subjective:   Discharge Exam: Vitals:   03/20/24 2157 03/21/24 0326   BP: 123/76 126/64  Pulse: 80 88  Resp: 18   Temp: 98.1 F (36.7 C) 98.4 F (36.9 C)  SpO2: 98% 98%    General: Pt is alert, awake, not in acute distress Cardiovascular: rate controlled, S1/S2 + Respiratory: bilateral decreased breath sounds at bases Abdominal: Soft, NT, ND, bowel sounds + Extremities: no edema, no cyanosis    The results of significant diagnostics from this hospitalization (including imaging, microbiology, ancillary and laboratory) are listed below for reference.     Microbiology: No results found for this or any previous visit (from the past 240 hours).   Labs: BNP (last 3 results) Recent Labs    02/09/24 1625 03/12/24 1540  BNP 1,135.0* 477.9*   Basic Metabolic Panel: Recent Labs  Lab 03/19/24 1454 03/20/24 0421 03/20/24 1833 03/21/24 0407  NA 138 136  --  134*  K 4.9 4.4 5.0 4.5  CL 100 100  --  97*  CO2 31 28  --  29  GLUCOSE 189* 165*  --  184*  BUN 17 17  --  15  CREATININE 0.73 0.60  --  0.69  CALCIUM 8.8* 8.5*  --  8.4*  MG  --   --  2.2  --    Liver Function Tests: Recent Labs  Lab 03/19/24 1454  AST 26  ALT 38  ALKPHOS 88  BILITOT <0.2  PROT 7.1  ALBUMIN 3.6   No results for input(s): LIPASE, AMYLASE in the last 168 hours. No results for input(s): AMMONIA in the last 168 hours. CBC: Recent Labs  Lab 03/19/24 1454 03/20/24 0421 03/21/24 0857  WBC 8.8 8.1  --   NEUTROABS 6.2  --   --   HGB 12.3 10.6* 10.6*  HCT 38.7 32.9* 32.3*  MCV 96.8 95.1  --   PLT 283 234  --    Cardiac Enzymes: No results for input(s): CKTOTAL, CKMB, CKMBINDEX, TROPONINI in the last 168 hours. BNP: Invalid input(s): POCBNP CBG: Recent Labs  Lab 03/20/24 1156 03/20/24 1720 03/20/24 2156 03/21/24 0740 03/21/24 1113  GLUCAP 281* 276* 156* 164* 264*   D-Dimer No results for input(s): DDIMER in the last 72 hours. Hgb A1c No results for input(s): HGBA1C in the last 72 hours. Lipid Profile No results for  input(s): CHOL, HDL, LDLCALC, TRIG, CHOLHDL, LDLDIRECT in the last 72 hours. Thyroid function studies No results for input(s): TSH, T4TOTAL, T3FREE, THYROIDAB in the last 72 hours.  Invalid input(s): FREET3 Anemia work up No results for input(s): VITAMINB12, FOLATE, FERRITIN, TIBC, IRON, RETICCTPCT in the last 72 hours. Urinalysis  Component Value Date/Time   COLORURINE COLORLESS (A) 02/13/2024 2023   APPEARANCEUR CLEAR 02/13/2024 2023   LABSPEC 1.003 (L) 02/13/2024 2023   PHURINE 7.0 02/13/2024 2023   GLUCOSEU >=500 (A) 02/13/2024 2023   HGBUR NEGATIVE 02/13/2024 2023   BILIRUBINUR NEGATIVE 02/13/2024 2023   KETONESUR NEGATIVE 02/13/2024 2023   PROTEINUR 30 (A) 02/13/2024 2023   NITRITE NEGATIVE 02/13/2024 2023   LEUKOCYTESUR NEGATIVE 02/13/2024 2023   Sepsis Labs Recent Labs  Lab 03/19/24 1454 03/20/24 0421  WBC 8.8 8.1   Microbiology No results found for this or any previous visit (from the past 240 hours).   Time coordinating discharge: 35 minutes  SIGNED:   Katoya Amato, MD  [facility] 03/21/2024, 11:40 AM

## 2024-03-21 NOTE — Progress Notes (Incomplete)
 PROGRESS NOTE    Christie Anderson  FMW:[id number] DOB: 1975/11/07 DOA: 03/19/2024 PCP: Mancil Pfeiffer, PA-C   Brief Narrative:   Christie Anderson is a 48 y.o. female with medical history significant for diastolic and systolic CHF, hypertension and diabetes mellitus. Patient presented to the ED with complaints of progressive weight gain, with bilateral lower extremity swelling, bloating - thighs, abdomen, since recent hospitalization.  She reports some chest heaviness when she lies down otherwise she is not short of breath.  No chest pain.  She reports weight has gone from 134 when she was discharged from the hospital to 152 today.  Recent hospitalization 10/12 to 10/16 for decompensated CHF, she was diuresed.  Per charts she was 130.9 pounds on discharge.  She was discharged on torsemide 40 mg twice daily. She is weighing 152 pounds today.  She reports she onset of dizziness while on 40 mg torsemide BID and so dose was decreased to 20 mg BID by her cardiologist.  She was started on Furoscix  80 mg subcut injection by heart failure clinic, she was only able to take 2 doses.   ED Course: Temperature 98.  Heart rate 89.  Respirate rate 17.  Blood pressure 152/91, O2 sat 100% on room air. proBNP elevated at 3027. Chest x-ray without acute abnormality, shows mild chronic interstitial lung disease bronchitic changes compatible with smoking. Patient admitted for decompensated heart failure requiring iv diuresis.   Assessment & Plan:   Principal Problem:   Acute on chronic systolic CHF (congestive heart failure) (HCC) Active Problems:   Essential hypertension   Type 2 diabetes mellitus with hyperglycemia, with long-term current use of insulin  (HCC)    Acute on chronic systolic CHF- last echo 02/16/2024 shows EF of 35 to 40%.  Weight gain from 130lbs after recent hospitalization for same, to 152lbs today. At least 2+ bilateral pitting lower extremity edema.  Was on torsemide 40  BID, did not  tolerate due to onset of dizziness dose was reduced to 20 twice daily. Chest x-ray no acute abnormality.  proBNP elevated at 3027.  Troponin 26 - Cardiology consult pending, continue IV Lasix  80 mg 3 times daily.  Creatinine 0.6, potassium 4.4, sodium 136 today.  Negative output was 1.9 L overnight. - Strict input output, daily weights, daily BMP - Not on Jardiance  due to poorly controlled diabetes mellitus - Not on beta-blocker due to significantly reduced cardiac index -on losartan , spironolactone ,    NSVT:  - Will monitor and replace electrolytes  Hypertension-stable. -  losartan  50 mg daily, spironolactone  25 daily  Chronic anemia: Hemoglobin 12 on admission, down to 10.6 today.  Will monitor daily   Uncontrolled diabetes mellitus-A1c 9/23- 12. - SSI- M - Resume home Lantus  at reduced dose 20 units daily(home dose 30 units)     DVT prophylaxis: Lovenox  Code Status: FULL Family Communication: None  disposition Plan:  > 2 days Consults called: Cardiology Admission status: Inpt Tele  I certify that at the point of admission it is my clinical judgment that the patient will require inpatient hospital care spanning beyond 2 midnights from the point of admission due to high intensity of service, high risk for further deterioration and high frequency of surveillance required.    Subjective:  NSVT overnight  Objective: Vitals:   03/20/24 1509 03/20/24 2157 03/21/24 0326 03/21/24 0510  BP: 123/74 123/76 126/64   Pulse: 79 80 88   Resp:  18    Temp: 98.1 F (36.7 C) 98.1 F (36.7 C)  98.4 F (36.9 C)   TempSrc: Oral Oral Oral   SpO2: 99% 98% 98%   Weight:    63.1 kg  Height:        Intake/Output Summary (Last 24 hours) at 03/21/2024 0758 Last data filed at 03/20/2024 2000 Gross per 24 hour  Intake 1440 ml  Output --  Net 1440 ml   Filed Weights   03/19/24 1819 03/20/24 0528 03/21/24 0510  Weight: 67.7 kg 65.2 kg 63.1 kg    Examination:  General exam: Appears  calm and comfortable  Respiratory system: Bilateral decreased breath sounds at bases Cardiovascular system: S1 & S2 heard, Rate controlled Gastrointestinal system: Abdomen is nondistended, soft and nontender. Normal bowel sounds heard. Extremities: No cyanosis, clubbing, edema  Central nervous system: Alert and oriented. No focal neurological deficits. Moving extremities Skin: No rashes, lesions or ulcers Psychiatry: Judgement and insight appear normal. Mood & affect appropriate.     Data Reviewed: I have personally reviewed following labs and imaging studies  CBC: Recent Labs  Lab 03/19/24 1454 03/20/24 0421  WBC 8.8 8.1  NEUTROABS 6.2  --   HGB 12.3 10.6*  HCT 38.7 32.9*  MCV 96.8 95.1  PLT 283 234   Basic Metabolic Panel: Recent Labs  Lab 03/19/24 1454 03/20/24 0421 03/20/24 1833 03/21/24 0407  NA 138 136  --  134*  K 4.9 4.4 5.0 4.5  CL 100 100  --  97*  CO2 31 28  --  29  GLUCOSE 189* 165*  --  184*  BUN 17 17  --  15  CREATININE 0.73 0.60  --  0.69  CALCIUM 8.8* 8.5*  --  8.4*  MG  --   --  2.2  --    GFR: Estimated Creatinine Clearance: 83.8 mL/min (by C-G formula based on SCr of 0.69 mg/dL). Liver Function Tests: Recent Labs  Lab 03/19/24 1454  AST 26  ALT 38  ALKPHOS 88  BILITOT <0.2  PROT 7.1  ALBUMIN 3.6   No results for input(s): LIPASE, AMYLASE in the last 168 hours. No results for input(s): AMMONIA in the last 168 hours. Coagulation Profile: No results for input(s): INR, PROTIME in the last 168 hours. Cardiac Enzymes: No results for input(s): CKTOTAL, CKMB, CKMBINDEX, TROPONINI in the last 168 hours. BNP (last 3 results) Recent Labs    03/03/24 1545 03/07/24 0428 03/19/24 1454  PROBNP 4,538.0* 6,900.0* 3,027.0*   HbA1C: No results for input(s): HGBA1C in the last 72 hours. CBG: Recent Labs  Lab 03/20/24 0753 03/20/24 1156 03/20/24 1720 03/20/24 2156 03/21/24 0740  GLUCAP 143* 281* 276* 156* 164*   Lipid  Profile: No results for input(s): CHOL, HDL, LDLCALC, TRIG, CHOLHDL, LDLDIRECT in the last 72 hours. Thyroid Function Tests: No results for input(s): TSH, T4TOTAL, FREET4, T3FREE, THYROIDAB in the last 72 hours. Anemia Panel: No results for input(s): VITAMINB12, FOLATE, FERRITIN, TIBC, IRON, RETICCTPCT in the last 72 hours. Sepsis Labs: No results for input(s): PROCALCITON, LATICACIDVEN in the last 168 hours.  No results found for this or any previous visit (from the past 240 hours).       Radiology Studies: DG Chest 2 View Result Date: 03/19/2024 CLINICAL DATA:  Shortness of breath. Fluid retention with diffuse body swelling. Smoker. EXAM: CHEST - 2 VIEW COMPARISON:  03/03/2024 FINDINGS: Normal sized heart. Stable mild chronic interstitial lung disease with mild peribronchial thickening. Normal pulmonary vasculature. No airspace consolidation or pleural fluid. Unremarkable bones. IMPRESSION: 1. No acute abnormality. 2. Stable mild  chronic interstitial lung disease and mild chronic bronchitic changes compatible with the history of smoking. Electronically Signed   By: Elspeth Bathe M.D.   On: 03/19/2024 15:01        Scheduled Meds:  enoxaparin  (LOVENOX ) injection  40 mg Subcutaneous Q24H   furosemide   80 mg Intravenous TID   insulin  aspart  0-15 Units Subcutaneous TID WC   insulin  aspart  0-5 Units Subcutaneous QHS   insulin  glargine-yfgn  20 Units Subcutaneous QPM   losartan   50 mg Oral Daily   spironolactone   25 mg Oral Daily   Continuous Infusions:        Derryl Duval, MD [facility] 03/21/2024, 7:58 AM

## 2024-03-21 NOTE — Progress Notes (Signed)
 Mobility Specialist Progress Note:    03/21/24 1030  Mobility  Activity Ambulated with assistance  Level of Assistance Independent  Assistive Device None  Distance Ambulated (ft) 240 ft  Range of Motion/Exercises Active;All extremities  Activity Response Tolerated well  Mobility Referral Yes  Mobility visit 1 Mobility  Mobility Specialist Start Time (ACUTE ONLY) 1030  Mobility Specialist Stop Time (ACUTE ONLY) 1050  Mobility Specialist Time Calculation (min) (ACUTE ONLY) 20 min   Pt received walking in hallway, agreeable to mobility. Independently able to ambulate with no AD. Tolerated well, slightly dizzy. Left sitting EOB, all needs met.   Teruko Joswick Mobility Specialist Please contact via Special Educational Needs Teacher or  Rehab office at [phone]

## 2024-03-21 NOTE — Progress Notes (Signed)
 Rounding Note   Patient Name: Christie Anderson Date of Encounter: 03/21/2024  [facility] Cardiologist: Advanced Heart Failure   Subjective  Breathing improved. No chest pain or palpitations. Refused Lasix  this morning due to recurrent dizziness. Says she has to go home today due to issues with her son.   Scheduled Meds:  enoxaparin  (LOVENOX ) injection  40 mg Subcutaneous Q24H   furosemide   80 mg Intravenous TID   insulin  aspart  0-15 Units Subcutaneous TID WC   insulin  aspart  0-5 Units Subcutaneous QHS   insulin  glargine-yfgn  20 Units Subcutaneous QPM   losartan   50 mg Oral Daily   spironolactone   25 mg Oral Daily   Continuous Infusions:  PRN Meds: acetaminophen  **OR** acetaminophen , ondansetron  **OR** ondansetron  (ZOFRAN ) IV, polyethylene glycol   Vital Signs  Vitals:   03/20/24 1509 03/20/24 2157 03/21/24 0326 03/21/24 0510  BP: 123/74 123/76 126/64   Pulse: 79 80 88   Resp:  18    Temp: 98.1 F (36.7 C) 98.1 F (36.7 C) 98.4 F (36.9 C)   TempSrc: Oral Oral Oral   SpO2: 99% 98% 98%   Weight:    63.1 kg  Height:        Intake/Output Summary (Last 24 hours) at 03/21/2024 0950 Last data filed at 03/20/2024 2000 Gross per 24 hour  Intake 1440 ml  Output --  Net 1440 ml      03/21/2024    5:10 AM 03/20/2024    5:28 AM 03/19/2024    6:19 PM  Last 3 Weights  Weight (lbs) 139 lb 1.8 oz 143 lb 11.8 oz 149 lb 4 oz  Weight (kg) 63.1 kg 65.2 kg 67.7 kg      Telemetry  NSR, HR in 70's to 80's.  - Personally Reviewed  ECG   No new tracings.   Physical Exam  GEN: Pleasant female appearing in no acute distress.   Neck: No JVD Cardiac: RRR, no murmurs, rubs, or gallops.  Respiratory: Clear to auscultation bilaterally. GI: Soft, nontender, non-distended  MS: No pitting edema; No deformity. Neuro:  Nonfocal  Psych: Normal affect   Labs High Sensitivity Troponin:  No results for input(s): TROPONINIHS in the last 720 hours.    Chemistry Recent Labs  Lab 03/19/24 1454 03/20/24 0421 03/20/24 1833 03/21/24 0407  NA 138 136  --  134*  K 4.9 4.4 5.0 4.5  CL 100 100  --  97*  CO2 31 28  --  29  GLUCOSE 189* 165*  --  184*  BUN 17 17  --  15  CREATININE 0.73 0.60  --  0.69  CALCIUM 8.8* 8.5*  --  8.4*  MG  --   --  2.2  --   PROT 7.1  --   --   --   ALBUMIN 3.6  --   --   --   AST 26  --   --   --   ALT 38  --   --   --   ALKPHOS 88  --   --   --   BILITOT <0.2  --   --   --   GFRNONAA >60 >60  --  >60  ANIONGAP 7 8  --  8    Lipids No results for input(s): CHOL, TRIG, HDL, LABVLDL, LDLCALC, CHOLHDL in the last 168 hours.  Hematology Recent Labs  Lab 03/19/24 1454 03/20/24 0421 03/21/24 0857  WBC 8.8 8.1  --   RBC  4.00 3.46*  --   HGB 12.3 10.6* 10.6*  HCT 38.7 32.9* 32.3*  MCV 96.8 95.1  --   MCH 30.8 30.6  --   MCHC 31.8 32.2  --   RDW 12.4 12.4  --   PLT 283 234  --    Thyroid No results for input(s): TSH, FREET4 in the last 168 hours.  BNP Recent Labs  Lab 03/19/24 1454  PROBNP 3,027.0*    DDimer No results for input(s): DDIMER in the last 168 hours.   Radiology  DG Chest 2 View Result Date: 03/19/2024 CLINICAL DATA:  Shortness of breath. Fluid retention with diffuse body swelling. Smoker. EXAM: CHEST - 2 VIEW COMPARISON:  03/03/2024 FINDINGS: Normal sized heart. Stable mild chronic interstitial lung disease with mild peribronchial thickening. Normal pulmonary vasculature. No airspace consolidation or pleural fluid. Unremarkable bones. IMPRESSION: 1. No acute abnormality. 2. Stable mild chronic interstitial lung disease and mild chronic bronchitic changes compatible with the history of smoking. Electronically Signed   By: Elspeth Bathe M.D.   On: 03/19/2024 15:01    Cardiac Studies  RHC: 02/16/2024 HEMODYNAMICS: RA:                  3 mmHg (mean) RV:                  19/5 mmHg PA:                  17/10 mmHg (13 mean) PCWP:            6 mmHg (mean)                                       Estimated Fick CO/CI   3.9 L/min, 2.28 L/min/m2 Thermodilution CO/CI   2.3 L/min, 1.2 L/min/m2                                              TPG                 7  mmHg                                              PVR                 1.8-3 Wood Units  PAPi                2.5     Post biopsy RA:     IMPRESSION: Low pre and post capillary filling pressures Severely reduced cardiac index by TD; moderately reduced by TD Three RV endomyocardial samples obtained under echo guidance. No pericardial effusion or TR pre or post procedure.   Limited Echo: 02/16/2024 IMPRESSIONS     1. Left ventricular ejection fraction, by estimation, is 35 to 40%. The  left ventricle has moderately decreased function.   2. Myocardial biopsy catheter noted transiently in RV.   3. Trivial effusion noted pre procedure.   Patient Profile   48 y.o. 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   Assessment & Plan   1. Acute HFrEF - EF was reduced at 35 to 40% by most recent echo in 01/2024 and RHC in 01/2024 showed severely reduced filling pressures and severely reduced cardiac index. Endomyocardial biopsy was not consistent with cardiac amyloid. - She was admitted with an acute CHF exacerbation and has responded well with IV Lasix . While I&O's have not fully been recorded, weight has declined from 152 lbs to 139 lbs. She is adamant that she needs to go home today.  - As discussed in the initial consult note, she was previously intolerant to Torsemide 40 mg BID due to dizziness, therefore would plan for Torsemide 40mg  in AM/20mg  in PM at discharge. Previously unable to afford Entresto  and she has been continued on Losartan  50 mg daily and Spironolactone  25 mg daily. Not on a beta-blocker  due to low CI and Jardiance  was also discontinued during her prior admission given poorly controlled Type II DM. She does have previously scheduled follow-up with the [facility] on 04/01/2024.   2. HTN - BP has been well-controlled, at 123/74 - 126/64 within the past 24 hours. Continue current medical therapy with Losartan  50 mg daily and Spironolactone  25 mg daily.   3. Uncontrolled Type 2 DM - Hgb A1c was at 12.6 on most recent check. Management per the admitting team.    For questions or updates, please contact [facility] Please consult [email or web address] for contact info under   Signed, Laymon CHRISTELLA Qua, PA-C  03/21/2024, 9:50 AM

## 2024-03-22 ENCOUNTER — Telehealth: Payer: Self-pay | Admitting: *Deleted

## 2024-03-22 NOTE — Transitions of Care (Post Inpatient/ED Visit) (Signed)
   03/22/2024  Name: MAUD RUBENDALL MRN: [id number] DOB: 02-15-1976  Today's TOC FU Call Status: Today's TOC FU Call Status:: Unsuccessful Call (1st Attempt) Unsuccessful Call (1st Attempt) Date: 03/22/24  Attempted to reach the patient regarding the most recent Inpatient/ED visit.  Follow Up Plan: Additional outreach attempts will be made to reach the patient to complete the Transitions of Care (Post Inpatient/ED visit) call.   Mliss Creed [facility], BSN RN Care Manager/ Transition of Care [place]/ [facility] [phone]

## 2024-03-25 ENCOUNTER — Telehealth: Payer: Self-pay

## 2024-03-25 NOTE — Transitions of Care (Post Inpatient/ED Visit) (Signed)
   03/25/2024  Name: Christie Anderson MRN: [id number] DOB: [birth year]  Today's TOC FU Call Status: Today's TOC FU Call Status:: Unsuccessful Call (2nd Attempt) Unsuccessful Call (2nd Attempt) Date: 03/25/24  Attempted to reach the patient regarding the most recent Inpatient/ED visit.  Follow Up Plan: Additional outreach attempts will be made to reach the patient to complete the Transitions of Care (Post Inpatient/ED visit) call.   Alan Ee, RN, BSN, CEN [employer]- Transition of Care Team.  [facility] [phone]

## 2024-03-26 ENCOUNTER — Other Ambulatory Visit (HOSPITAL_BASED_OUTPATIENT_CLINIC_OR_DEPARTMENT_OTHER): Payer: Self-pay

## 2024-03-27 ENCOUNTER — Other Ambulatory Visit: Payer: Self-pay | Admitting: Physician Assistant

## 2024-03-27 ENCOUNTER — Other Ambulatory Visit (HOSPITAL_COMMUNITY): Payer: Self-pay

## 2024-03-27 ENCOUNTER — Telehealth: Payer: Self-pay | Admitting: Physician Assistant

## 2024-03-27 ENCOUNTER — Other Ambulatory Visit (HOSPITAL_BASED_OUTPATIENT_CLINIC_OR_DEPARTMENT_OTHER): Payer: Self-pay

## 2024-03-27 MED ORDER — FREESTYLE LIBRE 3 PLUS SENSOR MISC
1.0000 | 3 refills | Status: DC
  Filled 2024-03-27: qty 2, 30d supply, fill #0

## 2024-03-27 MED ORDER — FREESTYLE LIBRE 3 SENSOR MISC: 1.0000 | 3 refills | Status: DC

## 2024-03-27 MED ORDER — TORSEMIDE 20 MG PO TABS
40.0000 mg | ORAL_TABLET | Freq: Two times a day (BID) | ORAL | 0 refills | Status: DC
  Filled 2024-03-27: qty 120, 30d supply, fill #0

## 2024-03-27 MED ORDER — POTASSIUM CHLORIDE CRYS ER 20 MEQ PO TBCR
10.0000 meq | EXTENDED_RELEASE_TABLET | Freq: Two times a day (BID) | ORAL | 0 refills | Status: DC
  Filled 2024-03-27: qty 30, 60d supply, fill #0
  Filled 2024-03-28: qty 30, 30d supply, fill #0

## 2024-03-27 NOTE — Telephone Encounter (Signed)
 Copied from CRM [id number]. Topic: Clinical - Medication Question >> Mar 27, 2024 11:31 AM Avram MATSU wrote: Reason for CRM: p[patient is wondering if she can get libre freestyle sent in or samples she can use. Please sent rx to [facility] please advise [phone]

## 2024-03-28 ENCOUNTER — Other Ambulatory Visit (HOSPITAL_BASED_OUTPATIENT_CLINIC_OR_DEPARTMENT_OTHER): Payer: Self-pay

## 2024-03-28 ENCOUNTER — Other Ambulatory Visit: Payer: Self-pay | Admitting: Physician Assistant

## 2024-03-28 ENCOUNTER — Telehealth: Payer: Self-pay

## 2024-03-28 NOTE — Transitions of Care (Post Inpatient/ED Visit) (Signed)
   03/28/2024  Name: Christie Anderson MRN: [id number] DOB: [birth year]  Today's TOC FU Call Status: Today's TOC FU Call Status:: Unsuccessful Call (3rd Attempt) Unsuccessful Call (3rd Attempt) Date: 03/28/24  Attempted to reach the patient regarding the most recent Inpatient/ED visit.  Follow Up Plan: No further outreach attempts will be made at this time. We have been unable to contact the patient.  Alan Ee, RN, BSN, CEN [employer]- Transition of Care Team.  [facility] [phone]

## 2024-04-01 ENCOUNTER — Inpatient Hospital Stay (HOSPITAL_BASED_OUTPATIENT_CLINIC_OR_DEPARTMENT_OTHER)
Admit: 2024-04-01 | Discharge: 2024-04-01 | Disposition: A | Discharge status: Home / Self Care | Payer: MEDICAID | Attending: Cardiology | Admitting: Cardiology

## 2024-04-01 ENCOUNTER — Encounter (HOSPITAL_COMMUNITY): Payer: Self-pay

## 2024-04-01 VITALS — BP 124/70 | HR 86 | Ht 69.0 in | Wt 147.2 lb

## 2024-04-01 DIAGNOSIS — I5042 Chronic combined systolic (congestive) and diastolic (congestive) heart failure: Secondary | ICD-10-CM

## 2024-04-01 MED ORDER — POTASSIUM CHLORIDE CRYS ER 20 MEQ PO TBCR: 10.0000 meq | EXTENDED_RELEASE_TABLET | Freq: Two times a day (BID) | ORAL | 1 refills | Status: AC

## 2024-04-01 NOTE — Patient Instructions (Addendum)
 Good to see you today!  Labs done today, your results will be available in MyChart, we will contact you for abnormal readings.  Your physician recommends that you schedule a follow-up appointment Call office in December for January appointment  If you have any questions or concerns before your next appointment please send us  a message through [place] or call our office at [phone].    TO LEAVE A MESSAGE FOR THE NURSE SELECT OPTION 2, PLEASE LEAVE A MESSAGE INCLUDING: YOUR NAME DATE OF BIRTH CALL BACK NUMBER REASON FOR CALL**this is important as we prioritize the call backs  YOU WILL RECEIVE A CALL BACK THE SAME DAY AS LONG AS YOU CALL BEFORE 4:00 PM At the [facility], you and your health needs are our priority. As part of our continuing mission to provide you with exceptional heart care, we have created designated Provider Care Teams. These Care Teams include your primary Cardiologist (physician) and Advanced Practice Providers (APPs- Physician Assistants and Nurse Practitioners) who all work together to provide you with the care you need, when you need it.   You may see any of the following providers on your designated Care Team at your next follow up: Dr Toribio Fuel Dr Ezra Shuck Dr. Morene Brownie Greig Mosses, NP Caffie Shed, [place] [facility] [place], [place] Beckey Coe, NP Jordan Lee, NP Ellouise Class, NP Tinnie Redman, PharmD Jaun Bash, PharmD   Please be sure to bring in all your medications bottles to every appointment.    Thank you for choosing [facility]

## 2024-04-01 NOTE — Addendum Note (Signed)
 Encounter addended by: Cathern Andriette DEL, LCSW on: 04/01/2024 2:43 PM  Actions taken: Clinical Note Signed

## 2024-04-01 NOTE — Addendum Note (Signed)
 Encounter addended by: Malai Lady M, RN on: 04/01/2024 2:27 PM  Actions taken: Order list changed, Diagnosis association updated

## 2024-04-01 NOTE — Progress Notes (Signed)
 H&V Care Navigation CSW Progress Note  Clinical Social Worker consulted to speak with pt regarding insurance.  Reviewed account notes and [employer] has been attempted contact to screen for Medicaid.  Able to connect patient and [employer] while in clinic and get authorized rep forms completed and sent back to [employer].  They think she is over income for expansion Medicaid but might could qualify for a program if she is approved for disability so they will assist her in applying.  CSW provided [employer] app to help with current and past cone bills while she awaits determination from disability.  Will continue to follow in clinic and assist as needed  Mansoor Hillyard H. Aliscia Clayton, LCSW Clinical Social Worker [facility] Desk#: [phone] Cell#: [phone]

## 2024-04-01 NOTE — Addendum Note (Signed)
 Encounter addended by: Delorese Sellin M, RN on: 04/01/2024 2:23 PM  Actions taken: Charge Capture section accepted

## 2024-04-01 NOTE — Progress Notes (Signed)
 ADVANCED HF CLINIC NOTE  Primary Care: [facility], [place], [place] Primary Cardiologist: Dr. Alvan HF Cardiologist: Dr. Zenaida  HPI: Christie Anderson is a 48 y.o. female with HFrEF and tobacco abuse.   Admitted 9/25 with acute HFrEF with marked volume overload, weight up ~ 50lbs over 6 months. Echo showed LVEF 35-40% RV mildly reduced. Marked LVH with low volts on EKG. Diuresed with Lasix  gtt and Diamox , overall down 60 lbs. Underwent cMRI showing LVEF 32%,  RVEF 39%, nonspecific pattern of LGE with midwall septal scar, seen in NiCM. No definite findings of cardiac amyloidosis. Underwent RHC + endomyocardial biopsy showing severely reduced filling pressures, and severely reduced cardiac index. Biopsy results showed benign myocardium showing mild nonspecific cardial myocyte hyperplasia. Negative for granulomas, inflammation or any other infiltrative process. GDMT titrated and discharged home, weight 120 lbs.  Re-admitted 10/25 with a/c HF. She was diuresed and discharged home, weight 130 lbs.  Seen for hospital f/u 10/25 and was volume overloaded. Wt was up 8 lb and ReDs was elevated at 40%. She was given  Furoscix  daily + 40 KCL daily x 3 days,  instructed to hold torsemide while using Furoscix . After 3 days, instructed to return to torsemide 20 mg bid + 20 KCL daily.  Unfortunately, she says she was not able to tolerate Furoscix  d/t n/v. She continued w/ volume overload and ended up getting readmitted for a/c CHF. She was diuresed w/ IV Lasix  and placed back on torsemide at 40 mg qam and 20 mg qpm.   She presents back today for f/u. She reports feeling better. Breathing has improved as has her LEE. She says her wt has been stable and has not increased since her last hospitalization. She reports full med compliance and tolerating meds better w/o any current n/v. No further dizziness. ReDs is improved today, down to 34%. BP well controlled, 124/70.      Cardiac Studies - Ltd echo (02/16/24): EF  35-40% - RHC (9/25): RA 3, PA 17/10 (13), PCWP 6, CO/CI (Fick) 2.28/1.2, PVR 1.8-3 WU, PAPi 2.5 - cMRI (9/25): LVEF 32%, RVEF 39%, nonspecific ECV elevation (32%) - Echo (02/10/24): EF 35-40%, moderate LVH, G1DD, RV mildly reduced,   Past Medical History:  Diagnosis Date   CHF (congestive heart failure) (HCC)    Diabetes (HCC)    Hypertension    Current Outpatient Medications  Medication Sig Dispense Refill   Continuous Glucose Sensor (FREESTYLE LIBRE 3 PLUS SENSOR) MISC Place 1 sensor on the skin every 15 days. Use to check glucose continuously. 2 each 3   Insulin  Glargine (BASAGLAR  KWIKPEN) 100 UNIT/ML Inject 30 Units into the skin every evening. May substitute as needed per insurance. 27 mL 3   insulin  lispro (HUMALOG ) 100 UNIT/ML KwikPen Inject 3 Units into the skin 3 (three) times daily. Pt is taking 4-16 units based on blood sugar (sliding scale) pt does not have scale in front of her to read scale     Insulin  Pen Needle 32G X 4 MM MISC Use 3 (three) times daily. 100 each 0   Insulin  Syringe-Needle U-100 (INSULIN  SYRINGE .5CC/30GX5/16) 30G X 5/16 0.5 ML MISC Use one syringe and needle per insulin  injection. 100 each 1   Lancet Device MISC 1 each by Does not apply route 3 (three) times daily. May dispense any manufacturer covered by patient's insurance. 1 each 0   losartan  (COZAAR ) 50 MG tablet Take 1 tablet (50 mg total) by mouth daily. 30 tablet 2   spironolactone  (ALDACTONE )  25 MG tablet Take 1 tablet (25 mg total) by mouth daily. 30 tablet 2   torsemide (DEMADEX) 20 MG tablet Take 2 tablets in the morning and 1 tablet in the evening.     Accu-Chek Softclix Lancets lancets Use 3 (three) times daily as directed to check blood sugar. (Patient not taking: Reported on 04/01/2024) 100 each 0   Blood Glucose Monitoring Suppl (BLOOD GLUCOSE MONITOR SYSTEM) w/Device KIT Use 3 (three) times daily. (Patient not taking: Reported on 04/01/2024) 1 kit 0   Glucose Blood (BLOOD GLUCOSE TEST STRIPS)  STRP Use 3 (three) times daily as directed to check blood sugar. (Patient not taking: Reported on 04/01/2024) 100 strip 0   potassium chloride  SA (KLOR-CON  M) 20 MEQ tablet Take 0.5 tablets (10 mEq total) by mouth in the morning and at bedtime. 90 tablet 1   torsemide (DEMADEX) 20 MG tablet Take 2 tablets (40 mg total) by mouth 2 (two) times daily. (Patient not taking: Reported on 04/01/2024) 180 tablet 0   No current facility-administered medications for this encounter.   Allergies  Allergen Reactions   Bovine (Beef) Protein-Containing Drug Products Hives and Swelling   Porcine (Pork) Protein-Containing Drug Products Hives and Swelling   Amoxicillin Other (See Comments)    Unknown    Doxycycline Nausea And Vomiting   Penicillins Other (See Comments)    Unknown    Social History   Socioeconomic History   Marital status: Single    Spouse name: Not on file   Number of children: 1   Years of education: Not on file   Highest education level: GED or equivalent  Occupational History   Occupation: Unemployed  Tobacco Use   Smoking status: Former    Types: Cigarettes    Passive exposure: Current   Smokeless tobacco: Current  Vaping Use   Vaping status: Every Day  Substance and Sexual Activity   Alcohol use: Not Currently   Drug use: Never   Sexual activity: Not on file  Other Topics Concern   Not on file  Social History Narrative   Not on file   Social Drivers of Health   Financial Resource Strain: Medium Risk (02/26/2024)   Overall Financial Resource Strain (CARDIA)    Difficulty of Paying Living Expenses: Somewhat hard  Food Insecurity: Food Insecurity Present (03/19/2024)   Hunger Vital Sign    Worried About Running Out of Food in the Last Year: Sometimes true    Ran Out of Food in the Last Year: Sometimes true  Transportation Needs: No Transportation Needs (03/19/2024)   PRAPARE - Administrator, Civil Service (Medical): No    Lack of Transportation  (Non-Medical): No  Physical Activity: Not on file  Stress: Not on file  Social Connections: Not on file  Intimate Partner Violence: Not At Risk (03/19/2024)   Humiliation, Afraid, Rape, and Kick questionnaire    Fear of Current or Ex-Partner: No    Emotionally Abused: No    Physically Abused: No    Sexually Abused: No   Family History  Problem Relation Age of Onset   Hypertension Mother    Stroke Mother    Stroke Paternal Grandmother    Wt Readings from Last 3 Encounters:  04/01/24 66.8 kg (147 lb 3.2 oz)  03/21/24 63.1 kg (139 lb 1.8 oz)  03/12/24 63 kg (138 lb 12.8 oz)   BP 124/70   Pulse 86   Ht 5' 9 (1.753 m)   Wt 66.8 kg (147 lb  3.2 oz)   LMP 12/01/2011   SpO2 99%   BMI 21.74 kg/m   PHYSICAL EXAM: GENERAL: chronically ill appearing, NAD. Looks older than actual age  Lungs- clear CARDIAC:  JVP not elevated          Normal rate with regular rhythm. No MRG. Trace-1+ b/l LEE  ABDOMEN: Soft, non-tender, non-distended.  EXTREMITIES: Warm and well perfused.  NEUROLOGIC: No obvious FND  ReDs reading: 34%, normal   ECG: not performed    ASSESSMENT & PLAN: Chronic HFrEF: - Echo 9/25: EF 35-40% RV mildly reduced. Marked LVH with low volts on EKG.  - ? Infiltrative disease. No previous cardiac diagnosis. Mom died from HF.  - EKG low voltage with poor anterior R wave progression consistent with underlying amyloid.  - RHC 9/25: severely reduced filling pressures and severely reduced cardiac index - MMP no M spike observed. K/L ratio elevated at 1.85. Biopsy not consistent with cardiac amyloid. - CMRI 9/25:  LVEF 32%, RVEF 39%, nonspecific pattern of delayed enhancement with midwall septal scar, seen in NiCM. No definite findings of cardiac amyloidosis.  - NYHA II, improved  - Volume improved on exam. Wt stable since recent hospitalization, ReDs improved from 41>>down to 34%.  - Continue Torsemide 40 mg qam/20 mg qpm  - Continue spiro 25 mg daily  - Continue losartan  50  mg daily. (Uninsured so no Entresto ) - Off SGLTI2 with A1c >10. - w/ issues w/ positional dizziness, will hold of on adding ? blocker at this time  - Will need to consider advanced therapies in the near future but will also need better glycemic control first, (Hgb A1c 14)  - Will need repeat echo ~ 3 months  - encouraged to wear TED hoses  - Check BMP today    Uncontrolled DM  - Hgb A1C 14.  - No SGLT2i with A1c 14. - Management per PCP.   Tobacco Abuse - Stopped cigarettes, now vapes - Cessation advised.   SDOH - uninsured, Medicaid-pending - Engage HFSW to help with resources - Cardiac meds thru HF fund until insurance approved  F/u w/ Dr. Zenaida in 4-6 wk. Advised to call back sooner if she develops and symptoms or wt gain, worsening edema.    Caffie Shed, PA-C  04/01/24

## 2024-04-02 ENCOUNTER — Telehealth (HOSPITAL_COMMUNITY): Payer: Self-pay | Admitting: Cardiology

## 2024-04-02 NOTE — Telephone Encounter (Signed)
 Patient called to question which OTC pain reliever would be ok to take.  Reports she wanted to be sure she doesn't cause damage to her heart   Ok for tylenol 

## 2024-04-03 ENCOUNTER — Telehealth: Payer: Self-pay

## 2024-04-03 NOTE — Telephone Encounter (Signed)
 Copied from CRM [id number]. Topic: Clinical - Prescription Issue >> Apr 03, 2024  3:30 PM Charlet HERO wrote: Reason for CRM: [facility] is calling about the libre freestyle was chngd to the [place] [facility] plus bc the other is no longer available.

## 2024-04-04 ENCOUNTER — Other Ambulatory Visit: Payer: Self-pay

## 2024-04-04 MED ORDER — FREESTYLE LIBRE 3 PLUS SENSOR MISC: 1.0000 | 3 refills | Status: AC

## 2024-04-11 ENCOUNTER — Telehealth: Payer: Self-pay

## 2024-04-11 NOTE — Telephone Encounter (Signed)
 Received referral from [place] at [facility] on 10/16. Patient was re-admitted to the hospital 10/28. I attempted to reach out to the patient today, no answer, left the patient a voicemail about our services

## 2024-04-22 ENCOUNTER — Encounter: Payer: Self-pay | Admitting: Physician Assistant

## 2024-04-22 ENCOUNTER — Ambulatory Visit: Payer: Self-pay | Admitting: Physician Assistant

## 2024-04-22 ENCOUNTER — Other Ambulatory Visit (HOSPITAL_BASED_OUTPATIENT_CLINIC_OR_DEPARTMENT_OTHER): Payer: Self-pay

## 2024-04-22 VITALS — BP 132/82 | HR 96 | Temp 98.8°F | Ht 69.0 in | Wt 137.5 lb

## 2024-04-22 DIAGNOSIS — I5020 Unspecified systolic (congestive) heart failure: Secondary | ICD-10-CM

## 2024-04-22 DIAGNOSIS — E1165 Type 2 diabetes mellitus with hyperglycemia: Secondary | ICD-10-CM

## 2024-04-22 DIAGNOSIS — Z59868 Other specified financial insecurity: Secondary | ICD-10-CM | POA: Insufficient documentation

## 2024-04-22 DIAGNOSIS — Z794 Long term (current) use of insulin: Secondary | ICD-10-CM

## 2024-04-22 MED ORDER — EPLERENONE 50 MG PO TABS
50.0000 mg | ORAL_TABLET | Freq: Every day | ORAL | 1 refills | Status: DC
  Filled 2024-04-22: qty 30, 30d supply, fill #0

## 2024-04-22 NOTE — Assessment & Plan Note (Signed)
 Recent hospitalization for fluid overload. She wish for spironolactone  discontinued due to itching. Eplerenone ordered as an alternative. No significant edema or dyspnea reported today. - Start Eplerenone daily in place of spironolactone .  - Instructed to contact CHF clinic for appointment with Dr. Zenaida before Christmas. - Advised Benadryl with spironolactone  if eplerenone is too expensive. - Follow up sooner for weight gain, increased lower extremity edema, or shortness of breath.

## 2024-04-22 NOTE — Progress Notes (Signed)
 Established Patient Office Visit  Subjective   Patient ID: Christie Anderson, female    DOB: 24-Jan-1976  Age: 48 y.o. MRN: [id number]  Chief Complaint  Patient presents with   Follow-up    Patient is here for a hospital follow up.  She was retaining fluid. They took off 50 lbs of fluid around the knee at the hospital.  She is a diabetic and wanting to know a company she could go too without having insurance    Discussed the use of AI scribe software for clinical note transcription with the patient, who gave verbal consent to proceed.  History of Present Illness Christie Anderson is a 48 year old female with diabetes and heart failure who presents for follow-up after recent hospitalization for fluid overload.  She uses long-acting insulin  30 units at bedtime and short-acting insulin  7 to 10 units before meals based on blood sugars. Her sugars average around 300 mg/dL with occasional nocturnal hypoglycemia, including a drop to 43 mg/dL about two weeks ago. Nighttime values are now closer to 150 mg/dL. She is worried about affording her diabetes medications and wants help lowering costs.  She was hospitalized at the end of October for fluid overload and had 25 pounds of fluid removed. Her baseline weight is about 135 pounds and today she is 137 pounds. She weighs herself daily in the morning for heart failure monitoring and follows regularly with the heart failure clinic.   She reports severe side effects from spironolactone , including intense itching, restlessness, and inability to sleep, described as feeling like she wants to come out of my skin. She also takes Lasix  for fluid management.  She is at risk of losing her housing because her son is selling her home and she will need a new place to live. She currently has no insurance and needs assistance with both housing and medication costs.   Review of Systems  Constitutional:  Negative for chills, fever and malaise/fatigue.  Eyes:  Negative  for double vision.  Respiratory:  Negative for cough and shortness of breath.   Cardiovascular:  Negative for chest pain, palpitations and leg swelling.      Objective:     BP 132/82 (BP Location: Left Arm, Patient Position: Sitting)   Pulse 96   Temp 98.8 F (37.1 C)   Ht 5' 9 (1.753 m)   Wt 137 lb 8 oz (62.4 kg)   LMP 12/01/2011   SpO2 99%   BMI 20.31 kg/m    Physical Exam Constitutional:      General: She is not in acute distress.    Appearance: Normal appearance. She is ill-appearing.  HENT:     Head: Normocephalic and atraumatic.     Mouth/Throat:     Mouth: Mucous membranes are moist.     Pharynx: Oropharynx is clear.  Eyes:     Extraocular Movements: Extraocular movements intact.     Conjunctiva/sclera: Conjunctivae normal.  Cardiovascular:     Rate and Rhythm: Normal rate and regular rhythm.     Heart sounds: Normal heart sounds. No murmur heard. Pulmonary:     Effort: Pulmonary effort is normal.     Breath sounds: Normal breath sounds. No wheezing, rhonchi or rales.  Musculoskeletal:     Right lower leg: No edema.     Left lower leg: No edema.  Skin:    General: Skin is warm and dry.  Neurological:     General: No focal deficit present.  Mental Status: She is alert and oriented to person, place, and time.  Psychiatric:        Mood and Affect: Mood normal.        Behavior: Behavior normal.      No results found for any visits on 04/22/24.  The ASCVD Risk score (Arnett DK, et al., 2019) failed to calculate for the following reasons:   The valid total cholesterol range is 130 to 320 mg/dL    Assessment & Plan:   Return in about 3 months (around 07/21/2024) for DM.   HFrEF (heart failure with reduced ejection fraction) (HCC) Assessment & Plan: Recent hospitalization for fluid overload. She wish for spironolactone  discontinued due to itching. Eplerenone ordered as an alternative. No significant edema or dyspnea reported today. - Start  Eplerenone daily in place of spironolactone .  - Instructed to contact CHF clinic for appointment with Dr. Zenaida before Christmas. - Advised Benadryl with spironolactone  if eplerenone is too expensive. - Follow up sooner for weight gain, increased lower extremity edema, or shortness of breath.   Orders: -     Eplerenone; Take 1 tablet (50 mg total) by mouth daily.  Dispense: 30 tablet; Refill: 1 -     AMB Referral [facility] Care Management  Type 2 diabetes mellitus with hyperglycemia, with long-term current use of insulin  (HCC) Assessment & Plan: Blood sugars elevated, averaging 300 mg/dL, with occasional nocturnal hypoglycemia. Current insulin  regimen includes Basaglar  and Insulin  lispro. Last A1c done during hospitalization in September. - Referred to pharmacy team for insulin  dosing adjustment and cost assistance. - Continue Basaglar  30 units at bedtime, Insulin  lispro 7-10 units before meals. - Repeat A1c test after December 23rd.  Orders: -     AMB Referral [facility] Care Management  Financial insecurity due to medical expenses Assessment & Plan: Financial constraints affecting medication access and housing. - Referred to social worker for housing, finances, and insurance assistance. - Coordinated with pharmacy team for medication cost assistance.   Orders: -     AMB Referral [facility] Care Management    Charmaine Shelton Soler, PA-C

## 2024-04-22 NOTE — Assessment & Plan Note (Signed)
 Blood sugars elevated, averaging 300 mg/dL, with occasional nocturnal hypoglycemia. Current insulin  regimen includes Basaglar  and Insulin  lispro. Last A1c done during hospitalization in September. - Referred to pharmacy team for insulin  dosing adjustment and cost assistance. - Continue Basaglar  30 units at bedtime, Insulin  lispro 7-10 units before meals. - Repeat A1c test after December 23rd.

## 2024-04-22 NOTE — Assessment & Plan Note (Signed)
 Financial constraints affecting medication access and housing. - Referred to child psychotherapist for housing, finances, and insurance assistance. - Coordinated with pharmacy team for medication cost assistance.

## 2024-04-22 NOTE — Patient Instructions (Signed)
 Call CHF clinic to set up appointment with Dr. Zenaida before Christmas

## 2024-04-24 ENCOUNTER — Other Ambulatory Visit (HOSPITAL_BASED_OUTPATIENT_CLINIC_OR_DEPARTMENT_OTHER): Payer: Self-pay

## 2024-04-30 ENCOUNTER — Telehealth: Payer: Self-pay

## 2024-04-30 NOTE — Progress Notes (Signed)
 Complex Care Management Note Care Guide Note  04/30/2024 Name: Christie Anderson MRN: [id number] DOB: Dec 28, 1975   Complex Care Management Outreach Attempts: An unsuccessful telephone outreach was attempted today to offer the patient information about available complex care management services.  Follow Up Plan:  Additional outreach attempts will be made to offer the patient complex care management information and services.   Encounter Outcome:  No Answer  Jeoffrey Buffalo , RMA     [facility], [facility] Care Guide  Direct Dial : [phone]  Website: [email or web address]

## 2024-05-01 ENCOUNTER — Other Ambulatory Visit: Payer: Self-pay

## 2024-05-01 ENCOUNTER — Encounter (HOSPITAL_COMMUNITY): Payer: Self-pay

## 2024-05-01 ENCOUNTER — Emergency Department (HOSPITAL_COMMUNITY): Payer: MEDICAID

## 2024-05-01 ENCOUNTER — Emergency Department (HOSPITAL_COMMUNITY)
Admission: EM | Admit: 2024-05-01 | Discharge: 2024-05-01 | Discharge status: Against Medical Advice | Payer: MEDICAID | Attending: Emergency Medicine | Admitting: Emergency Medicine

## 2024-05-01 ENCOUNTER — Encounter: Payer: Self-pay | Admitting: Physician Assistant

## 2024-05-01 DIAGNOSIS — M79671 Pain in right foot: Secondary | ICD-10-CM | POA: Insufficient documentation

## 2024-05-01 DIAGNOSIS — Z5321 Procedure and treatment not carried out due to patient leaving prior to being seen by health care provider: Secondary | ICD-10-CM | POA: Insufficient documentation

## 2024-05-01 DIAGNOSIS — R531 Weakness: Secondary | ICD-10-CM | POA: Insufficient documentation

## 2024-05-01 DIAGNOSIS — R5383 Other fatigue: Secondary | ICD-10-CM | POA: Insufficient documentation

## 2024-05-01 LAB — CBC WITH DIFFERENTIAL/PLATELET
Abs Immature Granulocytes: 0.04 K/uL (ref 0.00–0.07)
Basophils Absolute: 0.1 K/uL (ref 0.0–0.1)
Basophils Relative: 1 %
Eosinophils Absolute: 0.2 K/uL (ref 0.0–0.5)
Eosinophils Relative: 2 %
HCT: 33.9 % — ABNORMAL LOW (ref 36.0–46.0)
Hemoglobin: 11.4 g/dL — ABNORMAL LOW (ref 12.0–15.0)
Immature Granulocytes: 0 %
Lymphocytes Relative: 19 %
Lymphs Abs: 2.1 K/uL (ref 0.7–4.0)
MCH: 30.3 pg (ref 26.0–34.0)
MCHC: 33.6 g/dL (ref 30.0–36.0)
MCV: 90.2 fL (ref 80.0–100.0)
Monocytes Absolute: 0.8 K/uL (ref 0.1–1.0)
Monocytes Relative: 7 %
Neutro Abs: 7.5 K/uL (ref 1.7–7.7)
Neutrophils Relative %: 71 %
Platelets: 382 K/uL (ref 150–400)
RBC: 3.76 MIL/uL — ABNORMAL LOW (ref 3.87–5.11)
RDW: 11.9 % (ref 11.5–15.5)
WBC: 10.7 K/uL — ABNORMAL HIGH (ref 4.0–10.5)
nRBC: 0 % (ref 0.0–0.2)

## 2024-05-01 LAB — COMPREHENSIVE METABOLIC PANEL WITH GFR
ALT: 14 U/L (ref 0–44)
AST: 15 U/L (ref 15–41)
Albumin: 3.3 g/dL — ABNORMAL LOW (ref 3.5–5.0)
Alkaline Phosphatase: 81 U/L (ref 38–126)
Anion gap: 12 (ref 5–15)
BUN: 19 mg/dL (ref 6–20)
CO2: 25 mmol/L (ref 22–32)
Calcium: 9 mg/dL (ref 8.9–10.3)
Chloride: 93 mmol/L — ABNORMAL LOW (ref 98–111)
Creatinine, Ser: 0.69 mg/dL (ref 0.44–1.00)
GFR, Estimated: >60 mL/min (ref >60)
Glucose, Bld: 359 mg/dL — ABNORMAL HIGH (ref 70–99)
Potassium: 4 mmol/L (ref 3.5–5.1)
Sodium: 130 mmol/L — ABNORMAL LOW (ref 135–145)
Total Bilirubin: 0.2 mg/dL (ref 0.0–1.2)
Total Protein: 7 g/dL (ref 6.5–8.1)

## 2024-05-01 LAB — TROPONIN T, HIGH SENSITIVITY: Troponin T High Sensitivity: 25 ng/L — ABNORMAL HIGH (ref 0–19)

## 2024-05-01 LAB — PRO BRAIN NATRIURETIC PEPTIDE: Pro Brain Natriuretic Peptide: 4991 pg/mL — ABNORMAL HIGH (ref <300.0)

## 2024-05-01 NOTE — ED Notes (Signed)
 Secritary called informed staff that patient has left LWBS.

## 2024-05-01 NOTE — ED Triage Notes (Signed)
 Pt arrived via POV c/o multiple complaints. Pt reports increased fatigue, weakness, decreased appetite, and reports right foot pain and swelling and discoloration that began this past weekend.

## 2024-05-02 ENCOUNTER — Encounter (HOSPITAL_COMMUNITY): Payer: Self-pay

## 2024-05-02 ENCOUNTER — Emergency Department (HOSPITAL_COMMUNITY): Payer: Self-pay

## 2024-05-02 ENCOUNTER — Other Ambulatory Visit: Payer: Self-pay

## 2024-05-02 ENCOUNTER — Emergency Department (HOSPITAL_COMMUNITY)
Admission: EM | Admit: 2024-05-02 | Discharge: 2024-05-02 | Disposition: A | Discharge status: Home / Self Care | Payer: Self-pay | Attending: Emergency Medicine | Admitting: Emergency Medicine

## 2024-05-02 DIAGNOSIS — S92901A Unspecified fracture of right foot, initial encounter for closed fracture: Secondary | ICD-10-CM

## 2024-05-02 LAB — COMPREHENSIVE METABOLIC PANEL WITH GFR
ALT: 14 U/L (ref 0–44)
AST: 18 U/L (ref 15–41)
Albumin: 3.6 g/dL (ref 3.5–5.0)
Alkaline Phosphatase: 94 U/L (ref 38–126)
Anion gap: 13 (ref 5–15)
BUN: 18 mg/dL (ref 6–20)
CO2: 25 mmol/L (ref 22–32)
Calcium: 9.3 mg/dL (ref 8.9–10.3)
Chloride: 93 mmol/L — ABNORMAL LOW (ref 98–111)
Creatinine, Ser: 0.63 mg/dL (ref 0.44–1.00)
GFR, Estimated: >60 mL/min (ref >60)
Glucose, Bld: 187 mg/dL — ABNORMAL HIGH (ref 70–99)
Potassium: 4.5 mmol/L (ref 3.5–5.1)
Sodium: 132 mmol/L — ABNORMAL LOW (ref 135–145)
Total Bilirubin: 0.2 mg/dL (ref 0.0–1.2)
Total Protein: 7.9 g/dL (ref 6.5–8.1)

## 2024-05-02 LAB — CBC WITH DIFFERENTIAL/PLATELET
Abs Immature Granulocytes: 0.05 K/uL (ref 0.00–0.07)
Basophils Absolute: 0.1 K/uL (ref 0.0–0.1)
Basophils Relative: 0 %
Eosinophils Absolute: 0.2 K/uL (ref 0.0–0.5)
Eosinophils Relative: 2 %
HCT: 37.5 % (ref 36.0–46.0)
Hemoglobin: 12.7 g/dL (ref 12.0–15.0)
Immature Granulocytes: 0 %
Lymphocytes Relative: 20 %
Lymphs Abs: 2.5 K/uL (ref 0.7–4.0)
MCH: 30.4 pg (ref 26.0–34.0)
MCHC: 33.9 g/dL (ref 30.0–36.0)
MCV: 89.7 fL (ref 80.0–100.0)
Monocytes Absolute: 0.9 K/uL (ref 0.1–1.0)
Monocytes Relative: 7 %
Neutro Abs: 9 K/uL — ABNORMAL HIGH (ref 1.7–7.7)
Neutrophils Relative %: 71 %
Platelets: 474 K/uL — ABNORMAL HIGH (ref 150–400)
RBC: 4.18 MIL/uL (ref 3.87–5.11)
RDW: 11.9 % (ref 11.5–15.5)
WBC: 12.7 K/uL — ABNORMAL HIGH (ref 4.0–10.5)
nRBC: 0 % (ref 0.0–0.2)

## 2024-05-02 LAB — URINALYSIS, ROUTINE W REFLEX MICROSCOPIC
Bacteria, UA: NONE SEEN
Bilirubin Urine: NEGATIVE
Glucose, UA: NEGATIVE mg/dL
Ketones, ur: NEGATIVE mg/dL
Leukocytes,Ua: NEGATIVE
Nitrite: NEGATIVE
Protein, ur: >=300 mg/dL — AB
Specific Gravity, Urine: 1.012 (ref 1.005–1.030)
pH: 6 (ref 5.0–8.0)

## 2024-05-02 LAB — TROPONIN T, HIGH SENSITIVITY: Troponin T High Sensitivity: 28 ng/L — ABNORMAL HIGH (ref 0–19)

## 2024-05-02 LAB — MAGNESIUM: Magnesium: 2 mg/dL (ref 1.7–2.4)

## 2024-05-02 LAB — PRO BRAIN NATRIURETIC PEPTIDE: Pro Brain Natriuretic Peptide: 5005 pg/mL — ABNORMAL HIGH (ref <300.0)

## 2024-05-02 LAB — CBG MONITORING, ED: Glucose-Capillary: 187 mg/dL — ABNORMAL HIGH (ref 70–99)

## 2024-05-02 NOTE — Discharge Instructions (Addendum)
 Please call Dr. Onesimo office first thing in the morning for appointment to be seen tomorrow for the breaks to your foot.  Please leave the splint in place and do not get it wet.  Please follow-up closely with your primary care doctor for continued evaluation of your generalized fatigue.  Your BNP is elevated today but is still consistent with your baseline.  You had no other signs of acute congestive heart failure at this point.  Return to the emergency department immediately for any new or worsening symptoms.

## 2024-05-02 NOTE — ED Provider Notes (Cosign Needed Addendum)
 [place] EMERGENCY DEPARTMENT AT [facility] Provider Note   CSN: [id number] Arrival date & time: 05/02/24  1444     Patient presents with: Foot Swelling   Christie Anderson is a 48 y.o. female.   Patient is a 48 year old female who presents to the emergency department with a chief complaint of generalized malaise and fatigue, shortness of breath, swelling and pain to the right foot.  Patient notes that she did have a fall approxi-1 week ago but is unsure if she injured her foot.  She notes that over the past week she has been experiencing generalized malaise and fatigue, dyspnea and orthopnea.  She has been compliant with her diuretics.  She denies any associated chest pain, abdominal pain.  She has had some associated nausea without vomiting.  There has been no associated dysuria or hematuria.  She denies any associated weight gain at this point.  She has had no dizziness, lightheadedness or syncope.        Prior to Admission medications  Medication Sig Start Date End Date Taking? Authorizing Provider  Accu-Chek Softclix Lancets lancets Use 3 (three) times daily as directed to check blood sugar. 02/16/24   Fairy Frames, MD  amLODipine (NORVASC) 10 MG tablet Take 50 mg by mouth daily.    [provider]  Blood Glucose Monitoring Suppl (BLOOD GLUCOSE MONITOR SYSTEM) w/Device KIT Use 3 (three) times daily. 02/16/24   Fairy Frames, MD  Continuous Glucose Sensor (FREESTYLE LIBRE 3 PLUS SENSOR) MISC Place 1 sensor on the skin every 15 days. Use to check glucose continuously. 04/04/24   Grooms, Courtney, PA-C  eplerenone  (INSPRA ) 50 MG tablet Take 1 tablet (50 mg total) by mouth daily. 04/22/24   Grooms, Courtney, PA-C  Glucose Blood (BLOOD GLUCOSE TEST STRIPS) STRP Use 3 (three) times daily as directed to check blood sugar. 02/16/24   Fairy Frames, MD  Insulin  Glargine (BASAGLAR  KWIKPEN) 100 UNIT/ML Inject 30 Units into the skin every evening. May substitute as needed  per insurance. 03/07/24   Shahmehdi, Adriana LABOR, MD  insulin  lispro (HUMALOG ) 100 UNIT/ML KwikPen Inject 3 Units into the skin 3 (three) times daily. Pt is taking 4-16 units based on blood sugar (sliding scale) pt does not have scale in front of her to read scale 03/21/24 06/19/24  Mcarthur Pick, MD  Insulin  Pen Needle 32G X 4 MM MISC Use 3 (three) times daily. 02/16/24   Fairy Frames, MD  Insulin  Syringe-Needle U-100 (INSULIN  SYRINGE .5CC/30GX5/16) 30G X 5/16 0.5 ML MISC Use one syringe and needle per insulin  injection. 12/13/23   Grooms, [place], PA-C  Lancet Device MISC 1 each by Does not apply route 3 (three) times daily. May dispense any manufacturer covered by patient's insurance. 02/16/24   Fairy Frames, MD  losartan  (COZAAR ) 50 MG tablet Take 1 tablet (50 mg total) by mouth daily. 03/07/24   ShahmehdiAdriana LABOR, MD  potassium chloride  SA (KLOR-CON  M) 20 MEQ tablet Take 0.5 tablets (10 mEq total) by mouth in the morning and at bedtime. 04/01/24 05/01/24  Marcine Catalan M, PA-C  torsemide  (DEMADEX ) 20 MG tablet Take 2 tablets in the morning and 1 tablet in the evening. 03/21/24   Mcarthur Pick, MD  torsemide  (DEMADEX ) 20 MG tablet Take 2 tablets (40 mg total) by mouth 2 (two) times daily. 03/27/24 06/25/24  Grooms, Courtney, PA-C    Allergies: Bovine (beef) protein-containing drug products, Porcine (pork) protein-containing drug products, Amoxicillin, Doxycycline, and Penicillins    Review of Systems  Constitutional:  Positive for fatigue.  All other systems reviewed and are negative.   Updated Vital Signs BP (!) 153/82 (BP Location: Right Arm)   Pulse (!) 55   Temp 98.1 F (36.7 C) (Oral)   Resp 18   Ht 5' 9 (1.753 m)   Wt 62.4 kg   LMP 12/01/2011   SpO2 99%   BMI 20.31 kg/m   Physical Exam Vitals and nursing note reviewed.  Constitutional:      General: She is not in acute distress.    Appearance: Normal appearance. She is not ill-appearing.  HENT:     Head:  Normocephalic and atraumatic.     Nose: Nose normal.     Mouth/Throat:     Mouth: Mucous membranes are moist.  Eyes:     Extraocular Movements: Extraocular movements intact.     Conjunctiva/sclera: Conjunctivae normal.     Pupils: Pupils are equal, round, and reactive to light.  Cardiovascular:     Rate and Rhythm: Normal rate and regular rhythm.     Pulses: Normal pulses.     Heart sounds: Normal heart sounds. No murmur heard.    No gallop.  Pulmonary:     Effort: Pulmonary effort is normal. No respiratory distress.     Breath sounds: No stridor. Rales present. No wheezing or rhonchi.  Abdominal:     General: Abdomen is flat. Bowel sounds are normal. There is no distension.     Palpations: Abdomen is soft.     Tenderness: There is no abdominal tenderness. There is no guarding.  Musculoskeletal:        General: Normal range of motion.     Cervical back: Normal range of motion and neck supple. No rigidity or tenderness.     Comments: Tender to palpation noted over the right foot with associated edema and ecchymosis, nontender palpation remainder of bilateral lower extremities, mild edema noted to the distal aspect of the right lower extremity, no overlying erythema, warmth, skin breakdown or ulceration, DP and PT pulses are 2+ distally, sensation intact distally  Skin:    General: Skin is warm and dry.     Findings: No rash.  Neurological:     General: No focal deficit present.     Mental Status: She is alert and oriented to person, place, and time. Mental status is at baseline.     Cranial Nerves: No cranial nerve deficit.     Sensory: No sensory deficit.     Motor: No weakness.     Coordination: Coordination normal.     Gait: Gait normal.  Psychiatric:        Mood and Affect: Mood normal.        Behavior: Behavior normal.        Thought Content: Thought content normal.        Judgment: Judgment normal.     (all labs ordered are listed, but only abnormal results are  displayed) Labs Reviewed  CBG MONITORING, ED - Abnormal; Notable for the following components:      Result Value   Glucose-Capillary 187 (*)    All other components within normal limits  COMPREHENSIVE METABOLIC PANEL WITH GFR  CBC WITH DIFFERENTIAL/PLATELET  URINALYSIS, ROUTINE W REFLEX MICROSCOPIC  MAGNESIUM   TROPONIN T, HIGH SENSITIVITY    EKG: None  Radiology: DG Foot Complete Right Result Date: 05/01/2024 CLINICAL DATA:  Trauma to the right foot.  Swelling. EXAM: RIGHT FOOT COMPLETE - 3+ VIEW COMPARISON:  None Available. FINDINGS: Old  fractures of the distal 2nd-4th metatarsals as well as old appearing impacted and displaced fractures of the navicular and cuboid. Probable fractures of the medial and lateral cuneiforms. An acute fracture is not excluded. CT may provide better evaluation. There is no dislocation. There is diffuse soft tissue swelling of the foot. No radiopaque foreign object or subcu gas. IMPRESSION: 1. Old appearing fractures of the distal 2nd-4th metatarsals and tarsal bones. CT may provide better evaluation. 2. Diffuse soft tissue swelling. Electronically Signed   By: Vanetta Chou M.D.   On: 05/01/2024 16:22     .Splint Application  Date/Time: 05/02/2024 6:23 PM  Performed by: Daralene Lonni BIRCH, PA-C Authorized by: Daralene Lonni BIRCH, PA-C   Consent:    Consent obtained:  Verbal   Consent given by:  Patient   Risks, benefits, and alternatives were discussed: yes     Risks discussed:  Discoloration, numbness, pain and swelling   Alternatives discussed:  No treatment and delayed treatment Universal protocol:    Procedure explained and questions answered to patient or proxy's satisfaction: yes     Immediately prior to procedure a time out was called: yes     Patient identity confirmed:  Verbally with patient, arm band, provided demographic data and hospital-assigned identification number Pre-procedure details:    Distal neurologic exam:  Normal    Distal perfusion: distal pulses strong and brisk capillary refill   Procedure details:    Location:  Foot   Foot location:  R foot   Strapping: no     Lower extremity splint type: Bulky Jones splint.   Supplies:  Cotton padding and fiberglass   Attestation: Splint applied and adjusted personally by me   Post-procedure details:    Distal neurologic exam:  Normal   Distal perfusion: distal pulses strong and brisk capillary refill     Procedure completion:  Tolerated well, no immediate complications   Post-procedure imaging: not applicable      Medications Ordered in the ED - No data to display                                  Medical Decision Making Patient is doing well at this time and does remain stable.  Did fully discuss patient CT scan findings of the right foot with Dr. Onesimo with orthopedics who did recommend placing the patient in a bulky Jones splint and having her follow-up first thing in the morning for evaluation in his office.  Patient has no indication for acute arterial insufficiency, DVT, cellulitis or abscess relation at this point.  Blood work is otherwise been unremarkable except for elevated BNP which is not much worse than her baseline at this point.  She has no other clinical indication for fluid overload at this time with no pulmonary edema noted on chest x-ray, weight gain, increased lower extremity edema.  Do not suspect that admission is warranted in this regard.  Chest x-ray demonstrated no indication for pneumonia and urinalysis demonstrates no indication of urinary tract infection.  She has no other obvious indication for infectious source at this point.  Chemistry has been unremarkable.  Do not suspect the patient warrants admission to the hospital service at this time.  She has been placed in the splint by myself and will follow-up with orthopedics first thing in the morning.  She was directed to leave the splint in place until evaluated by orthopedics.  She was  neurovascularly intact distally before and after splint placement.  Strict return precautions were provided for any new or worsening symptoms.  Discussed the need for close follow-up with her primary care doctor on outpatient basis for continued evaluation of her fatigue as well.  Patient has voiced understanding to the plan and had no additional questions. Patient was fully evaluated by attending physician who is in agreement to plan at this time.   Amount and/or Complexity of Data Reviewed Labs: ordered. Radiology: ordered.        Final diagnoses:  None    ED Discharge Orders     None          Daralene Lonni BIRCH, PA-C 05/02/24 1822    Daralene Lonni BIRCH, PA-C 05/02/24 1824    Kammerer, Duwaine CROME, DO 05/07/24 [phone]

## 2024-05-02 NOTE — ED Triage Notes (Signed)
 Pt arrived via POV following recent visit from yesterday to follow-up on abnormal lab results and reports receiving a msg from her PCP through MyChart advising her to return to the ED for further evaluation regarding blood work drawn yesterday and an Xray of her right foot.

## 2024-05-03 ENCOUNTER — Ambulatory Visit: Payer: Self-pay | Admitting: Orthopedic Surgery

## 2024-05-03 ENCOUNTER — Encounter: Payer: Self-pay | Admitting: Orthopedic Surgery

## 2024-05-03 DIAGNOSIS — M79671 Pain in right foot: Secondary | ICD-10-CM

## 2024-05-03 DIAGNOSIS — E11610 Type 2 diabetes mellitus with diabetic neuropathic arthropathy: Secondary | ICD-10-CM

## 2024-05-03 DIAGNOSIS — Z794 Long term (current) use of insulin: Secondary | ICD-10-CM

## 2024-05-03 NOTE — Progress Notes (Signed)
 New Patient Visit  Summary: Christie Anderson is a 48 y.o. 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 Neuropathy contributed to unnoticed injuries and foot deterioration. - Applied cast for stabilization. - Will discuss with Dr. Harden in terms of ongoing management. - Consider follow-up in two weeks   Cast application - Right short leg cast   Verbal consent was obtained and the correct extremity was identified. A well padded, appropriately molded short leg cast was applied to the Right leg Fingers remained warm and well perfused.   There were no sharp edges Patient tolerated the procedure well Cast care instructions were provided       Follow-up: No follow-ups on file.  Subjective:  Chief Complaint  Patient presents with   Foot Injury    Christie Anderson around 12/3 was standing and leg gave way and I fell foot started to swell a little Sat night then when I got up Sunday it was very swollen and red denies pain in foot but has pain in both legs has neuropathy and CHF    Discussed the use of AI scribe software for clinical note transcription with the patient, who gave verbal consent to proceed.      History of Present Illness Christie Anderson is a 48 year old female with diabetes and neuropathy who presents with foot pain and swelling.  She fell about nine days ago, primarily striking her head, and did not notice foot pain at that time. Swelling and bruising of the foot were first noted four days after the fall. She does not recall direct trauma or awkward positioning of the foot.  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  She has no prior injury to this foot and is unsure how the current injury occurred because she has minimal pain due to neuropathy.  A CT of the foot was obtained and results were reviewed with her, but she remains unclear about the severity of the injury given the lack of significant recalled trauma.  She has not taken regular pain medication and only used Tylenol  for the past two nights for discomfort. She has increasing difficulty with self-care and daily activities because of the foot problem and her other health issues, including heart failure.    Review of Systems: No fevers or chills Decreased sensation in her feet No chest pain No shortness of breath No bowel or bladder dysfunction No GI distress No headaches   Medical History:  Past Medical History:  Diagnosis Date   CHF (congestive heart failure) (HCC)    Diabetes (HCC)    Hypertension     Past Surgical History:  Procedure Laterality Date   ENDOMYOCARDIAL BIOPSY N/A 02/16/2024   Procedure: ENDOMYOCARDIAL BIOPSY;  Surgeon: Gardenia Led, DO;  Location: [facility];  Service: Cardiovascular;  Laterality: N/A;   RIGHT HEART CATH N/A 02/16/2024   Procedure: RIGHT HEART CATH;  Surgeon: Gardenia Led, DO;  Location: [facility];  Service: Cardiovascular;  Laterality: N/A;    Family History  Problem Relation Age of Onset   Hypertension Mother    Stroke Mother    Stroke Paternal Grandmother    Social History[1]  Allergies[2]  Active Medications[3]  Objective: LMP 12/01/2011   Physical Exam:    General: Alert and oriented., No acute distress., and Seated in a wheelchair. Gait: Unable to ambulate.  Physical Exam EXTREMITIES: Swelling and bruising present on foot, especially distally. Bruising and redness improve with elevation of foot.  Decreased sensation throughout the right foot.  Normal sensation in the mid tibia.  Toes are warm and well-perfused.   IMAGING: I personally reviewed  images previously obtained from the ED   CT scan of the right foot  IMPRESSION: 1. Dorsal dislocation of the navicular with disruption of the Chopart joint and associated subacute fractures of the medial, middle, and lateral cuneiforms, with pseudoarticulation of the talar head and anterior process of the calcaneus with the lateral cuneiform. 2. Impacted and vertically split cuboid with fracture planes extending to the proximal and distal articular surfaces, subacute with osteoid deposition. 3. Subacute fractures of the distal metaphyses of the 1st, 2nd, 3rd, and 4th metatarsal heads with impaction and minimal angulation. 4. Flexor hallucis longus tenosynovitis proximal to the knot of Henry. 5. Circumferential subcutaneous edema in the ankle and subcutaneous edema of the foot, especially dorsally.    New Medications:  No orders of the defined types were placed in this encounter.     Portions of this note were completed via Scientist, clinical (histocompatibility and immunogenetics).  Christie DELENA Horde, MD  05/03/2024 10:35 AM      [1]  Social History Tobacco Use   Smoking status: Former    Types: Cigarettes    Passive exposure: Current   Smokeless tobacco: Current  Vaping Use   Vaping status: Every Day  Substance Use Topics   Alcohol use: Not Currently   Drug use: Never  [2]  Allergies Allergen Reactions   Bovine (Beef) Protein-Containing Drug Products Hives and Swelling   Porcine (Pork) Protein-Containing Drug Products Hives and Swelling   Amoxicillin Other (See Comments)    Unknown    Doxycycline Nausea And Vomiting   Penicillins Other (See Comments)    Unknown   [3]  Current Meds  Medication Sig   Accu-Chek Softclix Lancets lancets Use 3 (three) times daily as directed to check blood sugar.   amLODipine (NORVASC) 10 MG tablet Take 50 mg by mouth daily.   Blood Glucose Monitoring Suppl (BLOOD GLUCOSE MONITOR SYSTEM) w/Device KIT Use 3 (three) times daily.   Continuous Glucose Sensor  (FREESTYLE LIBRE 3 PLUS SENSOR) MISC Place 1 sensor on the skin every 15 days. Use to check glucose continuously.   eplerenone  (INSPRA ) 50 MG tablet Take 1 tablet (50 mg total) by mouth daily.   Glucose Blood (BLOOD GLUCOSE TEST STRIPS) STRP Use 3 (three) times daily as directed to check blood sugar.   Insulin  Glargine (BASAGLAR  KWIKPEN) 100 UNIT/ML Inject 30 Units into the skin every evening. May substitute as needed per insurance.   insulin  lispro (HUMALOG ) 100 UNIT/ML KwikPen Inject 3 Units into the skin 3 (three) times daily. Pt is taking 4-16 units based on blood sugar (sliding scale) pt does not have scale in front of her to read scale   Insulin  Pen Needle 32G X 4 MM MISC Use 3 (three) times daily.   Insulin  Syringe-Needle U-100 (INSULIN  SYRINGE .5CC/30GX5/16) 30G X 5/16 0.5 ML MISC Use one syringe and needle per insulin  injection.   Lancet Device MISC 1 each by Does not apply route 3 (three) times daily. May dispense any manufacturer covered by patient's insurance.   losartan  (COZAAR ) 50 MG tablet Take 1 tablet (  50 mg total) by mouth daily.   potassium chloride  SA (KLOR-CON  M) 20 MEQ tablet Take 0.5 tablets (10 mEq total) by mouth in the morning and at bedtime.   torsemide  (DEMADEX ) 20 MG tablet Take 2 tablets in the morning and 1 tablet in the evening.   torsemide  (DEMADEX ) 20 MG tablet Take 2 tablets (40 mg total) by mouth 2 (two) times daily.

## 2024-05-05 ENCOUNTER — Emergency Department (HOSPITAL_COMMUNITY): Payer: MEDICAID

## 2024-05-05 ENCOUNTER — Emergency Department (HOSPITAL_COMMUNITY)
Admission: EM | Admit: 2024-05-05 | Discharge: 2024-05-05 | Disposition: A | Discharge status: Home / Self Care | Payer: MEDICAID | Attending: Emergency Medicine | Admitting: Emergency Medicine

## 2024-05-05 ENCOUNTER — Other Ambulatory Visit: Payer: Self-pay

## 2024-05-05 ENCOUNTER — Encounter (HOSPITAL_COMMUNITY): Payer: Self-pay

## 2024-05-05 DIAGNOSIS — Z79899 Other long term (current) drug therapy: Secondary | ICD-10-CM | POA: Insufficient documentation

## 2024-05-05 DIAGNOSIS — I110 Hypertensive heart disease with heart failure: Secondary | ICD-10-CM | POA: Insufficient documentation

## 2024-05-05 DIAGNOSIS — E119 Type 2 diabetes mellitus without complications: Secondary | ICD-10-CM | POA: Insufficient documentation

## 2024-05-05 DIAGNOSIS — Z794 Long term (current) use of insulin: Secondary | ICD-10-CM | POA: Insufficient documentation

## 2024-05-05 DIAGNOSIS — K5289 Other specified noninfective gastroenteritis and colitis: Secondary | ICD-10-CM | POA: Insufficient documentation

## 2024-05-05 DIAGNOSIS — D75839 Thrombocytosis, unspecified: Secondary | ICD-10-CM | POA: Insufficient documentation

## 2024-05-05 DIAGNOSIS — I509 Heart failure, unspecified: Secondary | ICD-10-CM | POA: Insufficient documentation

## 2024-05-05 DIAGNOSIS — K529 Noninfective gastroenteritis and colitis, unspecified: Secondary | ICD-10-CM

## 2024-05-05 LAB — URINALYSIS, ROUTINE W REFLEX MICROSCOPIC
Bacteria, UA: NONE SEEN
Bilirubin Urine: NEGATIVE
Glucose, UA: 50 mg/dL — AB
Ketones, ur: NEGATIVE mg/dL
Leukocytes,Ua: NEGATIVE
Nitrite: NEGATIVE
Protein, ur: >=300 mg/dL — AB
Specific Gravity, Urine: 1.041 — ABNORMAL HIGH (ref 1.005–1.030)
pH: 6 (ref 5.0–8.0)

## 2024-05-05 LAB — COMPREHENSIVE METABOLIC PANEL WITH GFR
ALT: 13 U/L (ref 0–44)
AST: 17 U/L (ref 15–41)
Albumin: 3.4 g/dL — ABNORMAL LOW (ref 3.5–5.0)
Alkaline Phosphatase: 107 U/L (ref 38–126)
Anion gap: 11 (ref 5–15)
BUN: 20 mg/dL (ref 6–20)
CO2: 25 mmol/L (ref 22–32)
Calcium: 8.9 mg/dL (ref 8.9–10.3)
Chloride: 97 mmol/L — ABNORMAL LOW (ref 98–111)
Creatinine, Ser: 0.58 mg/dL (ref 0.44–1.00)
GFR, Estimated: >60 mL/min (ref >60)
Glucose, Bld: 196 mg/dL — ABNORMAL HIGH (ref 70–99)
Potassium: 4 mmol/L (ref 3.5–5.1)
Sodium: 133 mmol/L — ABNORMAL LOW (ref 135–145)
Total Bilirubin: 0.2 mg/dL (ref 0.0–1.2)
Total Protein: 7 g/dL (ref 6.5–8.1)

## 2024-05-05 LAB — CBC WITH DIFFERENTIAL/PLATELET
Abs Immature Granulocytes: 0.03 K/uL (ref 0.00–0.07)
Basophils Absolute: 0.1 K/uL (ref 0.0–0.1)
Basophils Relative: 1 %
Eosinophils Absolute: 0.1 K/uL (ref 0.0–0.5)
Eosinophils Relative: 1 %
HCT: 35.9 % — ABNORMAL LOW (ref 36.0–46.0)
Hemoglobin: 12.1 g/dL (ref 12.0–15.0)
Immature Granulocytes: 0 %
Lymphocytes Relative: 25 %
Lymphs Abs: 2.4 K/uL (ref 0.7–4.0)
MCH: 30 pg (ref 26.0–34.0)
MCHC: 33.7 g/dL (ref 30.0–36.0)
MCV: 89.1 fL (ref 80.0–100.0)
Monocytes Absolute: 0.6 K/uL (ref 0.1–1.0)
Monocytes Relative: 7 %
Neutro Abs: 6.4 K/uL (ref 1.7–7.7)
Neutrophils Relative %: 66 %
Platelets: 581 K/uL — ABNORMAL HIGH (ref 150–400)
RBC: 4.03 MIL/uL (ref 3.87–5.11)
RDW: 11.9 % (ref 11.5–15.5)
WBC: 9.6 K/uL (ref 4.0–10.5)
nRBC: 0 % (ref 0.0–0.2)

## 2024-05-05 MED ORDER — IOHEXOL 300 MG/ML  SOLN
100.0000 mL | Freq: Once | INTRAMUSCULAR | Status: AC | PRN
  Administered 2024-05-05: 100 mL via INTRAVENOUS

## 2024-05-05 MED ORDER — METRONIDAZOLE 500 MG PO TABS
500.0000 mg | ORAL_TABLET | Freq: Once | ORAL | Status: AC
  Administered 2024-05-05: 500 mg via ORAL
  Filled 2024-05-05: qty 1

## 2024-05-05 MED ORDER — CIPROFLOXACIN HCL 500 MG PO TABS: 500.0000 mg | ORAL_TABLET | Freq: Two times a day (BID) | ORAL | 0 refills | Status: AC

## 2024-05-05 MED ORDER — ONDANSETRON HCL 4 MG/2ML IJ SOLN
4.0000 mg | Freq: Once | INTRAMUSCULAR | Status: AC
  Administered 2024-05-05: 4 mg via INTRAVENOUS
  Filled 2024-05-05: qty 2

## 2024-05-05 MED ORDER — CIPROFLOXACIN HCL 250 MG PO TABS
500.0000 mg | ORAL_TABLET | Freq: Once | ORAL | Status: AC
  Administered 2024-05-05: 500 mg via ORAL
  Filled 2024-05-05: qty 2

## 2024-05-05 MED ORDER — PROMETHAZINE HCL 25 MG PO TABS: 25.0000 mg | ORAL_TABLET | Freq: Four times a day (QID) | ORAL | 0 refills | Status: DC | PRN

## 2024-05-05 MED ORDER — SODIUM CHLORIDE 0.9 % IV BOLUS
1000.0000 mL | Freq: Once | INTRAVENOUS | Status: AC
  Administered 2024-05-05: 1000 mL via INTRAVENOUS

## 2024-05-05 MED ORDER — METRONIDAZOLE 500 MG PO TABS: 500.0000 mg | ORAL_TABLET | Freq: Two times a day (BID) | ORAL | 0 refills | Status: DC

## 2024-05-05 NOTE — ED Provider Notes (Signed)
 [place] EMERGENCY DEPARTMENT AT [facility] Provider Note   CSN: [id number] Arrival date & time: 05/05/24  1351     Patient presents with: Fatigue   Christie Anderson is a 48 y.o. female.  {Add pertinent medical, surgical, social history, OB history to YEP:67052} Patient has a history of hypertension diabetes, congestive heart failure and Charcot foot.  Patient states for the last 2 weeks she has been nausea and vomiting and fatigued.  The history is provided by the patient and medical records. No language interpreter was used.  Weakness Severity:  Moderate Onset quality:  Sudden Timing:  Constant Progression:  Worsening Chronicity:  Recurrent Context: not alcohol use   Relieved by:  Nothing Associated symptoms: vomiting   Associated symptoms: no abdominal pain, no chest pain, no cough, no diarrhea, no frequency, no headaches and no seizures        Prior to Admission medications  Medication Sig Start Date End Date Taking? Authorizing Provider  Accu-Chek Softclix Lancets lancets Use 3 (three) times daily as directed to check blood sugar. 02/16/24   Fairy Frames, MD  amLODipine (NORVASC) 10 MG tablet Take 50 mg by mouth daily.    [provider]  Blood Glucose Monitoring Suppl (BLOOD GLUCOSE MONITOR SYSTEM) w/Device KIT Use 3 (three) times daily. 02/16/24   Fairy Frames, MD  Continuous Glucose Sensor (FREESTYLE LIBRE 3 PLUS SENSOR) MISC Place 1 sensor on the skin every 15 days. Use to check glucose continuously. 04/04/24   Grooms, Courtney, PA-C  eplerenone  (INSPRA ) 50 MG tablet Take 1 tablet (50 mg total) by mouth daily. 04/22/24   Grooms, Courtney, PA-C  Glucose Blood (BLOOD GLUCOSE TEST STRIPS) STRP Use 3 (three) times daily as directed to check blood sugar. 02/16/24   Fairy Frames, MD  Insulin  Glargine (BASAGLAR  KWIKPEN) 100 UNIT/ML Inject 30 Units into the skin every evening. May substitute as needed per insurance. 03/07/24   Shahmehdi, Adriana LABOR, MD   insulin  lispro (HUMALOG ) 100 UNIT/ML KwikPen Inject 3 Units into the skin 3 (three) times daily. Pt is taking 4-16 units based on blood sugar (sliding scale) pt does not have scale in front of her to read scale 03/21/24 06/19/24  Mcarthur Pick, MD  Insulin  Pen Needle 32G X 4 MM MISC Use 3 (three) times daily. 02/16/24   Fairy Frames, MD  Insulin  Syringe-Needle U-100 (INSULIN  SYRINGE .5CC/30GX5/16) 30G X 5/16 0.5 ML MISC Use one syringe and needle per insulin  injection. 12/13/23   Grooms, Narka, PA-C  Lancet Device MISC 1 each by Does not apply route 3 (three) times daily. May dispense any manufacturer covered by patient's insurance. 02/16/24   Fairy Frames, MD  losartan  (COZAAR ) 50 MG tablet Take 1 tablet (50 mg total) by mouth daily. 03/07/24   Shahmehdi, Adriana LABOR, MD  potassium chloride  SA (KLOR-CON  M) 20 MEQ tablet Take 0.5 tablets (10 mEq total) by mouth in the morning and at bedtime. 04/01/24 05/03/24  Marcine Catalan M, PA-C  torsemide  (DEMADEX ) 20 MG tablet Take 2 tablets in the morning and 1 tablet in the evening. 03/21/24   Sigdel, Santosh, MD  torsemide  (DEMADEX ) 20 MG tablet Take 2 tablets (40 mg total) by mouth 2 (two) times daily. 03/27/24 06/25/24  Grooms, Courtney, PA-C    Allergies: Bovine (beef) protein-containing drug products, Porcine (pork) protein-containing drug products, Amoxicillin, Doxycycline, and Penicillins    Review of Systems  Constitutional:  Negative for appetite change and fatigue.  HENT:  Negative for congestion, ear discharge and sinus  pressure.   Eyes:  Negative for discharge.  Respiratory:  Negative for cough.   Cardiovascular:  Negative for chest pain.  Gastrointestinal:  Positive for vomiting. Negative for abdominal pain and diarrhea.  Genitourinary:  Negative for frequency and hematuria.  Musculoskeletal:  Negative for back pain.  Skin:  Negative for rash.  Neurological:  Positive for weakness. Negative for seizures and headaches.   Psychiatric/Behavioral:  Negative for hallucinations.     Updated Vital Signs BP 99/69   Pulse 95   Temp 98.4 F (36.9 C) (Oral)   Resp 18   Ht 5' 9 (1.753 m)   Wt 62.1 kg   LMP 12/01/2011   SpO2 100%   BMI 20.23 kg/m   Physical Exam Vitals and nursing note reviewed.  Constitutional:      Appearance: She is well-developed.  HENT:     Head: Normocephalic.     Right Ear: Tympanic membrane normal.     Left Ear: Tympanic membrane normal.     Nose: Nose normal.     Mouth/Throat:     Comments: Dry mucous membrane Eyes:     General: No scleral icterus.    Conjunctiva/sclera: Conjunctivae normal.  Neck:     Thyroid: No thyromegaly.  Cardiovascular:     Rate and Rhythm: Normal rate and regular rhythm.     Heart sounds: No murmur heard.    No friction rub. No gallop.  Pulmonary:     Breath sounds: No stridor. No wheezing or rales.  Chest:     Chest wall: No tenderness.  Abdominal:     General: There is no distension.     Tenderness: There is no abdominal tenderness. There is no rebound.  Musculoskeletal:        General: Normal range of motion.     Cervical back: Neck supple.     Comments: Cast on her right foot  Lymphadenopathy:     Cervical: No cervical adenopathy.  Skin:    Findings: No erythema or rash.  Neurological:     Mental Status: She is alert and oriented to person, place, and time.     Motor: No abnormal muscle tone.     Coordination: Coordination normal.  Psychiatric:        Behavior: Behavior normal.     (all labs ordered are listed, but only abnormal results are displayed) Labs Reviewed  CBC WITH DIFFERENTIAL/PLATELET  COMPREHENSIVE METABOLIC PANEL WITH GFR  URINALYSIS, ROUTINE W REFLEX MICROSCOPIC    EKG: None  Radiology: No results found.  {Document cardiac monitor, telemetry assessment procedure when appropriate:32947} Procedures   Medications Ordered in the ED  sodium chloride  0.9 % bolus 1,000 mL (has no administration in time  range)  ondansetron  (ZOFRAN ) injection 4 mg (has no administration in time range)      {Click here for ABCD2, HEART and other calculators REFRESH Note before signing:1}                              Medical Decision Making Amount and/or Complexity of Data Reviewed Labs: ordered. Radiology: ordered. ECG/medicine tests: ordered.  Risk Prescription drug management.   ***  {Document critical care time when appropriate  Document review of labs and clinical decision tools ie CHADS2VASC2, etc  Document your independent review of radiology images and any outside records  Document your discussion with family members, caretakers and with consultants  Document social determinants of health affecting pt's care  Document your decision making why or why not admission, treatments were needed:32947:::1}   Final diagnoses:  None    ED Discharge Orders     None

## 2024-05-05 NOTE — Discharge Instructions (Signed)
 Your testing shows that you have some inflammation of your colon, this is something that we will need to be treated with antibiotics, I have given you Cipro  and Flagyl , both of these are taken twice a day for 7 days.  Please return to the ER for severe worsening symptoms but be aware that you may have some abdominal pain diarrhea vomiting and even low-grade fevers for the next several days, they should gradually improve, you would need to see your family doctor within 3 days for recheck but come back to the ER for severe or worsening symptoms  Phenergan  was prescribed for nausea

## 2024-05-05 NOTE — ED Triage Notes (Signed)
 Pt c/o fatigue and lack of appetite x several weeks, but increasing over the last few days.  Pt has been seen recently for same.  Pt recently diagnosed w/ Charcot foot and is being followed by orthopaedics.

## 2024-05-05 NOTE — ED Provider Notes (Signed)
 Patient signed out at change of shift, has had some recent nausea and vomiting, has a CBC which is unremarkable except for some thrombocytosis, a metabolic panel which was reassuring with no renal dysfunction or uremia but the CT scan that does show that she has some descending: Sigmoid and rectal inflammatory stranding consistent with proctocolitis.  Patient given Cipro  Flagyl  IV fluids, vital signs totally unremarkable, abdomen nonsurgical  Pt agreeable to plan of d/c with meds for nausea and abx for proctocolitis   Cleotilde Rogue, MD 05/05/24 1757

## 2024-05-07 ENCOUNTER — Ambulatory Visit: Payer: Self-pay

## 2024-05-07 NOTE — Telephone Encounter (Signed)
 FYI Only or Action Required?: Action required by provider: update on patient condition.  Patient was last seen in primary care on 04/22/2024 by Grooms, [place]rena, [place].  Called Nurse Triage reporting Weakness.  Symptoms began several weeks ago.  Symptoms are: gradually worsening.  Triage Disposition: Call EMS 911 Now  Patient/caregiver understands and will follow disposition?: No, wishes to speak with PCP            Copied from CRM #[id number]. Topic: Clinical - Medical Advice >> May 06, 2024  4:26 PM Ashley R wrote: Reason for CRM: Friend calling to inform of ED visit yesterday, proctocolitis.  cant get medicaid, too weak to answer the call, not eating, cath put in. Weak and now great difficulty with standing due to foot fracture. Requesting nurse callback, might not answer [phone]. Social worker, possibly needed or Nursing home. Levorn - [phone] Reason for Disposition  [1] SEVERE weakness (e.g., unable to walk or barely able to walk, requires support) AND [2] new-onset or getting worse  Answer Assessment - Initial Assessment Questions This RN recommends pt goes to ED via ambulance. Pt friend states they don't do anything for her there and refused, stating she was just there. This RN explained why pt needs to be seen due to increasing weakness/frequent falls. Pt friend refused. This RN called [facility] to notify of ED refusal. This RN will send a HP message to clinic. Please call back pt's friend Levorn at [phone].   This RN spoke with pt's friend, Levorn. Pt was seen in ED on 12/14 for proctocolitis; once pt was discharged pt felt like she shouldn't have left Per Levorn, pt was recently diagnosed with heart failure Levorn thinks pt needs to be somewhere where she can get some help- she thinks pt needs to be in a nursing home For last 1-2 weeks, she can't hardly do anything Weakness started a couple of weeks ago and has been intermittent, pt has been falling her legs give  out Struggling to get out of bed and move on her own Extreme fatigue Pt lives with her 32 year old son  Protocols used: Weakness (Generalized) and Fatigue-A-AH

## 2024-05-14 NOTE — Telephone Encounter (Signed)
 Left message to return call

## 2024-05-14 NOTE — Progress Notes (Signed)
 Complex Care Management Note Care Guide Note  05/14/2024 Name: Christie Anderson MRN: [id number] DOB: October 22, 1975   Complex Care Management Outreach Attempts: A second unsuccessful outreach was attempted today to offer the patient with information about available complex care management services.  Follow Up Plan:  Additional outreach attempts will be made to offer the patient complex care management information and services.   Encounter Outcome:  No Answer  Jeoffrey Buffalo , RMA     [place]  [facility], [facility] Guide  Direct Dial : [phone]  Website: [email or web address]

## 2024-05-20 ENCOUNTER — Other Ambulatory Visit (HOSPITAL_BASED_OUTPATIENT_CLINIC_OR_DEPARTMENT_OTHER): Payer: Self-pay

## 2024-05-20 ENCOUNTER — Other Ambulatory Visit: Payer: Self-pay

## 2024-05-21 ENCOUNTER — Other Ambulatory Visit: Payer: Self-pay

## 2024-05-21 ENCOUNTER — Other Ambulatory Visit (HOSPITAL_BASED_OUTPATIENT_CLINIC_OR_DEPARTMENT_OTHER): Payer: Self-pay

## 2024-05-21 NOTE — Progress Notes (Signed)
 Complex Care Management Note Care Guide Note  05/21/2024 Name: Christie Anderson MRN: [id number] DOB: 1975/11/07   Complex Care Management Outreach Attempts: A third unsuccessful outreach was attempted today to offer the patient with information about available complex care management services.  Follow Up Plan:  No further outreach attempts will be made at this time. We have been unable to contact the patient to offer or enroll patient in complex care management services.  Encounter Outcome:  No Answer  Jeoffrey Buffalo , RMA     [place]  [facility], [facility] Guide  Direct Dial : [phone]  Website: [email or web address]

## 2024-05-22 ENCOUNTER — Telehealth: Payer: Self-pay | Admitting: Orthopedic Surgery

## 2024-05-22 NOTE — Telephone Encounter (Signed)
 Called the pt and lvm advising, asked her to cb to schedule.

## 2024-05-22 NOTE — Telephone Encounter (Signed)
 Left a message to return call.

## 2024-05-22 NOTE — Telephone Encounter (Signed)
 Pt seen 05/03/24 and place in a cast. Note says that you would discuss w/ Dr. Harden. Ok to have pt come back or place referral?

## 2024-05-22 NOTE — Telephone Encounter (Signed)
 Dr. Onesimo pt - pt lvm stating she was seen on 05/03/24 and doesn't have a return appointment.  I do not see anything on her AVS, when does she need to come back?  [phone]

## 2024-05-24 NOTE — Telephone Encounter (Signed)
 PT has appointment fr Jan. 6th at 4:00 pm to discuss blood work

## 2024-05-28 ENCOUNTER — Ambulatory Visit (INDEPENDENT_AMBULATORY_CARE_PROVIDER_SITE_OTHER): Payer: Self-pay | Admitting: Physician Assistant

## 2024-05-28 VITALS — BP 109/71 | HR 82 | Wt 128.0 lb

## 2024-05-28 DIAGNOSIS — Z794 Long term (current) use of insulin: Secondary | ICD-10-CM

## 2024-05-28 DIAGNOSIS — E1165 Type 2 diabetes mellitus with hyperglycemia: Secondary | ICD-10-CM

## 2024-05-28 DIAGNOSIS — I5020 Unspecified systolic (congestive) heart failure: Secondary | ICD-10-CM

## 2024-05-28 DIAGNOSIS — R531 Weakness: Secondary | ICD-10-CM

## 2024-05-28 MED ORDER — PROMETHAZINE HCL 25 MG PO TABS: 25.0000 mg | ORAL_TABLET | Freq: Four times a day (QID) | ORAL | 1 refills | Status: AC | PRN

## 2024-05-28 NOTE — Progress Notes (Unsigned)
 "  Established Patient Office Visit  Subjective   Patient ID: Christie Anderson, female    DOB: 1975/09/18  Age: 49 y.o. MRN: [id number]  Chief Complaint  Patient presents with   Extremity Weakness    Pt reports she will just give out, legs and feet and just drop down Pt has broken right foot due to dropping   Anorexia    PT states she gets nauseous, complete loss of appetite and cannot force eat     Discussed the use of AI scribe software for clinical note transcription with the patient, who gave verbal consent to proceed.  History of Present Illness Christie Anderson is a 49 year old female who presents with fatigue, weakness, and poor appetite.  She has had significant fatigue and weakness for about a month, now more constant and severe over the past few days. She is unable to prepare food or perform daily activities. She has very poor appetite, often going days with little to no intake and only managing a few bites when she does eat. She notes recent weight around 128 pounds, down about 10 pounds compared to baseline.   She sometimes has increased shortness of breath, especially when laying down or sleeping, and wakes up feeling she is struggling to breathe. Her oral intake is minimal, limited to small amounts of food and protein drinks, and she drinks little water .  She reports frequent falls and is currently in a cast on the right lower leg due to Charcot's foot, followed by orthopedics. The left leg has been giving way, causing additional falls. She has some mild pain in the left foot after a recent fall and, with neuropathy, she is unsure if she has a new injury.  She has ongoing nausea, diarrhea, and occasional vomiting since an ER visit in December when she was diagnosed with proctocolitis. She denies blood in stool or fevers. Nausea sometimes wakes her from sleep, and she occasionally vomits even when she has not eaten.  She has not been checking blood sugars and has missed insulin   doses because of symptoms and difficulty accessing insulin .    Review of Systems  Constitutional:  Positive for activity change, appetite change and fatigue. Negative for fever.  Respiratory:  Positive for shortness of breath. Negative for cough and wheezing.   Cardiovascular:  Negative for chest pain, palpitations and leg swelling.  Gastrointestinal:  Positive for diarrhea, nausea and vomiting. Negative for blood in stool.  Musculoskeletal:  Positive for gait problem and myalgias. Negative for joint swelling.  Neurological:  Positive for weakness. Negative for dizziness, syncope and light-headedness.       Objective:     LMP 12/01/2011    Physical Exam Constitutional:      General: She is not in acute distress.    Appearance: Normal appearance. She is ill-appearing.  HENT:     Head: Normocephalic and atraumatic.     Mouth/Throat:     Mouth: Mucous membranes are moist.     Pharynx: Oropharynx is clear.  Eyes:     Extraocular Movements: Extraocular movements intact.     Conjunctiva/sclera: Conjunctivae normal.  Cardiovascular:     Rate and Rhythm: Normal rate and regular rhythm.     Heart sounds: Normal heart sounds. No murmur heard. Pulmonary:     Effort: Pulmonary effort is normal.     Breath sounds: Normal breath sounds. No rales.  Musculoskeletal:     Right lower leg: No edema.     Left  lower leg: No edema.  Skin:    General: Skin is warm and dry.  Neurological:     General: No focal deficit present.     Mental Status: She is alert and oriented to person, place, and time.  Psychiatric:        Mood and Affect: Mood normal.        Behavior: Behavior normal.     The ASCVD Risk score (Arnett DK, et al., 2019) failed to calculate for the following reasons:   According to ACC/AHA guidelines, patients with an LDL-C level greater than 190 mg/dL (5.08 mmol/L) should be considered for high-intensity statin therapy. This patient's most recent LDL-C level is 215  mg/dL.    Assessment & Plan:   Return pending lab results, ER if symptoms worsen.   Generalized weakness Assessment & Plan: Persistent symptoms for a month, worsening recently with nocturnal vomiting and ongoing diarrhea. Occasional nocturnal dyspnea, no recent cardiology follow-up since December ER visit.Difficulty managing diabetes due to weakness and mobility issues. Inconsistent insulin  administration. - Ordered blood work including A1c, electrolytes, blood counts, and infection markers. - Refilled promethazine  for nausea. - Continue follow-up with cardiology on January 16th. - Will review lab results tomorrow and determine next steps. - Will consult with Dr. Bluford for further evaluation.  Orders: -     CBC with Differential/Platelet -     Comprehensive metabolic panel with GFR -     Brain natriuretic peptide -     Lipase -     Hemoglobin A1c -     CK -     TSH + free T4  Type 2 diabetes mellitus with hyperglycemia, with long-term current use of insulin  (HCC) -     Hemoglobin A1c  HFrEF (heart failure with reduced ejection fraction) (HCC) -     Brain natriuretic peptide  Other orders -     Promethazine  HCl; Take 1 tablet (25 mg total) by mouth every 6 (six) hours as needed for nausea or vomiting.  Dispense: 25 tablet; Refill: 1    Christie Chojnowski, PA-C  "

## 2024-05-28 NOTE — Telephone Encounter (Signed)
 PT had appointment today, Jan. 6th

## 2024-05-28 NOTE — Telephone Encounter (Signed)
 PT had appointment today

## 2024-05-29 ENCOUNTER — Encounter: Payer: Self-pay | Admitting: Physician Assistant

## 2024-05-29 ENCOUNTER — Ambulatory Visit: Payer: Self-pay | Admitting: Physician Assistant

## 2024-05-29 DIAGNOSIS — R531 Weakness: Secondary | ICD-10-CM | POA: Insufficient documentation

## 2024-05-29 NOTE — Assessment & Plan Note (Signed)
 Persistent symptoms for a month, worsening recently with nocturnal vomiting and ongoing diarrhea. Occasional nocturnal dyspnea, no recent cardiology follow-up since December ER visit.Difficulty managing diabetes due to weakness and mobility issues. Inconsistent insulin  administration. - Ordered blood work including A1c, electrolytes, blood counts, and infection markers. - Refilled promethazine  for nausea. - Continue follow-up with cardiology on January 16th. - Will review lab results tomorrow and determine next steps. - Will consult with Dr. Bluford for further evaluation.

## 2024-05-30 LAB — CBC WITH DIFFERENTIAL/PLATELET
Basophils Absolute: 0.1 x10E3/uL (ref 0.0–0.2)
Basos: 1 %
EOS (ABSOLUTE): 0.1 x10E3/uL (ref 0.0–0.4)
Eos: 1 %
Hematocrit: 39.2 % (ref 34.0–46.6)
Hemoglobin: 12.8 g/dL (ref 11.1–15.9)
Immature Grans (Abs): 0 x10E3/uL (ref 0.0–0.1)
Immature Granulocytes: 0 %
Lymphocytes Absolute: 2.4 x10E3/uL (ref 0.7–3.1)
Lymphs: 28 %
MCH: 30.3 pg (ref 26.6–33.0)
MCHC: 32.7 g/dL (ref 31.5–35.7)
MCV: 93 fL (ref 79–97)
Monocytes Absolute: 0.6 x10E3/uL (ref 0.1–0.9)
Monocytes: 7 %
Neutrophils Absolute: 5.4 x10E3/uL (ref 1.4–7.0)
Neutrophils: 63 %
Platelets: 348 x10E3/uL (ref 150–450)
RBC: 4.23 x10E6/uL (ref 3.77–5.28)
RDW: 12.7 % (ref 11.7–15.4)
WBC: 8.5 x10E3/uL (ref 3.4–10.8)

## 2024-05-30 LAB — COMPREHENSIVE METABOLIC PANEL WITH GFR
ALT: 28 IU/L (ref 0–32)
AST: 23 IU/L (ref 0–40)
Albumin: 3.2 g/dL — ABNORMAL LOW (ref 3.9–4.9)
Alkaline Phosphatase: 157 IU/L — ABNORMAL HIGH (ref 41–116)
BUN/Creatinine Ratio: 26 — ABNORMAL HIGH (ref 9–23)
BUN: 15 mg/dL (ref 6–24)
Bilirubin Total: 0.2 mg/dL (ref 0.0–1.2)
CO2: 26 mmol/L (ref 20–29)
Calcium: 8.9 mg/dL (ref 8.7–10.2)
Chloride: 92 mmol/L — ABNORMAL LOW (ref 96–106)
Creatinine, Ser: 0.57 mg/dL (ref 0.57–1.00)
Globulin, Total: 3.1 g/dL (ref 1.5–4.5)
Glucose: 319 mg/dL — ABNORMAL HIGH (ref 70–99)
Potassium: 4.8 mmol/L (ref 3.5–5.2)
Sodium: 129 mmol/L — ABNORMAL LOW (ref 134–144)
Total Protein: 6.3 g/dL (ref 6.0–8.5)
eGFR: 111 mL/min/1.73

## 2024-05-30 LAB — HEMOGLOBIN A1C
Est. average glucose Bld gHb Est-mCnc: 246 mg/dL
Hgb A1c MFr Bld: 10.2 % — ABNORMAL HIGH (ref 4.8–5.6)

## 2024-05-30 LAB — CK: Total CK: 47 U/L (ref 32–182)

## 2024-05-30 LAB — TSH+FREE T4
Free T4: 1.39 ng/dL (ref 0.82–1.77)
TSH: 4.14 u[IU]/mL (ref 0.450–4.500)

## 2024-05-30 LAB — BRAIN NATRIURETIC PEPTIDE: BNP: 711.9 pg/mL — ABNORMAL HIGH (ref 0.0–100.0)

## 2024-05-30 LAB — LIPASE: Lipase: 6 U/L — ABNORMAL LOW (ref 14–72)

## 2024-06-05 ENCOUNTER — Ambulatory Visit: Payer: Self-pay | Admitting: Orthopedic Surgery

## 2024-06-05 ENCOUNTER — Other Ambulatory Visit: Payer: Self-pay

## 2024-06-05 DIAGNOSIS — E11610 Type 2 diabetes mellitus with diabetic neuropathic arthropathy: Secondary | ICD-10-CM

## 2024-06-05 NOTE — Progress Notes (Signed)
 New Patient Visit  Summary: Christie Anderson is a 49 y.o. 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  Imaging reviewed.  Pathology discussed.  I would like for her to be evaluated by Dr. Harden.  She did not tolerate the cast well, primarily due to swelling.  I have recommended another cast today, however she would prefer a walking boot.  She was fitted for a walking boot.  She was reminded to remain nonweightbearing.     Follow-up: Return for Referral to Dr. Harden.  Subjective:  Chief Complaint  Patient presents with   Foot Pain    R     Discussed the use of AI scribe software for clinical note transcription with the patient, who gave verbal consent to proceed.      History of Present Illness Christie Anderson is a 49 year old female with diabetes and neuropathy who returns with foot pain and swelling.  She continues to have swelling and pain in the right foot.  She states that the cast was very cumbersome.  Her swelling worsened.  This worsens her pain.  It has caused some skin lesions on the contralateral leg due to the rubbing.  She is not interested in another cast.    Review of Systems: No fevers or chills Decreased sensation in her feet No chest pain No shortness of breath No bowel or bladder dysfunction No GI distress No headaches   Objective: LMP 12/01/2011   Physical Exam:    General: Alert and oriented., No acute distress., and Seated in a wheelchair. Gait: Unable to ambulate.  Physical Exam EXTREMITIES: Right foot remains swollen.  There is no bruising.  Minimal redness.  She has limited sensation to the right foot.  Toes warm and well-perfused.   IMAGING: I personally ordered and reviewed the following images   X-rays of the right foot were obtained in clinic today.  These are nonweightbearing views.  These are compared to prior x-rays.   There has been further destruction of the tarsal bones, without acute injury.  Overall alignment of the foot and ankle remains within normal limits.  No bony lesions.  Impression: Midfoot bony changes consistent with Charcot foot.   New Medications:  No orders of the defined types were placed in this encounter.     Portions of this note were completed via Scientist, clinical (histocompatibility and immunogenetics).  [name], MD  06/05/2024 2:41 PM

## 2024-06-05 NOTE — Patient Instructions (Signed)
 As discussed in clinic today, I recommended a walking boot.  Walking boot is for stability only.  I recommend that you do not bear weight on the right foot.  Boot can be removed for hygiene.  We will refer you to Dr. Harden for further evaluation.

## 2024-06-06 ENCOUNTER — Encounter: Payer: Self-pay | Admitting: Orthopedic Surgery

## 2024-06-07 ENCOUNTER — Ambulatory Visit (HOSPITAL_COMMUNITY)
Admission: RE | Admit: 2024-06-07 | Discharge: 2024-06-07 | Disposition: A | Discharge status: Home / Self Care | Payer: MEDICAID | Source: Ambulatory Visit | Attending: Cardiology | Admitting: Cardiology

## 2024-06-07 ENCOUNTER — Encounter (HOSPITAL_COMMUNITY): Payer: Self-pay | Admitting: Cardiology

## 2024-06-07 VITALS — BP 154/82 | HR 95 | Wt 141.8 lb

## 2024-06-07 DIAGNOSIS — I5022 Chronic systolic (congestive) heart failure: Secondary | ICD-10-CM | POA: Insufficient documentation

## 2024-06-07 DIAGNOSIS — F17290 Nicotine dependence, other tobacco product, uncomplicated: Secondary | ICD-10-CM | POA: Insufficient documentation

## 2024-06-07 DIAGNOSIS — Z79899 Other long term (current) drug therapy: Secondary | ICD-10-CM | POA: Insufficient documentation

## 2024-06-07 DIAGNOSIS — I428 Other cardiomyopathies: Secondary | ICD-10-CM | POA: Insufficient documentation

## 2024-06-07 DIAGNOSIS — L539 Erythematous condition, unspecified: Secondary | ICD-10-CM | POA: Insufficient documentation

## 2024-06-07 DIAGNOSIS — E1165 Type 2 diabetes mellitus with hyperglycemia: Secondary | ICD-10-CM | POA: Insufficient documentation

## 2024-06-07 DIAGNOSIS — E119 Type 2 diabetes mellitus without complications: Secondary | ICD-10-CM

## 2024-06-07 DIAGNOSIS — R0601 Orthopnea: Secondary | ICD-10-CM | POA: Insufficient documentation

## 2024-06-07 DIAGNOSIS — I5042 Chronic combined systolic (congestive) and diastolic (congestive) heart failure: Secondary | ICD-10-CM

## 2024-06-07 DIAGNOSIS — E8770 Fluid overload, unspecified: Secondary | ICD-10-CM | POA: Insufficient documentation

## 2024-06-07 MED ORDER — LOSARTAN POTASSIUM 50 MG PO TABS: 75.0000 mg | ORAL_TABLET | Freq: Every day | ORAL | 2 refills | Status: AC

## 2024-06-07 MED ORDER — DOXYCYCLINE HYCLATE 100 MG PO TABS: 100.0000 mg | ORAL_TABLET | Freq: Two times a day (BID) | ORAL | 0 refills | Status: AC

## 2024-06-07 MED ORDER — FUROSEMIDE 80 MG PO TABS: 80.0000 mg | ORAL_TABLET | Freq: Every day | ORAL | 3 refills | Status: AC

## 2024-06-07 NOTE — Patient Instructions (Addendum)
 CHANGE Losartan  to 75 mg daily.  STOP Torsemide   START Lasix  80 mg Twice daily for 2 weeks, then go to 80 mg daily.  START Doxycycline  100 mg Twice daily for 10 days  Your physician recommends that you schedule a follow-up appointment in: 6 weeks.  If you have any questions or concerns before your next appointment please send us  a message through [facility] or call our office at [phone].    TO LEAVE A MESSAGE FOR THE NURSE SELECT OPTION 2, PLEASE LEAVE A MESSAGE INCLUDING: YOUR NAME DATE OF BIRTH CALL BACK NUMBER REASON FOR CALL**this is important as we prioritize the call backs  YOU WILL RECEIVE A CALL BACK THE SAME DAY AS LONG AS YOU CALL BEFORE 4:00 PM  At the [facility], you and your health needs are our priority. As part of our continuing mission to provide you with exceptional heart care, we have created designated Provider Care Teams. These Care Teams include your primary Cardiologist (physician) and Advanced Practice Providers (APPs- Physician Assistants and Nurse Practitioners) who all work together to provide you with the care you need, when you need it.   You may see any of the following providers on your designated Care Team at your next follow up: Dr Toribio Fuel Dr Ezra Shuck Dr. Morene Brownie Greig Mosses, NP Caffie Shed, [place] [facility] [place], [place] Beckey Coe, NP Jordan Lee, NP Ellouise Class, NP Tinnie Redman, PharmD Jaun Bash, PharmD   Please be sure to bring in all your medications bottles to every appointment.    Thank you for choosing [facility]

## 2024-06-09 NOTE — Progress Notes (Signed)
 "  ADVANCED HEART FAILURE FOLLOW UP CLINIC NOTE  Referring Physician: Grooms, [place], PA-C  Primary Care: Grooms, [place], [place] Primary Cardiologist:  HPI: Christie Anderson is a 49 y.o. female who presents for follow up of chronic systolic heart failure.      Admitted 9/25 with acute HFrEF with marked volume overload, weight up ~ 50lbs over 6 months. Echo showed LVEF 35-40% RV mildly reduced. Marked LVH with low volts on EKG. Diuresed with Lasix  gtt and Diamox , overall down 60 lbs. Underwent cMRI showing LVEF 32%, RVEF 39%, nonspecific pattern of LGE with midwall septal scar, seen in [facility]. No definite findings of cardiac amyloidosis. Underwent RHC + endomyocardial biopsy showing severely reduced filling pressures, and severely reduced cardiac index. Biopsy results showed benign myocardium showing mild nonspecific cardial myocyte hyperplasia. Negative for granulomas, inflammation or any other infiltrative process. GDMT titrated and discharged home, weight 120 lbs. readmitted soon afterwards, discharged with a weight of 130 pounds.    Not able to tolerate Furoscix , has been readmitted subsequently.     SUBJECTIVE:  Reports feeling poor today, she has continued orthopnea and is short of breath with more than mild exertion.  She is wearing her boot and is concerned about the warmth and swelling on that leg.  She has been taking her medications as ordered though with occasional increases in her diuretic doses.  More limited by her leg at the moment.    PMH, current medications, allergies, social history, and family history reviewed in epic.  PHYSICAL EXAM: Vitals:   06/07/24 1533  BP: (!) 154/82  Pulse: 95  SpO2: 99%   GENERAL: Well nourished and in no apparent distress at rest.  PULM:  Normal work of breathing, clear to auscultation bilaterally. Respirations are unlabored.  CARDIAC:  JVP: mildly elevated         Normal rate with regular rhythm. No murmurs, rubs or gallops.  1+ LE on  the left, warm and erythema as well. Warm and well perfused extremities. ABDOMEN: Soft, non-tender, non-distended. NEUROLOGIC: Patient is oriented x3 with no focal or lateralizing neurologic deficits.   Data Review:   Ltd echo (02/16/24): EF 35-40% - RHC (9/25): RA 3, PA 17/10 (13), PCWP 6, CO/CI (Fick) 2.28/1.2, PVR 1.8-3 WU, PAPi 2.5 - cMRI (9/25): LVEF 32%, RVEF 39%, nonspecific ECV elevation (32%) - Echo (02/10/24): EF 35-40%, moderate LVH, G1DD, RV mildly reduced,  ASSESSMENT & PLAN:  Chronic HFrEF: Nonischemic, unclear etiology, previous biopsy without evidence of cardiac amyloid consistent with MRI findings. -NYHA class III, hypervolemic today -Did not feel that she has done as well on torsemide , would like to switch back to Lasix  -Stop torsemide , start 80 mg of Lasix  twice daily for 2 weeks then back to 80 mg daily -No SGLT2 with A1c greater than 10 -Continue spironolactone  25 mg daily -Increase losartan  to 75 mg daily, would be great to get on Entresto  in the future - Not advanced therapies candidate at this time   Uncontrolled DM  - Hgb A1C 14.  - No SGLT2i with A1c 14. - Management per PCP.   Lower extremity warmth: Patient with warm and red right lower extremity, given the degree of erythema we will start empiric treatment for cellulitis. -Start doxycycline  100 mg twice daily for 10 days   Tobacco Abuse - Stopped cigarettes, now vapes - Cessation advised.    SDOH - uninsured, Medicaid-pending previously - Engage HFSW to help with resources - Cardiac meds thru HF fund until insurance approved  Follow up in 1 to 2 months  I spent 45 minutes caring for this patient today including face to face time, ordering and reviewing labs, reviewing records from multiple hospitalizations, biopsy reports, seeing the patient, documenting in the record, and arranging follow ups.   Morene Brownie, MD [facility] Mechanical Circulatory Support 06/09/24 "

## 2024-06-19 ENCOUNTER — Encounter: Payer: Self-pay | Admitting: Physician Assistant

## 2024-06-21 ENCOUNTER — Other Ambulatory Visit: Payer: Self-pay | Admitting: Physician Assistant

## 2024-06-21 MED ORDER — GABAPENTIN 100 MG PO CAPS: ORAL_CAPSULE | ORAL | 3 refills | Status: AC

## 2024-06-27 ENCOUNTER — Other Ambulatory Visit: Payer: Self-pay | Admitting: Physician Assistant

## 2024-06-27 DIAGNOSIS — I5020 Unspecified systolic (congestive) heart failure: Secondary | ICD-10-CM

## 2024-07-19 ENCOUNTER — Ambulatory Visit (HOSPITAL_COMMUNITY): Payer: Self-pay

## 2024-07-23 ENCOUNTER — Ambulatory Visit: Payer: Self-pay | Admitting: Physician Assistant
